# Patient Record
Sex: Female | Born: 1995 | Race: Black or African American | Hispanic: No | Marital: Single | Smoking: Current some day smoker
Health system: Southern US, Community
[De-identification: ages and names within clinical notes are randomized; demographics above are authoritative.]

## PROBLEM LIST (undated history)

## (undated) ENCOUNTER — Ambulatory Visit (HOSPITAL_COMMUNITY): Admission: EM | Disposition: A | Discharge status: Home / Self Care | Payer: Medicaid Other

## (undated) ENCOUNTER — Inpatient Hospital Stay (HOSPITAL_COMMUNITY): Payer: Self-pay

## (undated) ENCOUNTER — Emergency Department (HOSPITAL_COMMUNITY): Admission: EM | Payer: Medicaid Other | Source: Home / Self Care

## (undated) DIAGNOSIS — E236 Other disorders of pituitary gland: Secondary | ICD-10-CM

## (undated) DIAGNOSIS — D509 Iron deficiency anemia, unspecified: Secondary | ICD-10-CM

## (undated) DIAGNOSIS — B9689 Other specified bacterial agents as the cause of diseases classified elsewhere: Secondary | ICD-10-CM

## (undated) DIAGNOSIS — Z3480 Encounter for supervision of other normal pregnancy, unspecified trimester: Secondary | ICD-10-CM

## (undated) DIAGNOSIS — K219 Gastro-esophageal reflux disease without esophagitis: Secondary | ICD-10-CM

## (undated) DIAGNOSIS — N760 Acute vaginitis: Secondary | ICD-10-CM

## (undated) HISTORY — PX: WISDOM TOOTH EXTRACTION: SHX21

---
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
## 2014-06-22 ENCOUNTER — Inpatient Hospital Stay (HOSPITAL_COMMUNITY)
Admission: AD | Admit: 2014-06-22 | Discharge: 2014-06-22 | Disposition: A | Discharge status: Home / Self Care | Payer: Medicaid Other | Source: Ambulatory Visit | Attending: Obstetrics and Gynecology | Admitting: Obstetrics and Gynecology

## 2014-06-22 ENCOUNTER — Encounter (HOSPITAL_COMMUNITY): Payer: Self-pay | Admitting: *Deleted

## 2014-06-22 DIAGNOSIS — D649 Anemia, unspecified: Secondary | ICD-10-CM | POA: Diagnosis not present

## 2014-06-22 DIAGNOSIS — Z87891 Personal history of nicotine dependence: Secondary | ICD-10-CM | POA: Insufficient documentation

## 2014-06-22 DIAGNOSIS — N939 Abnormal uterine and vaginal bleeding, unspecified: Secondary | ICD-10-CM | POA: Diagnosis present

## 2014-06-22 DIAGNOSIS — Z975 Presence of (intrauterine) contraceptive device: Secondary | ICD-10-CM

## 2014-06-22 DIAGNOSIS — D509 Iron deficiency anemia, unspecified: Secondary | ICD-10-CM

## 2014-06-22 DIAGNOSIS — N921 Excessive and frequent menstruation with irregular cycle: Secondary | ICD-10-CM

## 2014-06-22 LAB — CBC WITH DIFFERENTIAL/PLATELET
Basophils Absolute: 0 10*3/uL (ref 0.0–0.1)
Basophils Relative: 0 % (ref 0–1)
Eosinophils Absolute: 0.5 10*3/uL (ref 0.0–0.7)
Eosinophils Relative: 7 % — ABNORMAL HIGH (ref 0–5)
HEMATOCRIT: 30.1 % — AB (ref 36.0–46.0)
Hemoglobin: 9.2 g/dL — ABNORMAL LOW (ref 12.0–15.0)
LYMPHS PCT: 29 % (ref 12–46)
Lymphs Abs: 2.1 10*3/uL (ref 0.7–4.0)
MCH: 22 pg — ABNORMAL LOW (ref 26.0–34.0)
MCHC: 30.6 g/dL (ref 30.0–36.0)
MCV: 72 fL — AB (ref 78.0–100.0)
Monocytes Absolute: 0.5 10*3/uL (ref 0.1–1.0)
Monocytes Relative: 7 % (ref 3–12)
Neutro Abs: 4.3 10*3/uL (ref 1.7–7.7)
Neutrophils Relative %: 57 % (ref 43–77)
PLATELETS: 360 10*3/uL (ref 150–400)
RBC: 4.18 MIL/uL (ref 3.87–5.11)
RDW: 16.5 % — ABNORMAL HIGH (ref 11.5–15.5)
WBC: 7.4 10*3/uL (ref 4.0–10.5)

## 2014-06-22 LAB — URINALYSIS, ROUTINE W REFLEX MICROSCOPIC
BILIRUBIN URINE: NEGATIVE
GLUCOSE, UA: NEGATIVE mg/dL
Hgb urine dipstick: NEGATIVE
KETONES UR: NEGATIVE mg/dL
Leukocytes, UA: NEGATIVE
Nitrite: NEGATIVE
Protein, ur: 30 mg/dL — AB
Specific Gravity, Urine: >1.03 — ABNORMAL HIGH (ref 1.005–1.030)
UROBILINOGEN UA: 1 mg/dL (ref 0.0–1.0)
pH: 6 (ref 5.0–8.0)

## 2014-06-22 LAB — URINE MICROSCOPIC-ADD ON

## 2014-06-22 LAB — POCT PREGNANCY, URINE: Preg Test, Ur: NEGATIVE

## 2014-06-22 NOTE — MAU Note (Signed)
Pt menses started a month ago, on and off. Changing 6 pads a day, saturated. Denies vomiting, some nausea. Denies pain.

## 2014-06-22 NOTE — MAU Note (Signed)
Pt feeling light head with headache.

## 2014-06-22 NOTE — Discharge Instructions (Signed)
Iron Deficiency Anemia Anemia is a condition in which there are less red blood cells or hemoglobin in the blood than normal. Hemoglobin is the part of red blood cells that carries oxygen. Iron deficiency anemia is anemia caused by too little iron. It is the most common type of anemia. It may leave you tired and short of breath. CAUSES   Lack of iron in the diet.  Poor absorption of iron, as seen with intestinal disorders.  Intestinal bleeding.  Heavy periods. SIGNS AND SYMPTOMS  Mild anemia may not be noticeable. Symptoms may include:  Fatigue.  Headache.  Pale skin.  Weakness.  Tiredness.  Shortness of breath.  Dizziness.  Cold hands and feet.  Fast or irregular heartbeat. DIAGNOSIS  Diagnosis requires a thorough evaluation and physical exam by your health care provider. Blood tests are generally used to confirm iron deficiency anemia. Additional tests may be done to find the underlying cause of your anemia. These may include:  Testing for blood in the stool (fecal occult blood test).  A procedure to see inside the colon and rectum (colonoscopy).  A procedure to see inside the esophagus and stomach (endoscopy). TREATMENT  Iron deficiency anemia is treated by correcting the cause of the deficiency. Treatment may involve:  Adding iron-rich foods to your diet.  Taking iron supplements. Pregnant or breastfeeding women need to take extra iron because their normal diet usually does not provide the required amount.  Taking vitamins. Vitamin C improves the absorption of iron. Your health care provider may recommend that you take your iron tablets with a glass of orange juice or vitamin C supplement.  Medicines to make heavy menstrual flow lighter.  Surgery. HOME CARE INSTRUCTIONS   Take iron as directed by your health care provider.  If you cannot tolerate taking iron supplements by mouth, talk to your health care provider about taking them through a vein  (intravenously) or an injection into a muscle.  For the best iron absorption, iron supplements should be taken on an empty stomach. If you cannot tolerate them on an empty stomach, you may need to take them with food.  Do not drink milk or take antacids at the same time as your iron supplements. Milk and antacids may interfere with the absorption of iron.  Iron supplements can cause constipation. Make sure to include fiber in your diet to prevent constipation. A stool softener may also be recommended.  Take vitamins as directed by your health care provider.  Eat a diet rich in iron. Foods high in iron include liver, lean beef, whole-grain bread, eggs, dried fruit, and dark green leafy vegetables. SEEK IMMEDIATE MEDICAL CARE IF:   You faint. If this happens, do not drive. Call your local emergency services (911 in [place] if no other help is available.  You have chest pain.  You feel nauseous or vomit.  You have severe or increased shortness of breath with activity.  You feel weak.  You have a rapid heartbeat.  You have unexplained sweating.  You become light-headed when getting up from a chair or bed. MAKE SURE YOU:   Understand these instructions.  Will watch your condition.  Will get help right away if you are not doing well or get worse. Document Released: 02/11/2000 Document Revised: 02/18/2013 Document Reviewed: 10/21/2012 Exit[employer] Patient Information 2015 [employer]. This information is not intended to replace advice given to you by your health care provider. Make sure you discuss any questions you have with your health care provider.  

## 2014-06-22 NOTE — MAU Provider Note (Signed)
History     CSN: [id number]  Arrival date and time: 06/22/14 9767   First Provider Initiated Contact with Patient 06/22/14 1853      Chief Complaint  Patient presents with  . Vaginal Bleeding  . Dizziness   HPI  [name] is a 19 y.o. G1P1001 who presents to MAU today with complaint of intermittent vaginal bleeding x 45 days. The patient states that she has had Nexplanon for birth control since her last pregnancy in 08/2013. She is not currently breastfeeding. She denies pain or LOC. She states intermittent dizziness, weakness and headache.   OB History    Gravida Para Term Preterm AB TAB SAB Ectopic Multiple Living   1 1 1  0 0 0 0 0 0 1      Past Medical History  Diagnosis Date  . GERD (gastroesophageal reflux disease)   . BV (bacterial vaginosis)   . Normal pregnancy 09/17/2013    Past Surgical History  Procedure Laterality Date  . No past surgeries    . Wisdom tooth extraction      Family History  Problem Relation Age of Onset  . Asthma Sister   . Hearing loss Neg Hx     History  Substance Use Topics  . Smoking status: Former Research scientist (life sciences)  . Smokeless tobacco: Never Used     Comment: quit 2011  . Alcohol Use: No    Allergies: No Known Allergies  No prescriptions prior to admission    Review of Systems  Constitutional: Negative for fever and malaise/fatigue.  Gastrointestinal: Negative for abdominal pain.  Genitourinary:       + vaginal bleeding  Neurological: Positive for headaches.   Physical Exam   Blood pressure 112/66, pulse 77, temperature 98.8 F (37.1 C), temperature source Oral, resp. rate 16, weight 139 lb (63.05 kg), last menstrual period 05/05/2014, unknown if currently breastfeeding.  Physical Exam  Constitutional: She is oriented to person, place, and time. She appears well-developed and well-nourished. No distress.  HENT:  Head: Normocephalic.  Cardiovascular: Normal rate.   Respiratory: Effort normal.  GI: Soft. She exhibits  no distension and no mass. There is no tenderness. There is no rebound and no guarding.  Neurological: She is alert and oriented to person, place, and time.  Skin: Skin is warm and dry. No erythema.  Psychiatric: She has a normal mood and affect.   Results for orders placed or performed during the hospital encounter of 06/22/14 (from the past 24 hour(s))  CBC with Differential/Platelet     Status: Abnormal   Collection Time: 06/22/14  7:00 PM  Result Value Ref Range   WBC 7.4 4.0 - 10.5 K/uL   RBC 4.18 3.87 - 5.11 MIL/uL   Hemoglobin 9.2 (L) 12.0 - 15.0 g/dL   HCT 30.1 (L) 36.0 - 46.0 %   MCV 72.0 (L) 78.0 - 100.0 fL   MCH 22.0 (L) 26.0 - 34.0 pg   MCHC 30.6 30.0 - 36.0 g/dL   RDW 16.5 (H) 11.5 - 15.5 %   Platelets 360 150 - 400 K/uL   Neutrophils Relative % 57 43 - 77 %   Lymphocytes Relative 29 12 - 46 %   Monocytes Relative 7 3 - 12 %   Eosinophils Relative 7 (H) 0 - 5 %   Basophils Relative 0 0 - 1 %   Neutro Abs 4.3 1.7 - 7.7 K/uL   Lymphs Abs 2.1 0.7 - 4.0 K/uL   Monocytes Absolute 0.5 0.1 -  1.0 K/uL   Eosinophils Absolute 0.5 0.0 - 0.7 K/uL   Basophils Absolute 0.0 0.0 - 0.1 K/uL   RBC Morphology TARGET CELLS   Pregnancy, urine POC     Status: None   Collection Time: 06/22/14  7:23 PM  Result Value Ref Range   Preg Test, Ur NEGATIVE NEGATIVE     MAU Course  Procedures None  MDM UPT - negative Discussed patient and lab results with Dr. Ulanda Edison. He recommends Ferrous Sulfat 325 mg BID - TID and follow-up in the office  Assessment and Plan  A: Breakthrough bleeding with Nexplanon Anemia  P: Discharge home Patient advised to start OTC Ferrous Sulfate BID-TID  Bleeding precautions discussed Patient advised to follow-up with [facility] to discuss change in birth control if unhappy with current bleeding profile Patient may return to MAU as needed or if her condition were to change or worsen   Luvenia Redden, PA-C  06/22/2014, 7:43 PM

## 2014-06-25 ENCOUNTER — Emergency Department (HOSPITAL_COMMUNITY)
Admission: EM | Admit: 2014-06-25 | Discharge: 2014-06-25 | Disposition: A | Discharge status: Home / Self Care | Payer: Medicaid Other | Attending: Emergency Medicine | Admitting: Emergency Medicine

## 2014-06-25 ENCOUNTER — Encounter (HOSPITAL_COMMUNITY): Payer: Self-pay

## 2014-06-25 DIAGNOSIS — Z87891 Personal history of nicotine dependence: Secondary | ICD-10-CM | POA: Insufficient documentation

## 2014-06-25 DIAGNOSIS — F439 Reaction to severe stress, unspecified: Secondary | ICD-10-CM | POA: Insufficient documentation

## 2014-06-25 DIAGNOSIS — Z8742 Personal history of other diseases of the female genital tract: Secondary | ICD-10-CM | POA: Insufficient documentation

## 2014-06-25 DIAGNOSIS — Z8719 Personal history of other diseases of the digestive system: Secondary | ICD-10-CM | POA: Diagnosis not present

## 2014-06-25 DIAGNOSIS — F410 Panic disorder [episodic paroxysmal anxiety] without agoraphobia: Secondary | ICD-10-CM | POA: Diagnosis present

## 2014-06-25 DIAGNOSIS — F430 Acute stress reaction: Secondary | ICD-10-CM

## 2014-06-25 MED ORDER — LORAZEPAM 1 MG PO TABS
1.0000 mg | ORAL_TABLET | Freq: Once | ORAL | Status: AC
  Administered 2014-06-25: 1 mg via ORAL
  Filled 2014-06-25: qty 1

## 2014-06-25 NOTE — ED Provider Notes (Signed)
CSN: [id number]     Arrival date & time 06/25/14  1718 History   First MD Initiated Contact with Patient 06/25/14 1837     Chief Complaint  Patient presents with  . Panic Attack     (Consider location/radiation/quality/duration/timing/severity/associated sxs/prior Treatment) HPI    PCP: No PCP Per Patient Blood pressure 116/67, pulse 99, temperature 98.2 F (36.8 C), temperature source Oral, resp. rate 18, height 5\' 6"  (1.676 m), weight 139 lb (63.05 kg), last menstrual period 05/05/2014, SpO2 99 %, unknown if currently breastfeeding.  Kimberly Gibson is a 19 y.o.female with a significant PMH of GERD, BV, normal pregnancy 9 months ago  presents to the ER with complaints of panic attacks. 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 She admits to not sleeping very much, eating well or having a break. She does have support from her family members and her significant other but admits that she never lets them help. 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 Once she was home she laid on the cough and her significant other was concerned because she was hyperventilating and unable to verbally respond to his questions. She reports that this is genetic as her mom has panic attacks as well as her sister. The patient was given Ativan in triage and now feels tired but "much better" after having calmed down.   Negative Review of Symptoms: CP, SOB, confusion, lower extremity swelling, nausea, vomiting, diarrhea, cough, abdominal pains, change in vision.      Past Medical History  Diagnosis Date  . GERD (gastroesophageal reflux disease)   . BV (bacterial vaginosis)   . Normal pregnancy 09/17/2013   Past Surgical History  Procedure Laterality Date  . No past surgeries    . Wisdom tooth extraction     Family History  Problem Relation Age of Onset  . Asthma Sister   . Hearing loss Neg Hx    History   Substance Use Topics  . Smoking status: Former Research scientist (life sciences)  . Smokeless tobacco: Never Used     Comment: quit 2011  . Alcohol Use: No   OB History    Gravida Para Term Preterm AB TAB SAB Ectopic Multiple Living   1 1 1  0 0 0 0 0 0 1     Review of Systems  ROS: No TIA's or unusual headaches, no dysphagia.  No prolonged cough. No dyspnea or chest pain on exertion.  No abdominal pain, change in bowel habits, black or bloody stools.  No urinary tract symptoms.  No new or unusual musculoskeletal symptoms.  Normal menses, no abnormal vaginal bleeding, discharge or unexpected pelvic pain. No new breast lumps, breast pain or nipple discharge.   Allergies  Review of patient's allergies indicates no known allergies.  Home Medications   Prior to Admission medications   Medication Sig Start Date End Date Taking? Authorizing Provider  diphenhydrAMINE (BENADRYL) 25 MG tablet Take 25 mg by mouth at bedtime as needed for sleep.    Historical Provider, MD  ferrous sulfate 325 (65 FE) MG tablet Take 1 tablet (325 mg total) by mouth 2 (two) times daily with a meal. Patient not taking: Reported on 06/22/2014 09/20/13   Newton Pigg, MD  ibuprofen (ADVIL,MOTRIN) 600 MG tablet Take 1 tablet (600 mg total) by mouth every 6 (six) hours as needed. Patient not taking: Reported on 06/22/2014 09/20/13   Newton Pigg, MD  MELATONIN PO Take 1 tablet by mouth as needed (sleep).    Historical Provider, MD  oxyCODONE-acetaminophen (PERCOCET/ROXICET) 5-325 MG per tablet Take 1 tablet by mouth every 6 (six) hours as needed for severe pain (moderate - severe pain). Patient not taking: Reported on 06/22/2014 09/20/13   Newton Pigg, MD   BP 116/67 mmHg  Pulse 99  Temp(Src) 98.2 F (36.8 C) (Oral)  Resp 18  Ht 5\' 6"  (1.676 m)  Wt 139 lb (63.05 kg)  BMI 22.45 kg/m2  SpO2 99%  LMP 05/05/2014 (Approximate) Physical Exam  Constitutional: She appears well-developed and well-nourished. No distress.  HENT:  Head:  Normocephalic and atraumatic.  Eyes: Pupils are equal, round, and reactive to light.  Neck: Normal range of motion. Neck supple.  Cardiovascular: Normal rate and regular rhythm.   Pulmonary/Chest: Effort normal.  Abdominal: Soft.  Neurological: She is alert.  Skin: Skin is warm and dry.  Psychiatric: Her mood appears anxious. She is not actively hallucinating. She expresses no homicidal and no suicidal ideation.  Nursing note and vitals reviewed.   ED Course  Procedures (including critical care time) Labs Review Labs Reviewed - No data to display  Imaging Review No results found.   EKG Interpretation None      MDM   Final diagnoses:  Panic attack as reaction to stress   The patient has her significant other and two sisters in the room with her who appear to be very supportive and want to help. The patient is receptive but says she pushes herself because she does not want her child to want for anything. She works at [employer] and has to work a lot of extra shifts to make enough money for what he needs. She admits to not eating or sleeping well.  No SI/HI, no hallucinations or substance abuse concerns. She will be given outpatient resources and referrals. Patient and family members are agreeable to patient going home.  19 y.o.Kimberly Gibson's evaluation in the Emergency Department is complete. It has been determined that no acute conditions requiring further emergency intervention are present at this time. The patient/guardian have been advised of the diagnosis and plan. We have discussed signs and symptoms that warrant return to the ED, such as changes or worsening in symptoms.  Vital signs are stable at discharge. Filed Vitals:   06/25/14 2028  BP: 116/67  Pulse: 99  Temp:   Resp: 18    Patient/guardian has voiced understanding and agreed to follow-up with the PCP or specialist.     Delos Haring, PA-C 06/25/14 2039  Merryl Hacker, MD 06/26/14 1931

## 2014-06-25 NOTE — ED Notes (Signed)
Per EMS, pt appears to have had a panic attack. No trigger.  No hx of same.  Pt states she has not felt well.  Pt has had recently dx anemia and was started on iron.  Pt boyfriend was present during event.  Stated she just started "freaking out".  Denies LOC.  Pt symptoms are of twitching, some verbalization, anxiety.  Vitals:  120 80, hr 80, resp 24 once calmed, 100% ra.  Pt c/o headache.  No seizure activity or postictal noted.

## 2014-06-25 NOTE — ED Notes (Signed)
Bed: WA22 Expected date:  Expected time:  Means of arrival:  Comments: Triage 1  

## 2014-06-25 NOTE — ED Notes (Signed)
Pt reports that she feels better than she did when she came in, asked about her anxiety, she states "I feel much better." Pt received 1 mg of Ativan on arrival.

## 2014-06-25 NOTE — Discharge Instructions (Signed)
Stress and Stress Management °Stress is a normal reaction to life events. It is what you feel when life demands more than you are used to or more than you can handle. Some stress can be useful. For example, the stress reaction can help you catch the last bus of the day, study for a test, or meet a deadline at work. But stress that occurs too often or for too long can cause problems. It can affect your emotional health and interfere with relationships and normal daily activities. Too much stress can weaken your immune system and increase your risk for physical illness. If you already have a medical problem, stress can make it worse. °CAUSES  °All sorts of life events may cause stress. An event that causes stress for one person may not be stressful for another person. Major life events commonly cause stress. These may be positive or negative. Examples include losing your job, moving into a new home, getting married, having a baby, or losing a loved one. Less obvious life events may also cause stress, especially if they occur day after day or in combination. Examples include working long hours, driving in traffic, caring for children, being in debt, or being in a difficult relationship. °SIGNS AND SYMPTOMS °Stress may cause emotional symptoms including, the following: °· Anxiety. This is feeling worried, afraid, on edge, overwhelmed, or out of control. °· Anger. This is feeling irritated or impatient. °· Depression. This is feeling sad, down, helpless, or guilty. °· Difficulty focusing, remembering, or making decisions. °Stress may cause physical symptoms, including the following:  °· Aches and pains. These may affect your head, neck, back, stomach, or other areas of your body. °· Tight muscles or clenched jaw. °· Low energy or trouble sleeping.  °Stress may cause unhealthy behaviors, including the following:  °· Eating to feel better (overeating) or skipping meals. °· Sleeping too little, too much, or both. °· Working  too much or putting off tasks (procrastination). °· Smoking, drinking alcohol, or using drugs to feel better. °DIAGNOSIS  °Stress is diagnosed through an assessment by your health care provider. Your health care provider will ask questions about your symptoms and any stressful life events. Your health care provider will also ask about your medical history and may order blood tests or other tests. Certain medical conditions and medicine can cause physical symptoms similar to stress.  Mental illness can cause emotional symptoms and unhealthy behaviors similar to stress. Your health care provider may refer you to a mental health professional for further evaluation.  °TREATMENT  °Stress management is the recommended treatment for stress. The goals of stress management are reducing stressful life events and coping with stress in healthy ways.  °Techniques for reducing stressful life events include the following: °· Stress identification. Self-monitor for stress and identify what causes stress for you. These skills may help you to avoid some stressful events. °· Time management. Set your priorities, keep a calendar of events, and learn to say "no." These tools can help you avoid making too many commitments. °Techniques for coping with stress include the following: °· Rethinking the problem. Try to think realistically about stressful events rather than ignoring them or overreacting. Try to find the positives in a stressful situation rather than focusing on the negatives. °· Exercise. Physical exercise can release both physical and emotional tension. The key is to find a form of exercise you enjoy and do it regularly. °· Relaxation techniques. These relax the body and mind. Examples include yoga, meditation, tai chi, biofeedback, deep   breathing, progressive muscle relaxation, listening to music, being out in nature, journaling, and other hobbies. Again, the key is to find one or more that you enjoy and can do  regularly.  Healthy lifestyle. Eat a balanced diet, get plenty of sleep, and do not smoke. Avoid using alcohol or drugs to relax.  Strong support network. Spend time with family, friends, or other people you enjoy being around.Express your feelings and talk things over with someone you trust. Counseling or talktherapy with a mental health professional may be helpful if you are having difficulty managing stress on your own. Medicine is typically not recommended for the treatment of stress.Talk to your health care provider if you think you need medicine for symptoms of stress. HOME CARE INSTRUCTIONS  Keep all follow-up visits as directed by your health care provider.  Take all medicines as directed by your health care provider. SEEK MEDICAL CARE IF:  Your symptoms get worse or you start having new symptoms.  You feel overwhelmed by your problems and can no longer manage them on your own. SEEK IMMEDIATE MEDICAL CARE IF:  You feel like hurting yourself or someone else. Document Released: 08/09/2000 Document Revised: 06/30/2013 Document Reviewed: 10/08/2012 [facility] Patient Information 2015 [place], [place]. This information is not intended to replace advice given to you by your health care provider. Make sure you discuss any questions you have with your health care provider.  Panic Attacks Panic attacks are sudden, short-livedsurges of severe anxiety, fear, or discomfort. They may occur for no reason when you are relaxed, when you are anxious, or when you are sleeping. Panic attacks may occur for a number of reasons:   Healthy people occasionally have panic attacks in extreme, life-threatening situations, such as war or natural disasters. Normal anxiety is a protective mechanism of the body that helps [place] react to danger (fight or flight response).  Panic attacks are often seen with anxiety disorders, such as panic disorder, social anxiety disorder, generalized anxiety disorder, and phobias.  Anxiety disorders cause excessive or uncontrollable anxiety. They may interfere with your relationships or other life activities.  Panic attacks are sometimes seen with other mental illnesses, such as depression and posttraumatic stress disorder.  Certain medical conditions, prescription medicines, and drugs of abuse can cause panic attacks. SYMPTOMS  Panic attacks start suddenly, peak within 20 minutes, and are accompanied by four or more of the following symptoms:  Pounding heart or fast heart rate (palpitations).  Sweating.  Trembling or shaking.  Shortness of breath or feeling smothered.  Feeling choked.  Chest pain or discomfort.  Nausea or strange feeling in your stomach.  Dizziness, light-headedness, or feeling like you will faint.  Chills or hot flushes.  Numbness or tingling in your lips or hands and feet.  Feeling that things are not real or feeling that you are not yourself.  Fear of losing control or going crazy.  Fear of dying. Some of these symptoms can mimic serious medical conditions. For example, you may think you are having a heart attack. Although panic attacks can be very scary, they are not life threatening. DIAGNOSIS  Panic attacks are diagnosed through an assessment by your health care provider. Your health care provider will ask questions about your symptoms, such as where and when they occurred. Your health care provider will also ask about your medical history and use of alcohol and drugs, including prescription medicines. Your health care provider may order blood tests or other studies to rule out a serious medical condition.  Your health care provider may refer you to a mental health professional for further evaluation. TREATMENT   Most healthy people who have one or two panic attacks in an extreme, life-threatening situation will not require treatment.  The treatment for panic attacks associated with anxiety disorders or other mental illness  typically involves counseling with a mental health professional, medicine, or a combination of both. Your health care provider will help determine what treatment is best for you.  Panic attacks due to physical illness usually go away with treatment of the illness. If prescription medicine is causing panic attacks, talk with your health care provider about stopping the medicine, decreasing the dose, or substituting another medicine.  Panic attacks due to alcohol or drug abuse go away with abstinence. Some adults need professional help in order to stop drinking or using drugs. HOME CARE INSTRUCTIONS   Take all medicines as directed by your health care provider.   Schedule and attend follow-up visits as directed by your health care provider. It is important to keep all your appointments. SEEK MEDICAL CARE IF:  You are not able to take your medicines as prescribed.  Your symptoms do not improve or get worse. SEEK IMMEDIATE MEDICAL CARE IF:   You experience panic attack symptoms that are different than your usual symptoms.  You have serious thoughts about hurting yourself or others.  You are taking medicine for panic attacks and have a serious side effect. MAKE SURE YOU:  Understand these instructions.  Will watch your condition.  Will get help right away if you are not doing well or get worse. Document Released: 02/13/2005 Document Revised: 02/18/2013 Document Reviewed: 09/27/2012 [facility] Patient Information 2015 [place], [place]. This information is not intended to replace advice given to you by your health care provider. Make sure you discuss any questions you have with your health care provider.  RESOURCE GUIDE  Chronic Pain Problems: Contact [facility]  [phone] Patients need to be referred by their primary care doctor.  Insufficient Money for Medicine: Contact [employer]:  call "211" or [place] [phone].  No Primary Care Doctor: Call  [facility] Connect  [phone] - can help you locate a primary care doctor that  accepts your insurance, provides certain services, etc. Physician Referral Service- [phone]  Agencies that provide inexpensive medical care: [facility]  [facility]  [phone] [facility]  [phone] [facility]  [phone] Planned Parenthood  [phone] [facility]  [phone]  [place] Providers: [facility]- [address]  [phone], Mon-Fri 9am-7pm, Sat 9am-1pm [place], [address] [place]  [place], [address] [place]  [place]- [address]  [place], [address], [phone]  Only accepts [place] patients after they have their name  applied to their card  Self Pay (no insurance) in [facility]: Sickle Cell Patients: Dr Kevan Ny, [facility]  [place], [facility]- [facility] [place]- [address], [facility]- see information above (Speak to [employer] if you do not have insurance)       -  [facility]- [place], [place],  [place], [place]  Dr Vista Lawman-  [address], [place], [facility]- [address] [phone]       -  [facility] [place]- [address], [place], also [address], [phone]       -    [facility]- [address], [place], 1st & 3rd Saturday   every month, 10am-1pm  1) Find a Doctor and Pay Out of Pocket Although you won't have to find out who  is covered by your insurance plan, it is a good idea to ask around and get recommendations. You will then need to call the office and see if the doctor you have chosen will accept you as a new patient and what types of options they offer for patients who are self-pay. Some doctors offer discounts or will set up payment plans for their patients who do not have insurance, but you will need to ask so you aren't surprised when you get to your appointment.  2) Contact Your Local Health Department Not all health departments have doctors that can see patients for sick visits, but many do, so it is worth a call to see if yours does. If you don't know where your local health department is, you can check in your phone book. The CDC also has a tool to help you locate your state's health department, and many state websites also have listings of all of their local health departments.  3) Find a [facility] If your illness is not likely to be very severe or complicated, you may want to try a walk in clinic. These are popping up all over the country in pharmacies, drugstores, and shopping centers. They're usually staffed by nurse practitioners or physician assistants that have been trained to treat common illnesses and complaints. They're usually fairly quick and inexpensive. However, if you have serious medical issues or chronic medical problems, these are probably not your best option  [facility] [place], [facility], [address] [place], phone [phone] or [phone].  Monday - Friday, call for an appointment. [place], [facility], [address], [place], phone [phone] or [phone].  Monday - Friday, call for an appointment.  Abuse/Neglect: [place] [phone] [place] [phone] (After Hours)  Emergency Shelter:  [facility] Ever[facility]  Ministries [phone]  Maternity Homes: Room at the [place] [phone] [place] [phone]  MRSA Hotline #:   620-447-9230  [facility] Dept. [address]         [place]  Sela Hua Phone:  [phone]                                  Phone:  [phone]                   Phone:  [place], [place]- [phone]       -     [facility] in [place], [address]                                  [phone], [place] [phone] or [phone] (After Hours)   [place]  Substance Abuse Resources: Alcohol and Drug Services  [phone] [place] [phone] [facility]a Pester [phone] Residential & Outpatient Substance Abuse Program  [phone]  Psychological Services: [place]  [phone] [place]  [place], [place]. [address] [place], [place]: [phone] or [phone], [email or web address]  Dental Assistance  If unable to pay or uninsured, contact:  [facility] or [facility]. to become qualified for the adult dental clinic.  Patients with Medicaid: [facility] [phone], [place] [address], [phone]  If unable to pay, or uninsured, contact [facility] [phone]) or [place] [phone] in [place], [place] in [facility]) to become qualified for the adult dental clinic  Other [place]- [place], [place], [place], [address], [place], [place], 2nd and 4th Thursday of the month at 6:30am.  10 clients each day by appointment, can sometimes see walk-in patients if someone does not show for an appointment. [facility]- [address] Hillard Danker [place], [place], [address], [place], [place], [place], [address], [place] [phone]  Please make every effort to establish with a primary care physician for routine medical care  [place]  The [place] provides a wide range of adult health services. Some of these services are designed to address the healthcare needs of all [facility] residents and all services are designed to meet the needs of uninsured/underinsured low income residents. Some services are available to any resident of [place], call [phone] for details. ] [facility], a new medical clinic for adults, is now open. For more information about the Center and its services please call [phone]. For information on our [place] services, click here.  For more information on any of the following Department of Public Health programs, including hours of service, click on the highlighted link.  SERVICES FOR WOMEN (Adults and Teens) [employer] provide a full range of birth control options plus education and counseling. New patient visit and annual return visits include a complete examination, pap test as indicated, and other laboratory as indicated. Included is our [employer] for men.  Maternity Care is provided through pregnancy, including a six week post partum  exam. Women who meet eligibility criteria for the Medicaid for Pregnant Women program, receive care  free. Other women are charged on a sliding scale according to income. Note: [facility] provides services to pregnant women who have a Medicaid card. Call [phone] for an appointment in [place] or [phone] for an appointment in [facility].  Primary Care for Medicaid [place] Access Women is available through the [place]. As primary care provider for the [place] program, women may designate the [facility] clinic as their primary care provider.  PLEASE CALL [phone] FOR AN APPOINTMENT FOR THE ABOVE SERVICES IN EITHER [place] OR [place]. Information available in [place] and [place].   Childbirth Education Classes are open to the public and offered to help families prepare for the best possible childbirth experience as well as to promote lifelong health and wellness. Classes are offered throughout the year and meet on the same night once a week for five weeks. Medicaid covers the cost of the classes for the mother-to-be and her partner. For participants without Medicaid, the cost of the class series is $45.00 for the mother-to-be and her partner. Class size is limited and registration is required. For more information or to register call [phone]. Baby items donated by [employer] and the [employer] [place] are given away during each class series.  SERVICES FOR WOMEN AND MEN Sexually Transmitted Infection appointments, including HIV testing, are available daily (weekdays, except holidays). Call early as same-day appointments are limited. For an appointment in either [facility] or [place], call [phone]. Services are confidential and free of charge.  Skin Testing for Tuberculosis Please call [phone]. Adult Immunizations are available, usually for a fee. Please call [phone] for details.  PLEASE CALL [phone] FOR AN APPOINTMENT FOR THE ABOVE SERVICES IN EITHER [place] OR [place].   [facility] provides up to the minute recommended vaccines for your travel destination. We also provide essential health and political information to help insure a safe and pleasurable travel experience. This program is self-sustaining, however, fees are very competitive. We are a CERTIFIED YELLOW FEVER IMMUNIZATION approved clinic site. PLEASE CALL [phone] FOR AN APPOINTMENT IN EITHER [place] OR [place].   If you have questions about the services listed above, we want to answer them! Email [place] at: [email or web address] Home Visiting Services for elderly and the disabled are available to residents of [facility] who are in need of care that compares to the care offered by a nursing home, have needs that can be met by the program, and have [place] Medicaid. Other short term services are available to residents 18 years and older who are unable to meet requirements for eligibility to receive services from a certified home health agency, spend the majority of time at home, and need care for six months or less.  PLEASE CALL [id number] OR [phone] FOR MORE INFORMATION. Medication Assistance Program serves as a link between pharmaceutical companies and patients to provide low cost or free prescription medications. This servce is available for residents who meet certain income restrictions and have no insurance coverage.  PLEASE CALL [phone] ([place]) OR [phone] ([place]) FOR MORE INFORMATION.  Updated Feb. 21, 2013

## 2014-12-23 ENCOUNTER — Encounter (HOSPITAL_COMMUNITY): Payer: Self-pay | Admitting: *Deleted

## 2014-12-23 ENCOUNTER — Emergency Department (HOSPITAL_COMMUNITY): Payer: Medicaid Other

## 2014-12-23 ENCOUNTER — Emergency Department (HOSPITAL_COMMUNITY)
Admission: EM | Admit: 2014-12-23 | Discharge: 2014-12-23 | Disposition: A | Discharge status: Home / Self Care | Payer: Medicaid Other | Attending: Emergency Medicine | Admitting: Emergency Medicine

## 2014-12-23 DIAGNOSIS — M545 Low back pain: Secondary | ICD-10-CM | POA: Diagnosis not present

## 2014-12-23 DIAGNOSIS — Z3202 Encounter for pregnancy test, result negative: Secondary | ICD-10-CM | POA: Diagnosis not present

## 2014-12-23 DIAGNOSIS — Z8719 Personal history of other diseases of the digestive system: Secondary | ICD-10-CM | POA: Insufficient documentation

## 2014-12-23 DIAGNOSIS — Z8742 Personal history of other diseases of the female genital tract: Secondary | ICD-10-CM | POA: Diagnosis not present

## 2014-12-23 DIAGNOSIS — Z87891 Personal history of nicotine dependence: Secondary | ICD-10-CM | POA: Insufficient documentation

## 2014-12-23 DIAGNOSIS — R079 Chest pain, unspecified: Secondary | ICD-10-CM

## 2014-12-23 DIAGNOSIS — R1084 Generalized abdominal pain: Secondary | ICD-10-CM | POA: Insufficient documentation

## 2014-12-23 DIAGNOSIS — R51 Headache: Secondary | ICD-10-CM | POA: Diagnosis not present

## 2014-12-23 DIAGNOSIS — R519 Headache, unspecified: Secondary | ICD-10-CM

## 2014-12-23 LAB — URINALYSIS, ROUTINE W REFLEX MICROSCOPIC
Bilirubin Urine: NEGATIVE
Glucose, UA: NEGATIVE mg/dL
HGB URINE DIPSTICK: NEGATIVE
Ketones, ur: NEGATIVE mg/dL
Leukocytes, UA: NEGATIVE
Nitrite: NEGATIVE
PROTEIN: NEGATIVE mg/dL
Specific Gravity, Urine: 1.027 (ref 1.005–1.030)
Urobilinogen, UA: 1 mg/dL (ref 0.0–1.0)
pH: 7.5 (ref 5.0–8.0)

## 2014-12-23 LAB — COMPREHENSIVE METABOLIC PANEL
ALBUMIN: 3.4 g/dL — AB (ref 3.5–5.0)
ALT: 10 U/L — ABNORMAL LOW (ref 14–54)
ANION GAP: 7 (ref 5–15)
AST: 20 U/L (ref 15–41)
Alkaline Phosphatase: 71 U/L (ref 38–126)
BUN: 9 mg/dL (ref 6–20)
CALCIUM: 9.2 mg/dL (ref 8.9–10.3)
CO2: 26 mmol/L (ref 22–32)
CREATININE: 0.62 mg/dL (ref 0.44–1.00)
Chloride: 107 mmol/L (ref 101–111)
GFR calc non Af Amer: >60 mL/min (ref >60)
Glucose, Bld: 117 mg/dL — ABNORMAL HIGH (ref 65–99)
POTASSIUM: 3.7 mmol/L (ref 3.5–5.1)
Sodium: 140 mmol/L (ref 135–145)
Total Bilirubin: 0.3 mg/dL (ref 0.3–1.2)
Total Protein: 7.3 g/dL (ref 6.5–8.1)

## 2014-12-23 LAB — URINE MICROSCOPIC-ADD ON

## 2014-12-23 LAB — CBC
HCT: 30.7 % — ABNORMAL LOW (ref 36.0–46.0)
HEMOGLOBIN: 9.3 g/dL — AB (ref 12.0–15.0)
MCH: 21.4 pg — AB (ref 26.0–34.0)
MCHC: 30.3 g/dL (ref 30.0–36.0)
MCV: 70.6 fL — ABNORMAL LOW (ref 78.0–100.0)
PLATELETS: 427 10*3/uL — AB (ref 150–400)
RBC: 4.35 MIL/uL (ref 3.87–5.11)
RDW: 16 % — AB (ref 11.5–15.5)
WBC: 8.4 10*3/uL (ref 4.0–10.5)

## 2014-12-23 LAB — I-STAT BETA HCG BLOOD, ED (MC, WL, AP ONLY): I-stat hCG, quantitative: <5 m[IU]/mL (ref <5)

## 2014-12-23 LAB — LIPASE, BLOOD: Lipase: 33 U/L (ref 11–51)

## 2014-12-23 LAB — D-DIMER, QUANTITATIVE: D-Dimer, Quant: 0.27 ug/mL-FEU (ref 0.00–0.48)

## 2014-12-23 NOTE — ED Notes (Signed)
Completed EKG, unable to find Dr to hand EKG off too. Tech Katie will hand off EKG.

## 2014-12-23 NOTE — ED Notes (Signed)
Pt has multiple complaints. Reports not feeling well x 2 months. Having chest pains, lower back pain, abd pain, nausea, headaches, bodyaches. Denies diarrhea, vomiting or recent cough. No acute distress noted at triage.

## 2014-12-23 NOTE — Discharge Instructions (Signed)
Abdominal Pain, Adult Many things can cause abdominal pain. Usually, abdominal pain is not caused by a disease and will improve without treatment. It can often be observed and treated at home. Your health care provider will do a physical exam and possibly order blood tests and X-rays to help determine the seriousness of your pain. However, in many cases, more time must pass before a clear cause of the pain can be found. Before that point, your health care provider may not know if you need more testing or further treatment. HOME CARE INSTRUCTIONS Monitor your abdominal pain for any changes. The following actions may help to alleviate any discomfort you are experiencing:  Only take over-the-counter or prescription medicines as directed by your health care provider.  Do not take laxatives unless directed to do so by your health care provider.  Try a clear liquid diet (broth, tea, or water) as directed by your health care provider. Slowly move to a bland diet as tolerated. SEEK MEDICAL CARE IF:  You have unexplained abdominal pain.  You have abdominal pain associated with nausea or diarrhea.  You have pain when you urinate or have a bowel movement.  You experience abdominal pain that wakes you in the night.  You have abdominal pain that is worsened or improved by eating food.  You have abdominal pain that is worsened with eating fatty foods.  You have a fever. SEEK IMMEDIATE MEDICAL CARE IF:  Your pain does not go away within 2 hours.  You keep throwing up (vomiting).  Your pain is felt only in portions of the abdomen, such as the right side or the left lower portion of the abdomen.  You pass bloody or black tarry stools. MAKE SURE YOU:  Understand these instructions.  Will watch your condition.  Will get help right away if you are not doing well or get worse.   This information is not intended to replace advice given to you by your health care provider. Make sure you discuss  any questions you have with your health care provider.   Document Released: 11/23/2004 Document Revised: 11/04/2014 Document Reviewed: 10/23/2012 Elsevier Interactive Patient Education 2016 Elsevier [employer]  Chest Wall Pain Chest wall pain is pain in or around the bones and muscles of your chest. Sometimes, an injury causes this pain. Sometimes, the cause may not be known. This pain may take several weeks or longer to get better. HOME CARE INSTRUCTIONS  Pay attention to any changes in your symptoms. Take these actions to help with your pain:   Rest as told by your health care provider.   Avoid activities that cause pain. These include any activities that use your chest muscles or your abdominal and side muscles to lift heavy items.   If directed, apply ice to the painful area:  Put ice in a plastic bag.  Place a towel between your skin and the bag.  Leave the ice on for 20 minutes, 2-3 times per day.  Take over-the-counter and prescription medicines only as told by your health care provider.  Do not use tobacco products, including cigarettes, chewing tobacco, and e-cigarettes. If you need help quitting, ask your health care provider.  Keep all follow-up visits as told by your health care provider. This is important. SEEK MEDICAL CARE IF:  You have a fever.  Your chest pain becomes worse.  You have new symptoms. SEEK IMMEDIATE MEDICAL CARE IF:  You have nausea or vomiting.  You feel sweaty or light-headed.  You have  a cough with phlegm (sputum) or you cough up blood.  You develop shortness of breath.   This information is not intended to replace advice given to you by your health care provider. Make sure you discuss any questions you have with your health care provider.   Document Released: 02/13/2005 Document Revised: 11/04/2014 Document Reviewed: 05/11/2014 Elsevier Interactive Patient Education 2016 [place] Headache Without Cause A headache is pain  or discomfort felt around the head or neck area. The specific cause of a headache may not be found. There are many causes and types of headaches. A few common ones are:  Tension headaches.  Migraine headaches.  Cluster headaches.  Chronic daily headaches. HOME CARE INSTRUCTIONS  Watch your condition for any changes. Take these steps to help with your condition: Managing Pain  Take over-the-counter and prescription medicines only as told by your health care provider.  Lie down in a dark, quiet room when you have a headache.  If directed, apply ice to the head and neck area:  Put ice in a plastic bag.  Place a towel between your skin and the bag.  Leave the ice on for 20 minutes, 2-3 times per day.  Use a heating pad or hot shower to apply heat to the head and neck area as told by your health care provider.  Keep lights dim if bright lights bother you or make your headaches worse. Eating and Drinking  Eat meals on a regular schedule.  Limit alcohol use.  Decrease the amount of caffeine you drink, or stop drinking caffeine. General Instructions  Keep all follow-up visits as told by your health care provider. This is important.  Keep a headache journal to help find out what may trigger your headaches. For example, write down:  What you eat and drink.  How much sleep you get.  Any change to your diet or medicines.  Try massage or other relaxation techniques.  Limit stress.  Sit up straight, and do not tense your muscles.  Do not use tobacco products, including cigarettes, chewing tobacco, or e-cigarettes. If you need help quitting, ask your health care provider.  Exercise regularly as told by your health care provider.  Sleep on a regular schedule. Get 7-9 hours of sleep, or the amount recommended by your health care provider. SEEK MEDICAL CARE IF:   Your symptoms are not helped by medicine.  You have a headache that is different from the usual  headache.  You have nausea or you vomit.  You have a fever. SEEK IMMEDIATE MEDICAL CARE IF:   Your headache becomes severe.  You have repeated vomiting.  You have a stiff neck.  You have a loss of vision.  You have problems with speech.  You have pain in the eye or ear.  You have muscular weakness or loss of muscle control.  You lose your balance or have trouble walking.  You feel faint or pass out.  You have confusion.   This information is not intended to replace advice given to you by your health care provider. Make sure you discuss any questions you have with your health care provider.   Document Released: 02/13/2005 Document Revised: 11/04/2014 Document Reviewed: 06/08/2014 Elsevier Interactive Patient Education [employer].

## 2014-12-23 NOTE — ED Provider Notes (Signed)
Arrival Date & Time: 12/23/14 & 1608  History   Chief Complaint  Patient presents with  . Abdominal Pain  . Headache  . Back Pain   HPI Kimberly Gibson is a 19 y.o. female with a chief complaint of Abdominal Pain; Headache; and Back Pain  States abdominal pain that is described as generalized and without emesis or diarrhea. Abdominal pain is localized to umbilicus at this time the headache has been present for 2 days associated with sudden onset of symptoms nor are symptoms worsen onset. Patient has no constitutional symptoms such as fevers or chills or neck pain and no increased sensitivity to light. Patient states she has intermittent stabbing chest pain.    Past Medical History  I reviewed & agree with nursing's documentation on PMHx, PSHx, SHx and FHx. Past Medical History  Diagnosis Date  . GERD (gastroesophageal reflux disease)   . BV (bacterial vaginosis)   . Normal pregnancy 09/17/2013   Past Surgical History  Procedure Laterality Date  . No past surgeries    . Wisdom tooth extraction     Social History   Social History  . Marital Status: Single    Spouse Name: N/A  . Number of Children: N/A  . Years of Education: N/A   Social History Main Topics  . Smoking status: Former Research scientist (life sciences)  . Smokeless tobacco: Never Used     Comment: quit 2011  . Alcohol Use: No  . Drug Use: No  . Sexual Activity: Yes    Birth Control/ Protection: Implant   Other Topics Concern  . None   Social History Narrative   Family History  Problem Relation Age of Onset  . Asthma Sister   . Hearing loss Neg Hx     Review of Systems  Complete ROS obtained and pertinent positive and negatives documented above in HPI. All other ROS negative.  Allergies  Review of patient's allergies indicates no known allergies.  Home Medications   Prior to Admission medications   Medication Sig Start Date End Date Taking? Authorizing Provider  ferrous sulfate 325 (65 FE) MG tablet Take 1 tablet (325 mg  total) by mouth 2 (two) times daily with a meal. Patient not taking: Reported on 06/22/2014 09/20/13   Newton Pigg, MD  ibuprofen (ADVIL,MOTRIN) 600 MG tablet Take 1 tablet (600 mg total) by mouth every 6 (six) hours as needed. Patient not taking: Reported on 06/22/2014 09/20/13   Newton Pigg, MD  oxyCODONE-acetaminophen (PERCOCET/ROXICET) 5-325 MG per tablet Take 1 tablet by mouth every 6 (six) hours as needed for severe pain (moderate - severe pain). Patient not taking: Reported on 06/22/2014 09/20/13   Newton Pigg, MD    Physical Exam  BP 117/62 mmHg  Pulse 104  Temp(Src) 98.6 F (37 C) (Oral)  Resp 24  SpO2 100% Physical Exam  Constitutional: She is oriented to person, place, and time. She appears well-developed and well-nourished.  Non-toxic appearance. She does not appear ill. No distress.  HENT:  Head: Normocephalic and atraumatic.  Right Ear: External ear normal.  Left Ear: External ear normal.  Eyes: Pupils are equal, round, and reactive to light. No scleral icterus.  Neck: Normal range of motion. Neck supple. No tracheal deviation present.  Cardiovascular: Normal heart sounds and intact distal pulses.   No murmur heard. Pulmonary/Chest: Effort normal and breath sounds normal. No stridor. No respiratory distress. She has no wheezes. She has no rales.  Abdominal: Soft. Bowel sounds are normal. She exhibits no distension. There  is no tenderness. There is no rebound and no guarding.  Musculoskeletal: Normal range of motion.  Neurological: She is alert and oriented to person, place, and time. She has normal strength and normal reflexes. No cranial nerve deficit or sensory deficit.  Skin: Skin is warm and dry. No pallor.  Psychiatric: She has a normal mood and affect. Her behavior is normal.    ED Course  Procedures Labs Review Labs Reviewed  COMPREHENSIVE METABOLIC PANEL - Abnormal; Notable for the following:    Glucose, Bld 117 (*)    Albumin 3.4 (*)    ALT 10 (*)     All other components within normal limits  CBC - Abnormal; Notable for the following:    Hemoglobin 9.3 (*)    HCT 30.7 (*)    MCV 70.6 (*)    MCH 21.4 (*)    RDW 16.0 (*)    Platelets 427 (*)    All other components within normal limits  URINALYSIS, ROUTINE W REFLEX MICROSCOPIC (NOT AT [facility]) - Abnormal; Notable for the following:    APPearance TURBID (*)    All other components within normal limits  LIPASE, BLOOD  D-DIMER, QUANTITATIVE (NOT AT [facility])  URINE MICROSCOPIC-ADD ON  I-STAT BETA HCG BLOOD, ED ([facility], [facility], [facility] ONLY)    Imaging Review Dg Chest Portable 1 View  12/23/2014  CLINICAL DATA:  19 year old female with acute left chest pain for 2 weeks. EXAM: PORTABLE CHEST 1 VIEW COMPARISON:  None. FINDINGS: The cardiomediastinal silhouette is unremarkable. There is no evidence of focal airspace disease, pulmonary edema, suspicious pulmonary nodule/mass, pleural effusion, or pneumothorax. No acute bony abnormalities are identified. IMPRESSION: No active disease. Electronically Signed   By: Margarette Canada M.D.   On: 12/23/2014 19:04    Laboratory and Imaging results were personally reviewed by myself and used in the medical decision making of this patient's treatment and disposition.  EKG Interpretation  EKG Interpretation  Date/Time:    Ventricular Rate:    PR Interval:    QRS Duration:   QT Interval:    QTC Calculation:   R Axis:     Text Interpretation:        MDM  Kimberly Gibson is a 19 y.o. female with H&P as above. ED clinical course as follows:  Patient is mechanically stable afebrile and nontoxic on physical examination. Patient's abdominal exam benign reveals no concerns for peritonitis or involuntary rebound. Patient endorses no back pain on physical exam therefore do not suspect pyelonephritis as etiology. The patient has no concerning CP unremarkable and there are no LFT had maladies effort do not suspect gallbladder or liver or common bile duct or pancreatic pathology at  this time as lipase is within normal limits. Patient has hemoglobin at baseline and no leukocytosis on CBC. Given patient had tachycardia I obtained d-dimer due to concerns for pulmonary embolism however this is negative and within normal limits therefore do not suspect as etiology. EKG concerning for cardiac ischemia and shows changes consistent with early repolarization. Patient has urine pregnancy negative. chest x-ray shows no acute cardio pulmonary artery maladies and patient is well-appearing upon repeated examinations. Return for cautious given in great detail and extensive's time 7 patient who states understanding that she is to follow-up and establish primary care and that should any concerning symptoms and we discussed return she is to report to emergency department for reevaluation.  Clinical Impression:  1. Generalized abdominal pain   2. Nonintractable headache, unspecified chronicity pattern, unspecified  headache type   3. Chest pain, unspecified chest pain type    Disposition:  D/c to home  Patient care discussed with Dr. Rex Kras, who oversaw their evaluation & treatment & voiced agreement. House Officer: Voncille Lo, MD, Emergency Medicine.  Voncille Lo, MD 12/23/14 [id number]  Voncille Lo, MD 12/23/14 2028  Sharlett Iles, MD 12/24/14 [phone]

## 2015-02-21 ENCOUNTER — Emergency Department (HOSPITAL_COMMUNITY)
Admission: EM | Admit: 2015-02-21 | Discharge: 2015-02-21 | Disposition: A | Discharge status: Home / Self Care | Payer: Medicaid Other | Attending: Emergency Medicine | Admitting: Emergency Medicine

## 2015-02-21 ENCOUNTER — Encounter (HOSPITAL_COMMUNITY): Payer: Self-pay | Admitting: Vascular Surgery

## 2015-02-21 DIAGNOSIS — R42 Dizziness and giddiness: Secondary | ICD-10-CM | POA: Diagnosis not present

## 2015-02-21 DIAGNOSIS — R1010 Upper abdominal pain, unspecified: Secondary | ICD-10-CM | POA: Insufficient documentation

## 2015-02-21 DIAGNOSIS — Z8719 Personal history of other diseases of the digestive system: Secondary | ICD-10-CM | POA: Diagnosis not present

## 2015-02-21 DIAGNOSIS — Z87891 Personal history of nicotine dependence: Secondary | ICD-10-CM | POA: Diagnosis not present

## 2015-02-21 DIAGNOSIS — Z8742 Personal history of other diseases of the female genital tract: Secondary | ICD-10-CM | POA: Diagnosis not present

## 2015-02-21 DIAGNOSIS — R197 Diarrhea, unspecified: Secondary | ICD-10-CM | POA: Diagnosis not present

## 2015-02-21 DIAGNOSIS — R112 Nausea with vomiting, unspecified: Secondary | ICD-10-CM | POA: Insufficient documentation

## 2015-02-21 DIAGNOSIS — Z3202 Encounter for pregnancy test, result negative: Secondary | ICD-10-CM | POA: Diagnosis not present

## 2015-02-21 LAB — CBC
HEMATOCRIT: 30.5 % — AB (ref 36.0–46.0)
Hemoglobin: 9 g/dL — ABNORMAL LOW (ref 12.0–15.0)
MCH: 20.6 pg — ABNORMAL LOW (ref 26.0–34.0)
MCHC: 29.5 g/dL — AB (ref 30.0–36.0)
MCV: 69.8 fL — AB (ref 78.0–100.0)
Platelets: 361 10*3/uL (ref 150–400)
RBC: 4.37 MIL/uL (ref 3.87–5.11)
RDW: 17 % — ABNORMAL HIGH (ref 11.5–15.5)
WBC: 7.1 10*3/uL (ref 4.0–10.5)

## 2015-02-21 LAB — URINALYSIS, ROUTINE W REFLEX MICROSCOPIC
GLUCOSE, UA: NEGATIVE mg/dL
Hgb urine dipstick: NEGATIVE
Ketones, ur: NEGATIVE mg/dL
LEUKOCYTES UA: NEGATIVE
Nitrite: NEGATIVE
PH: 7 (ref 5.0–8.0)
Protein, ur: NEGATIVE mg/dL
Specific Gravity, Urine: 1.033 — ABNORMAL HIGH (ref 1.005–1.030)

## 2015-02-21 LAB — COMPREHENSIVE METABOLIC PANEL
ALBUMIN: 3.6 g/dL (ref 3.5–5.0)
ALT: 10 U/L — ABNORMAL LOW (ref 14–54)
ANION GAP: 7 (ref 5–15)
AST: 18 U/L (ref 15–41)
Alkaline Phosphatase: 70 U/L (ref 38–126)
BILIRUBIN TOTAL: 0.7 mg/dL (ref 0.3–1.2)
BUN: 9 mg/dL (ref 6–20)
CHLORIDE: 108 mmol/L (ref 101–111)
CO2: 23 mmol/L (ref 22–32)
Calcium: 9 mg/dL (ref 8.9–10.3)
Creatinine, Ser: 0.55 mg/dL (ref 0.44–1.00)
GFR calc Af Amer: >60 mL/min (ref >60)
GFR calc non Af Amer: >60 mL/min (ref >60)
GLUCOSE: 91 mg/dL (ref 65–99)
POTASSIUM: 3.5 mmol/L (ref 3.5–5.1)
SODIUM: 138 mmol/L (ref 135–145)
Total Protein: 7.5 g/dL (ref 6.5–8.1)

## 2015-02-21 LAB — POC URINE PREG, ED: Preg Test, Ur: NEGATIVE

## 2015-02-21 MED ORDER — SODIUM CHLORIDE 0.9 % IV BOLUS (SEPSIS)
1000.0000 mL | Freq: Once | INTRAVENOUS | Status: AC
  Administered 2015-02-21: 1000 mL via INTRAVENOUS

## 2015-02-21 MED ORDER — ONDANSETRON 4 MG PO TBDP: 4.0000 mg | ORAL_TABLET | Freq: Three times a day (TID) | ORAL | Status: DC | PRN

## 2015-02-21 MED ORDER — ONDANSETRON 4 MG PO TBDP
4.0000 mg | ORAL_TABLET | Freq: Once | ORAL | Status: AC | PRN
  Administered 2015-02-21: 4 mg via ORAL

## 2015-02-21 MED ORDER — ONDANSETRON 4 MG PO TBDP
ORAL_TABLET | ORAL | Status: AC
  Filled 2015-02-21: qty 1

## 2015-02-21 MED ORDER — ONDANSETRON HCL 4 MG/2ML IJ SOLN
4.0000 mg | Freq: Once | INTRAMUSCULAR | Status: AC
  Administered 2015-02-21: 4 mg via INTRAVENOUS
  Filled 2015-02-21: qty 2

## 2015-02-21 MED ORDER — DICYCLOMINE HCL 10 MG PO CAPS
10.0000 mg | ORAL_CAPSULE | Freq: Once | ORAL | Status: AC
  Administered 2015-02-21: 10 mg via ORAL
  Filled 2015-02-21: qty 1

## 2015-02-21 NOTE — ED Notes (Addendum)
Pt reports to the ED for eval of N/V that began last pm. She also reports hot and cold chills. Pt also reports diarrhea x 1 episode yesterday. Pt denies any hematemesis or or blood in her stool. Pt also reports some abd pain x 1 week. Pt A&Ox4, resp e/u, and skin warm and dry.

## 2015-02-21 NOTE — Discharge Instructions (Signed)

## 2015-02-21 NOTE — ED Provider Notes (Signed)
CSN: [id number]     Arrival date & time 02/21/15  1828 History   First MD Initiated Contact with Patient 02/21/15 2156     Chief Complaint  Patient presents with  . Emesis   Kimberly Gibson is a 19 y.o. female who is otherwise healthy who presents to the emergency department complaining of nausea, vomiting and diarrhea since last night. Patient reports 4 episodes of vomiting today and one episode of diarrhea last night. Currently she reports her nausea is better after Zofran. She still reports some slight nausea. She also reports positional lightheadedness currently. She reports her abdominal pain is completely resolved. She reports she was having upper abdominal discomfort earlier today but denies current abdominal pain. She denies previous abdominal surgeries. The patient denies fevers, chills, hematemesis, hematochezia, urinary symptoms, hematuria, syncope, cough, wheezing, shortness of breath or sick contacts.  (Consider location/radiation/quality/duration/timing/severity/associated sxs/prior Treatment) HPI  Past Medical History  Diagnosis Date  . GERD (gastroesophageal reflux disease)   . BV (bacterial vaginosis)   . Normal pregnancy 09/17/2013   Past Surgical History  Procedure Laterality Date  . No past surgeries    . Wisdom tooth extraction     Family History  Problem Relation Age of Onset  . Asthma Sister   . Hearing loss Neg Hx    Social History  Substance Use Topics  . Smoking status: Former Research scientist (life sciences)  . Smokeless tobacco: Never Used     Comment: quit 2011  . Alcohol Use: No   OB History    Gravida Para Term Preterm AB TAB SAB Ectopic Multiple Living   1 1 1  0 0 0 0 0 0 1     Review of Systems  Constitutional: Negative for fever and chills.  HENT: Negative for congestion and sore throat.   Eyes: Negative for visual disturbance.  Respiratory: Negative for cough, shortness of breath and wheezing.   Cardiovascular: Negative for chest pain and palpitations.   Gastrointestinal: Positive for nausea, vomiting, abdominal pain and diarrhea. Negative for blood in stool.  Genitourinary: Negative for dysuria, urgency, frequency, hematuria, decreased urine volume, vaginal bleeding, vaginal discharge and difficulty urinating.  Musculoskeletal: Negative for back pain and neck pain.  Skin: Negative for rash.  Neurological: Positive for light-headedness. Negative for headaches.      Allergies  Review of patient's allergies indicates no known allergies.  Home Medications   Prior to Admission medications   Medication Sig Start Date End Date Taking? Authorizing Provider  ferrous sulfate 325 (65 FE) MG tablet Take 1 tablet (325 mg total) by mouth 2 (two) times daily with a meal. Patient not taking: Reported on 06/22/2014 09/20/13   Newton Pigg, MD  ibuprofen (ADVIL,MOTRIN) 600 MG tablet Take 1 tablet (600 mg total) by mouth every 6 (six) hours as needed. Patient not taking: Reported on 06/22/2014 09/20/13   Newton Pigg, MD  ondansetron (ZOFRAN ODT) 4 MG disintegrating tablet Take 1 tablet (4 mg total) by mouth every 8 (eight) hours as needed for nausea or vomiting. 02/21/15   Waynetta Pean, PA-C  oxyCODONE-acetaminophen (PERCOCET/ROXICET) 5-325 MG per tablet Take 1 tablet by mouth every 6 (six) hours as needed for severe pain (moderate - severe pain). Patient not taking: Reported on 06/22/2014 09/20/13   Newton Pigg, MD   BP 121/66 mmHg  Pulse 95  Temp(Src) 98.7 F (37.1 C) (Oral)  Resp 16  SpO2 100% Physical Exam  Constitutional: She appears well-developed and well-nourished. No distress.  Nontoxic appearing.  HENT:  Head: Normocephalic  and atraumatic.  Mouth/Throat: Oropharynx is clear and moist.  Eyes: Conjunctivae are normal. Pupils are equal, round, and reactive to light. Right eye exhibits no discharge. Left eye exhibits no discharge.  Neck: Neck supple.  Cardiovascular: Normal rate, regular rhythm, normal heart sounds and intact distal  pulses.  Exam reveals no gallop and no friction rub.   No murmur heard. Pulmonary/Chest: Effort normal and breath sounds normal. No respiratory distress. She has no wheezes. She has no rales.  Abdominal: Soft. Bowel sounds are normal. She exhibits no distension. There is no tenderness. There is no rebound and no guarding.  Abdomen is soft and nontender to palpation. Bowel sounds are present. No peritoneal signs. No right lower quadrant tenderness to palpation. No psoas or obturator sign.  Musculoskeletal: She exhibits no edema.  Lymphadenopathy:    She has no cervical adenopathy.  Neurological: She is alert. Coordination normal.  Skin: Skin is warm and dry. No rash noted. She is not diaphoretic. No erythema. No pallor.  Psychiatric: She has a normal mood and affect. Her behavior is normal.  Nursing note and vitals reviewed.   ED Course  Procedures (including critical care time) Labs Review Labs Reviewed  COMPREHENSIVE METABOLIC PANEL - Abnormal; Notable for the following:    ALT 10 (*)    All other components within normal limits  CBC - Abnormal; Notable for the following:    Hemoglobin 9.0 (*)    HCT 30.5 (*)    MCV 69.8 (*)    MCH 20.6 (*)    MCHC 29.5 (*)    RDW 17.0 (*)    All other components within normal limits  URINALYSIS, ROUTINE W REFLEX MICROSCOPIC (NOT AT [facility]) - Abnormal; Notable for the following:    Color, Urine AMBER (*)    APPearance CLOUDY (*)    Specific Gravity, Urine 1.033 (*)    Bilirubin Urine SMALL (*)    All other components within normal limits  POC URINE PREG, ED    Imaging Review No results found. I have personally reviewed and evaluated these lab results as part of my medical decision-making.   EKG Interpretation None      Filed Vitals:   02/21/15 2300 02/21/15 2315 02/21/15 2331 02/21/15 2337  BP: 122/104 118/81 121/66   Pulse: 89 92 95   Temp:    98.7 F (37.1 C)  TempSrc:    Oral  Resp:      SpO2: 100% 100% 100%      MDM    Meds given in ED:  Medications  ondansetron (ZOFRAN-ODT) disintegrating tablet 4 mg (4 mg Oral Given 02/21/15 1847)  sodium chloride 0.9 % bolus 1,000 mL (0 mLs Intravenous Stopped 02/21/15 2337)  ondansetron (ZOFRAN) injection 4 mg (4 mg Intravenous Given 02/21/15 2230)  dicyclomine (BENTYL) capsule 10 mg (10 mg Oral Given 02/21/15 2335)    Discharge Medication List as of 02/21/2015 11:26 PM    START taking these medications   Details  ondansetron (ZOFRAN ODT) 4 MG disintegrating tablet Take 1 tablet (4 mg total) by mouth every 8 (eight) hours as needed for nausea or vomiting., Starting 02/21/2015, Until Discontinued, Print        Final diagnoses:  Nausea vomiting and diarrhea   This is a 19 y.o. female who is otherwise healthy who presents to the emergency department complaining of nausea, vomiting and diarrhea since last night. Patient reports 4 episodes of vomiting today and one episode of diarrhea last night. Currently she reports her  nausea is better after Zofran. She still reports some slight nausea. She also reports positional lightheadedness currently. She reports her abdominal pain is completely resolved.  On exam the patient is afebrile nontoxic appearing. Her abdomen is soft and nontender to palpation. She is a negative urine pregnancy test. Urinalysis is nitrite and leukocyte negative. CMP is unremarkable. CBC is stable at her baseline with anemia with hgb of 9. Will provide with fluid bolus, Zofran and Bentyl and reevaluate. Reevaluation patient reports she is feeling better. She is no longer feeling lightheaded. She is tolerated by mouth liquids without nausea or vomiting. We'll discharge with protrusion of her Zofran and have her follow up with primary care. I discussed strict return precautions. I advised the patient to follow-up with their primary care provider this week. I advised the patient to return to the emergency department with new or worsening symptoms or new  concerns. The patient verbalized understanding and agreement with plan.       Waynetta Pean, PA-C 02/22/15 0120  Wandra Arthurs, MD 02/23/15 [phone]

## 2015-03-24 ENCOUNTER — Encounter (HOSPITAL_COMMUNITY): Payer: Self-pay

## 2015-03-24 ENCOUNTER — Emergency Department (HOSPITAL_COMMUNITY)
Admission: EM | Admit: 2015-03-24 | Discharge: 2015-03-24 | Disposition: A | Discharge status: Home / Self Care | Payer: Medicaid Other | Attending: Emergency Medicine | Admitting: Emergency Medicine

## 2015-03-24 DIAGNOSIS — Z8742 Personal history of other diseases of the female genital tract: Secondary | ICD-10-CM | POA: Insufficient documentation

## 2015-03-24 DIAGNOSIS — R229 Localized swelling, mass and lump, unspecified: Secondary | ICD-10-CM

## 2015-03-24 DIAGNOSIS — Z87891 Personal history of nicotine dependence: Secondary | ICD-10-CM | POA: Diagnosis not present

## 2015-03-24 DIAGNOSIS — R222 Localized swelling, mass and lump, trunk: Secondary | ICD-10-CM | POA: Diagnosis present

## 2015-03-24 DIAGNOSIS — Z8719 Personal history of other diseases of the digestive system: Secondary | ICD-10-CM | POA: Diagnosis not present

## 2015-03-24 NOTE — ED Notes (Signed)
Pt. Presents with complaint potential abscess to L groin area. Pt. States area presented 3 weeks ago and she attempted to drain area with no improvement.

## 2015-03-24 NOTE — Discharge Instructions (Signed)
Please read attached information. If you experience any new or worsening signs or symptoms please return to the emergency room for evaluation. Please follow-up with your primary care provider or specialist as discussed. Please use medication prescribed only as directed and discontinue taking if you have any concerning signs or symptoms.   °

## 2015-03-24 NOTE — ED Provider Notes (Signed)
CSN: [id number]     Arrival date & time 03/24/15  1036 History  By signing my name below, I, Hilda Lias, attest that this documentation has been prepared under the direction and in the presence of [employer], PA-C.  Electronically Signed: Hilda Lias, ED Scribe. 03/24/2015. 12:22 PM.    Chief Complaint  Patient presents with  . Abscess      Patient is a 20 y.o. female presenting with abscess. The history is provided by the patient. No language interpreter was used.  Abscess   HPI Comments: Kimberly Gibson is a 20 y.o. female who presents to the Emergency Department complaining of a constant, worsening abscess to her left groin area that has been present for 3 weeks. Pt states she attempted to drain the area a few times with no relief. Pt states she shaved the area 3 weeks ago, and reports the area has grown in size since then. Pt denies any other complains and denies a hx of abscesses in the past.   Past Medical History  Diagnosis Date  . GERD (gastroesophageal reflux disease)   . BV (bacterial vaginosis)   . Normal pregnancy 09/17/2013   Past Surgical History  Procedure Laterality Date  . No past surgeries    . Wisdom tooth extraction     Family History  Problem Relation Age of Onset  . Asthma Sister   . Hearing loss Neg Hx    Social History  Substance Use Topics  . Smoking status: Former Research scientist (life sciences)  . Smokeless tobacco: Never Used     Comment: quit 2011  . Alcohol Use: No   OB History    Gravida Para Term Preterm AB TAB SAB Ectopic Multiple Living   1 1 1  0 0 0 0 0 0 1     Review of Systems  A complete 10 system review of systems was obtained and all systems are negative except as noted in the HPI and PMH.    Allergies  Review of patient's allergies indicates no known allergies.  Home Medications   Prior to Admission medications   Medication Sig Start Date End Date Taking? Authorizing Provider  ferrous sulfate 325 (65 FE) MG tablet Take 1 tablet (325 mg total)  by mouth 2 (two) times daily with a meal. Patient not taking: Reported on 06/22/2014 09/20/13   Newton Pigg, MD  ibuprofen (ADVIL,MOTRIN) 600 MG tablet Take 1 tablet (600 mg total) by mouth every 6 (six) hours as needed. Patient not taking: Reported on 06/22/2014 09/20/13   Newton Pigg, MD  ondansetron (ZOFRAN ODT) 4 MG disintegrating tablet Take 1 tablet (4 mg total) by mouth every 8 (eight) hours as needed for nausea or vomiting. 02/21/15   Waynetta Pean, PA-C  oxyCODONE-acetaminophen (PERCOCET/ROXICET) 5-325 MG per tablet Take 1 tablet by mouth every 6 (six) hours as needed for severe pain (moderate - severe pain). Patient not taking: Reported on 06/22/2014 09/20/13   Newton Pigg, MD   BP 116/85 mmHg  Pulse 92  Temp(Src) 98.8 F (37.1 C)  Resp 16  SpO2 100% Physical Exam  Constitutional: She is oriented to person, place, and time. She appears well-developed and well-nourished.  HENT:  Head: Normocephalic and atraumatic.  Cardiovascular: Normal rate.   Pulmonary/Chest: Effort normal.  Abdominal: She exhibits no distension.  Neurological: She is alert and oriented to person, place, and time.  Skin: Skin is warm and dry.  Small nodule ( .25cm) to the area inferior lateral to the labia major, no redness,  swelling, fluctuance.  Psychiatric: She has a normal mood and affect.  Nursing note and vitals reviewed.   ED Course  Procedures (including critical care time)  DIAGNOSTIC STUDIES: Oxygen Saturation is 100% on room air, normal by my interpretation.    COORDINATION OF CARE: 11:57 AM Discussed treatment plan with pt at bedside and pt agreed to plan.    MDM   Final diagnoses:  Skin nodule  Labs:  Imaging:  Consults:  Therapeutics:  Discharge Meds:   Assessment/Plan: 20 year old female presents today with the nodule to the skin surrounding her vagina. This appears to be an ingrown hair that she has attempted to relieve numerous times with resulting scar tissue. It is  very minimal at 0.25 cm, with no signs of infection. Patient will be instructed to use warm compresses, monitor for worsening signs or symptoms. Patient is instructed follow-up with her primary care.      I personally performed the services described in this documentation, which was scribed in my presence. The recorded information has been reviewed and is accurate.    Okey Regal, PA-C 03/24/15 1234  Davonna Belling, MD 03/24/15 [phone]

## 2015-06-06 ENCOUNTER — Inpatient Hospital Stay (HOSPITAL_COMMUNITY): Payer: Medicaid Other

## 2015-06-06 ENCOUNTER — Inpatient Hospital Stay (HOSPITAL_COMMUNITY)
Admission: AD | Admit: 2015-06-06 | Discharge: 2015-06-06 | Disposition: A | Discharge status: Home / Self Care | Payer: Medicaid Other | Source: Ambulatory Visit | Attending: Obstetrics and Gynecology | Admitting: Obstetrics and Gynecology

## 2015-06-06 ENCOUNTER — Encounter (HOSPITAL_COMMUNITY): Payer: Self-pay | Admitting: *Deleted

## 2015-06-06 DIAGNOSIS — K219 Gastro-esophageal reflux disease without esophagitis: Secondary | ICD-10-CM | POA: Insufficient documentation

## 2015-06-06 DIAGNOSIS — Z87891 Personal history of nicotine dependence: Secondary | ICD-10-CM | POA: Diagnosis not present

## 2015-06-06 DIAGNOSIS — Z975 Presence of (intrauterine) contraceptive device: Secondary | ICD-10-CM | POA: Diagnosis not present

## 2015-06-06 DIAGNOSIS — N921 Excessive and frequent menstruation with irregular cycle: Secondary | ICD-10-CM

## 2015-06-06 DIAGNOSIS — R109 Unspecified abdominal pain: Secondary | ICD-10-CM | POA: Diagnosis present

## 2015-06-06 DIAGNOSIS — R110 Nausea: Secondary | ICD-10-CM | POA: Diagnosis present

## 2015-06-06 DIAGNOSIS — N939 Abnormal uterine and vaginal bleeding, unspecified: Secondary | ICD-10-CM | POA: Diagnosis not present

## 2015-06-06 LAB — CBC
HCT: 24.9 % — ABNORMAL LOW (ref 36.0–46.0)
Hemoglobin: 7.2 g/dL — ABNORMAL LOW (ref 12.0–15.0)
MCH: 19.3 pg — AB (ref 26.0–34.0)
MCHC: 28.9 g/dL — AB (ref 30.0–36.0)
MCV: 66.8 fL — AB (ref 78.0–100.0)
Platelets: 429 10*3/uL — ABNORMAL HIGH (ref 150–400)
RBC: 3.73 MIL/uL — ABNORMAL LOW (ref 3.87–5.11)
RDW: 17 % — AB (ref 11.5–15.5)
WBC: 7.6 10*3/uL (ref 4.0–10.5)

## 2015-06-06 LAB — URINALYSIS, ROUTINE W REFLEX MICROSCOPIC
Bilirubin Urine: NEGATIVE
GLUCOSE, UA: NEGATIVE mg/dL
HGB URINE DIPSTICK: NEGATIVE
KETONES UR: NEGATIVE mg/dL
Leukocytes, UA: NEGATIVE
Nitrite: NEGATIVE
Protein, ur: NEGATIVE mg/dL
Specific Gravity, Urine: 1.015 (ref 1.005–1.030)
pH: 7.5 (ref 5.0–8.0)

## 2015-06-06 LAB — POCT PREGNANCY, URINE: PREG TEST UR: NEGATIVE

## 2015-06-06 MED ORDER — FERROUS SULFATE 325 (65 FE) MG PO TABS: 325.0000 mg | ORAL_TABLET | Freq: Two times a day (BID) | ORAL | Status: DC

## 2015-06-06 MED ORDER — IBUPROFEN 600 MG PO TABS
600.0000 mg | ORAL_TABLET | Freq: Once | ORAL | Status: AC
  Administered 2015-06-06: 600 mg via ORAL
  Filled 2015-06-06: qty 1

## 2015-06-06 MED ORDER — IBUPROFEN 600 MG PO TABS: 600.0000 mg | ORAL_TABLET | Freq: Three times a day (TID) | ORAL | Status: DC | PRN

## 2015-06-06 NOTE — MAU Note (Signed)
Patient presents to mau with c/o vomiting that started today; has been able to tolerated food; vomit 3 times today. Endorse abodominal cramping and vaginal bleeding that started 2 days ago; states has not had a period since the nexplanon placement in 2015.

## 2015-06-06 NOTE — MAU Provider Note (Signed)
History   [id number]   Chief Complaint  Patient presents with  . Vaginal Bleeding  . Abdominal Cramping  . Nausea    HPI Kimberly Gibson is a 20 y.o. female  G1P1001 here with report of vomiting that started today.  States has vomited three times in past 24 hours.  Denies fever, body aches, or chills.  No known exposure to ill individuals.  Able to tolerate food.   Also reports lower pelvic pain that started.  No report of abnormal vaginal discharge.  Vaginal bleeding that started 2 days ago.  Bleeding is described as heavy.  Reports needing to change pad every hour.  Denies being lightheaded or dizzy.  +Nexplanon in place.  This is first episode of bleeding since having it placed in 2015.    No LMP recorded. Patient has had an implant.  OB History  Gravida Para Term Preterm AB SAB TAB Ectopic Multiple Living  1 1 1  0 0 0 0 0 0 1    # Outcome Date GA Lbr Len/2nd Weight Sex Delivery Anes PTL Lv  1 Term 09/18/13 [redacted]w[redacted]d 04:15 / 00:26 3.147 kg (6 lb 15 oz) M Vag-Spont EPI  Y     Comments: none      Past Medical History  Diagnosis Date  . GERD (gastroesophageal reflux disease)   . BV (bacterial vaginosis)   . Normal pregnancy 09/17/2013    Family History  Problem Relation Age of Onset  . Asthma Sister   . Hearing loss Neg Hx     Social History   Social History  . Marital Status: Single    Spouse Name: N/A  . Number of Children: N/A  . Years of Education: N/A   Social History Main Topics  . Smoking status: Former Research scientist (life sciences)  . Smokeless tobacco: Never Used     Comment: quit 2011  . Alcohol Use: No  . Drug Use: No  . Sexual Activity: Yes    Birth Control/ Protection: Implant   Other Topics Concern  . None   Social History Narrative    No Known Allergies  No current facility-administered medications on file prior to encounter.   Current Outpatient Prescriptions on File Prior to Encounter  Medication Sig Dispense Refill  . ondansetron (ZOFRAN ODT) 4 MG disintegrating  tablet Take 1 tablet (4 mg total) by mouth every 8 (eight) hours as needed for nausea or vomiting. (Patient not taking: Reported on 06/06/2015) 10 tablet 0     Review of Systems  Constitutional: Negative for fever and chills.  Genitourinary: Positive for frequency, vaginal bleeding and pelvic pain. Negative for dysuria.  Neurological: Negative for dizziness, syncope and weakness.  All other systems reviewed and are negative.    Physical Exam   Filed Vitals:   06/06/15 2010  BP: 114/62  Pulse: 85  Temp: 98.1 F (36.7 C)  TempSrc: Oral  Resp: 16  Height: 5\' 4"  (1.626 m)  Weight: 65.227 kg (143 lb 12.8 oz)  SpO2: 100%    Physical Exam  Constitutional: She is oriented to person, place, and time. She appears well-developed and well-nourished.  HENT:  Head: Normocephalic.  Neck: Normal range of motion. Neck supple.  Cardiovascular: Normal rate, regular rhythm and normal heart sounds.   Respiratory: Effort normal and breath sounds normal.  GI: Soft. She exhibits no mass. There is no guarding.  Genitourinary: Uterus is tender (mild midpelvic tenderness with palpation). Uterus is not enlarged. There is bleeding (negative clots; moderate bleeding) in the  vagina.  Musculoskeletal:       Arms: Neurological: She is alert and oriented to person, place, and time. She has normal reflexes.  Skin: Skin is warm and dry.    MAU Course  Procedures  MDM Results for orders placed or performed during the hospital encounter of 06/06/15 (from the past 24 hour(s))  Urinalysis, Routine w reflex microscopic (not at [facility])     Status: None   Collection Time: 06/06/15  8:10 PM  Result Value Ref Range   Color, Urine YELLOW YELLOW   APPearance CLEAR CLEAR   Specific Gravity, Urine 1.015 1.005 - 1.030   pH 7.5 5.0 - 8.0   Glucose, UA NEGATIVE NEGATIVE mg/dL   Hgb urine dipstick NEGATIVE NEGATIVE   Bilirubin Urine NEGATIVE NEGATIVE   Ketones, ur NEGATIVE NEGATIVE mg/dL   Protein, ur NEGATIVE  NEGATIVE mg/dL   Nitrite NEGATIVE NEGATIVE   Leukocytes, UA NEGATIVE NEGATIVE  Pregnancy, urine POC     Status: None   Collection Time: 06/06/15  8:21 PM  Result Value Ref Range   Preg Test, Ur NEGATIVE NEGATIVE  CBC     Status: Abnormal   Collection Time: 06/06/15  8:55 PM  Result Value Ref Range   WBC 7.6 4.0 - 10.5 K/uL   RBC 3.73 (L) 3.87 - 5.11 MIL/uL   Hemoglobin 7.2 (L) 12.0 - 15.0 g/dL   HCT 24.9 (L) 36.0 - 46.0 %   MCV 66.8 (L) 78.0 - 100.0 fL   MCH 19.3 (L) 26.0 - 34.0 pg   MCHC 28.9 (L) 30.0 - 36.0 g/dL   RDW 17.0 (H) 11.5 - 15.5 %   Platelets 429 (H) 150 - 400 K/uL   2140 Consulted with Dr. Terri Piedra > Reviewed HPI/Exam/labs/inablility to palpate Nexplanon > obtain xray of arm  FINDINGS: The patient's Nexplanon implant is noted along the medial aspect of the distal upper left arm. There is no evidence of osseous disruption. The left elbow joint is grossly unremarkable in appearance.  IMPRESSION: Nexplanon implant noted along the medial aspect of the distal upper left arm.   Assessment and Plan  Breakthrough Bleeding - Nexplanon Anemia  Plan: Discharge home RX Ferrous Sulfate 325 mg BID Follow-up in office if bleeding worsens or does not improve   Gwen Pounds, CNM 06/06/2015 10:34 PM

## 2015-07-13 ENCOUNTER — Emergency Department (HOSPITAL_COMMUNITY)
Admission: EM | Admit: 2015-07-13 | Discharge: 2015-07-13 | Disposition: A | Discharge status: Home / Self Care | Payer: Medicaid Other | Attending: Emergency Medicine | Admitting: Emergency Medicine

## 2015-07-13 ENCOUNTER — Encounter (HOSPITAL_COMMUNITY): Payer: Self-pay

## 2015-07-13 DIAGNOSIS — K219 Gastro-esophageal reflux disease without esophagitis: Secondary | ICD-10-CM | POA: Insufficient documentation

## 2015-07-13 DIAGNOSIS — Z87891 Personal history of nicotine dependence: Secondary | ICD-10-CM | POA: Insufficient documentation

## 2015-07-13 DIAGNOSIS — J029 Acute pharyngitis, unspecified: Secondary | ICD-10-CM

## 2015-07-13 LAB — RAPID STREP SCREEN (MED CTR MEBANE ONLY): Streptococcus, Group A Screen (Direct): NEGATIVE

## 2015-07-13 MED ORDER — DEXAMETHASONE 10 MG/ML FOR PEDIATRIC ORAL USE
16.0000 mg | Freq: Once | INTRAMUSCULAR | Status: AC
  Administered 2015-07-13: 16 mg via ORAL
  Filled 2015-07-13: qty 2

## 2015-07-13 MED ORDER — MAGIC MOUTHWASH W/LIDOCAINE: 5.0000 mL | Freq: Three times a day (TID) | ORAL | Status: DC | PRN

## 2015-07-13 MED ORDER — DEXAMETHASONE 1 MG/ML PO CONC: 16.0000 mg | Freq: Once | ORAL | Status: DC

## 2015-07-13 MED ORDER — LIDOCAINE VISCOUS 2 % MT SOLN
15.0000 mL | Freq: Once | OROMUCOSAL | Status: AC
  Administered 2015-07-13: 15 mL via OROMUCOSAL
  Filled 2015-07-13: qty 15

## 2015-07-13 NOTE — ED Provider Notes (Signed)
CSN: [id number]     Arrival date & time 07/13/15  0756 History   First MD Initiated Contact with Patient 07/13/15 [phone]     Chief Complaint  Patient presents with  . Sore Throat     (Consider location/radiation/quality/duration/timing/severity/associated sxs/prior Treatment) Patient is a 20 y.o. female presenting with pharyngitis.  Sore Throat This is a new problem. The current episode started more than 2 days ago. The problem occurs constantly. Pertinent negatives include no chest pain, no abdominal pain, no headaches and no shortness of breath. Nothing aggravates the symptoms. Nothing relieves the symptoms. She has tried nothing for the symptoms.    Past Medical History  Diagnosis Date  . GERD (gastroesophageal reflux disease)   . BV (bacterial vaginosis)   . Normal pregnancy 09/17/2013   Past Surgical History  Procedure Laterality Date  . No past surgeries    . Wisdom tooth extraction     Family History  Problem Relation Age of Onset  . Asthma Sister   . Hearing loss Neg Hx    Social History  Substance Use Topics  . Smoking status: Former Research scientist (life sciences)  . Smokeless tobacco: Never Used     Comment: quit 2011  . Alcohol Use: No   OB History    Gravida Para Term Preterm AB TAB SAB Ectopic Multiple Living   1 1 1  0 0 0 0 0 0 1     Review of Systems  Constitutional: Negative for fever and chills.  HENT: Positive for congestion, rhinorrhea and sore throat. Negative for mouth sores.   Respiratory: Positive for cough. Negative for chest tightness and shortness of breath.   Cardiovascular: Negative for chest pain.  Gastrointestinal: Negative for nausea, vomiting and abdominal pain.  Neurological: Negative for headaches.  All other systems reviewed and are negative.     Allergies  Review of patient's allergies indicates no known allergies.  Home Medications   Prior to Admission medications   Medication Sig Start Date End Date Taking? Authorizing Provider  etonogestrel  (NEXPLANON) 68 MG IMPL implant 1 each by Subdermal route once. Placed 08/2013   Yes Historical Provider, MD  Phenyleph-Doxylamine-DM-APAP (NYQUIL SEVERE COLD/FLU) 5-6.25-10-325 MG/15ML LIQD Take 10 mLs by mouth once.   Yes Historical Provider, MD  magic mouthwash w/lidocaine SOLN Take 5 mLs by mouth 3 (three) times daily as needed for mouth pain. 07/13/15   Merrily Pew, MD   BP 119/75 mmHg  Pulse 75  Temp(Src) 98.5 F (36.9 C) (Oral)  Resp 14  SpO2 95% Physical Exam  Constitutional: She appears well-developed and well-nourished.  HENT:  Head: Normocephalic and atraumatic.  Mouth/Throat: Mucous membranes are not pale and not dry. Posterior oropharyngeal erythema present. No oropharyngeal exudate or tonsillar abscesses.  Neck: Normal range of motion.  Cardiovascular: Normal rate and regular rhythm.   Pulmonary/Chest: Effort normal and breath sounds normal. No stridor. No respiratory distress. She has no wheezes. She has no rales.  Abdominal: She exhibits no distension.  Musculoskeletal: She exhibits no edema or tenderness.  Lymphadenopathy:    She has no cervical adenopathy.  Neurological: She is alert.  Nursing note and vitals reviewed.   ED Course  Procedures (including critical care time) Labs Review Labs Reviewed  RAPID STREP SCREEN (NOT AT [facility])  CULTURE, GROUP A STREP [facility])    Imaging Review No results found. I have personally reviewed and evaluated these images and lab results as part of my medical decision-making.   EKG Interpretation None  MDM   Final diagnoses:  Pharyngitis    Pharyngitis, likely viral. No asymmetry to suggest PTA. No fevers, difficulty swallowing to suggest RPA. Centor of 1, no need to rapid strep.  Will treat symptomatically with decadron and magic mouthwash.   New Prescriptions: New Prescriptions   MAGIC MOUTHWASH W/LIDOCAINE SOLN    Take 5 mLs by mouth 3 (three) times daily as needed for mouth pain.     I have personally and  contemperaneously reviewed labs and imaging and used in my decision making as above.   A medical screening exam was performed and I feel the patient has had an appropriate workup for their chief complaint at this time and likelihood of emergent condition existing is low and thus workup can continue on an outpatient basis.. Their vital signs are stable. They have been counseled on decision, discharge, follow up and which symptoms necessitate immediate return to the emergency department.  They verbally stated understanding and agreement with plan and discharged in stable condition.      Merrily Pew, MD 07/13/15 [phone]

## 2015-07-13 NOTE — ED Notes (Signed)
Pt presents with c/o sore throat for the past three days. Pt reports she has been unable to taste anything and that has gotten progressively worse. Throat is painful when swallowing.

## 2015-07-15 LAB — CULTURE, GROUP A STREP (THRC)

## 2015-08-26 ENCOUNTER — Emergency Department (HOSPITAL_COMMUNITY): Payer: Medicaid Other

## 2015-08-26 ENCOUNTER — Encounter (HOSPITAL_COMMUNITY): Payer: Self-pay | Admitting: Emergency Medicine

## 2015-08-26 ENCOUNTER — Inpatient Hospital Stay (HOSPITAL_COMMUNITY)
Admission: EM | Admit: 2015-08-26 | Discharge: 2015-08-28 | DRG: 918 | Disposition: A | Discharge status: Home / Self Care | Payer: Medicaid Other | Attending: Internal Medicine | Admitting: Internal Medicine

## 2015-08-26 DIAGNOSIS — T454X1A Poisoning by iron and its compounds, accidental (unintentional), initial encounter: Secondary | ICD-10-CM

## 2015-08-26 DIAGNOSIS — T454X2A Poisoning by iron and its compounds, intentional self-harm, initial encounter: Principal | ICD-10-CM | POA: Diagnosis present

## 2015-08-26 DIAGNOSIS — K219 Gastro-esophageal reflux disease without esophagitis: Secondary | ICD-10-CM | POA: Diagnosis present

## 2015-08-26 DIAGNOSIS — E876 Hypokalemia: Secondary | ICD-10-CM | POA: Diagnosis present

## 2015-08-26 DIAGNOSIS — F4323 Adjustment disorder with mixed anxiety and depressed mood: Secondary | ICD-10-CM | POA: Diagnosis present

## 2015-08-26 DIAGNOSIS — Z87891 Personal history of nicotine dependence: Secondary | ICD-10-CM

## 2015-08-26 DIAGNOSIS — D509 Iron deficiency anemia, unspecified: Secondary | ICD-10-CM | POA: Diagnosis present

## 2015-08-26 DIAGNOSIS — T50902A Poisoning by unspecified drugs, medicaments and biological substances, intentional self-harm, initial encounter: Secondary | ICD-10-CM

## 2015-08-26 LAB — RAPID URINE DRUG SCREEN, HOSP PERFORMED
Amphetamines: NOT DETECTED
Barbiturates: NOT DETECTED
Benzodiazepines: NOT DETECTED
COCAINE: NOT DETECTED
OPIATES: NOT DETECTED
Tetrahydrocannabinol: NOT DETECTED

## 2015-08-26 LAB — COMPREHENSIVE METABOLIC PANEL
ALK PHOS: 65 U/L (ref 38–126)
ALT: 10 U/L — ABNORMAL LOW (ref 14–54)
ANION GAP: 7 (ref 5–15)
AST: 18 U/L (ref 15–41)
Albumin: 3.8 g/dL (ref 3.5–5.0)
BUN: 12 mg/dL (ref 6–20)
CO2: 24 mmol/L (ref 22–32)
Calcium: 9 mg/dL (ref 8.9–10.3)
Chloride: 106 mmol/L (ref 101–111)
Creatinine, Ser: 0.59 mg/dL (ref 0.44–1.00)
Glucose, Bld: 89 mg/dL (ref 65–99)
Potassium: 3.1 mmol/L — ABNORMAL LOW (ref 3.5–5.1)
Sodium: 137 mmol/L (ref 135–145)
TOTAL PROTEIN: 7.5 g/dL (ref 6.5–8.1)
Total Bilirubin: 0.7 mg/dL (ref 0.3–1.2)

## 2015-08-26 LAB — BLOOD GAS, VENOUS
Acid-Base Excess: 1.6 mmol/L (ref 0.0–2.0)
BICARBONATE: 25.6 meq/L — AB (ref 20.0–24.0)
FIO2: 0.21
O2 SAT: 78.8 %
PCO2 VEN: 40 mmHg — AB (ref 45.0–50.0)
PH VEN: 7.423 — AB (ref 7.250–7.300)
PO2 VEN: 47 mmHg — AB (ref 31.0–45.0)
Patient temperature: 98.6
TCO2: 24.1 mmol/L (ref 0–100)

## 2015-08-26 LAB — CBC
HCT: 28.9 % — ABNORMAL LOW (ref 36.0–46.0)
HEMOGLOBIN: 8.8 g/dL — AB (ref 12.0–15.0)
MCH: 20.8 pg — AB (ref 26.0–34.0)
MCHC: 30.4 g/dL (ref 30.0–36.0)
MCV: 68.2 fL — ABNORMAL LOW (ref 78.0–100.0)
PLATELETS: 385 10*3/uL (ref 150–400)
RBC: 4.24 MIL/uL (ref 3.87–5.11)
RDW: 17.6 % — AB (ref 11.5–15.5)
WBC: 9.8 10*3/uL (ref 4.0–10.5)

## 2015-08-26 LAB — ACETAMINOPHEN LEVEL

## 2015-08-26 LAB — CBG MONITORING, ED: Glucose-Capillary: 99 mg/dL (ref 65–99)

## 2015-08-26 LAB — I-STAT BETA HCG BLOOD, ED (MC, WL, AP ONLY)

## 2015-08-26 LAB — ETHANOL

## 2015-08-26 LAB — SALICYLATE LEVEL: Salicylate Lvl: <4 mg/dL (ref 2.8–30.0)

## 2015-08-26 LAB — POC URINE PREG, ED: Preg Test, Ur: NEGATIVE

## 2015-08-26 MED ORDER — ONDANSETRON HCL 4 MG/2ML IJ SOLN
4.0000 mg | Freq: Once | INTRAMUSCULAR | Status: AC
  Administered 2015-08-26: 4 mg via INTRAVENOUS
  Filled 2015-08-26: qty 2

## 2015-08-26 MED ORDER — SODIUM CHLORIDE 0.9 % IV BOLUS (SEPSIS)
1000.0000 mL | Freq: Once | INTRAVENOUS | Status: AC
  Administered 2015-08-26: 1000 mL via INTRAVENOUS

## 2015-08-26 NOTE — ED Notes (Signed)
Patient took 30 Iron supplements 28mg  about an hour ago per EMS. Patient denies SI/Hi per EMS.

## 2015-08-26 NOTE — ED Notes (Signed)
EKG given to EDP,Rees,MD., for review. 

## 2015-08-26 NOTE — ED Notes (Signed)
Bed: [id number] Expected date:  Expected time:  Means of arrival:  Comments: EMS 20yo F overdose on iron tab 30tabs approx 1 gm / SI

## 2015-08-26 NOTE — ED Provider Notes (Signed)
CSN: [id number]     Arrival date & time 08/26/15  2000 History   First MD Initiated Contact with Patient 08/26/15 2009     Chief Complaint  Patient presents with  . Ingestion     Patient is a 20 y.o. female presenting with Ingested Medication. The history is provided by the patient. No language interpreter was used.  Ingestion   OLLIE MCDAID is a 20 y.o. female who presents to the Emergency Department complaining of overdose.  She presents to the emergency department following an overdose on iron. She states that she had too much today and felt like she was having a meltdown. This occurred at work. She took an unknown quantity of iron, 28 mg tablets. She estimates she took about 30 but she did not count. The bottle started with 100 tablets in it and 37 are remaining. Currently she complains of nausea, abdominal cramping, not feeling well. She denies any prior overdose attempts.  Past Medical History  Diagnosis Date  . GERD (gastroesophageal reflux disease)   . BV (bacterial vaginosis)   . Normal pregnancy 09/17/2013   Past Surgical History  Procedure Laterality Date  . No past surgeries    . Wisdom tooth extraction     Family History  Problem Relation Age of Onset  . Asthma Sister   . Hearing loss Neg Hx    Social History  Substance Use Topics  . Smoking status: Former Research scientist (life sciences)  . Smokeless tobacco: Never Used     Comment: quit 2011  . Alcohol Use: No   OB History    Gravida Para Term Preterm AB TAB SAB Ectopic Multiple Living   1 1 1  0 0 0 0 0 0 1     Review of Systems  All other systems reviewed and are negative.     Allergies  Review of patient's allergies indicates no known allergies.  Home Medications   Prior to Admission medications   Medication Sig Start Date End Date Taking? Authorizing Provider  etonogestrel (NEXPLANON) 68 MG IMPL implant 1 each by Subdermal route once. Placed 08/2013   Yes Historical Provider, MD  magic mouthwash w/lidocaine SOLN Take 5  mLs by mouth 3 (three) times daily as needed for mouth pain. Patient not taking: Reported on 08/26/2015 07/13/15   Merrily Pew, MD   BP 118/63 mmHg  Pulse 97  Temp(Src) 98.1 F (36.7 C) (Oral)  Resp 9  SpO2 100%  LMP  Physical Exam  Constitutional: She is oriented to person, place, and time. She appears well-developed and well-nourished.  HENT:  Head: Normocephalic and atraumatic.  Eyes:  Pupils midsize and reactive, no nystagmus.  Cardiovascular: Normal rate and regular rhythm.   No murmur heard. Pulmonary/Chest: Effort normal and breath sounds normal. No respiratory distress.  Abdominal: Soft. There is no rebound and no guarding.  Hyperactive bowel sounds, mild epigastric tenderness  Musculoskeletal: She exhibits no edema or tenderness.  Neurological: She is alert and oriented to person, place, and time.  Skin: Skin is warm and dry.  Psychiatric:  Flat affect  Nursing note and vitals reviewed.   ED Course  Procedures (including critical care time) Labs Review Labs Reviewed  COMPREHENSIVE METABOLIC PANEL - Abnormal; Notable for the following:    Potassium 3.1 (*)    ALT 10 (*)    All other components within normal limits  ACETAMINOPHEN LEVEL - Abnormal; Notable for the following:    Acetaminophen (Tylenol), Serum <10 (*)    All other components  within normal limits  CBC - Abnormal; Notable for the following:    Hemoglobin 8.8 (*)    HCT 28.9 (*)    MCV 68.2 (*)    MCH 20.8 (*)    RDW 17.6 (*)    All other components within normal limits  BLOOD GAS, VENOUS - Abnormal; Notable for the following:    pH, Ven 7.423 (*)    pCO2, Ven 40.0 (*)    pO2, Ven 47.0 (*)    Bicarbonate 25.6 (*)    All other components within normal limits  FERRITIN - Abnormal; Notable for the following:    Ferritin 3 (*)    All other components within normal limits  ACETAMINOPHEN LEVEL - Abnormal; Notable for the following:    Acetaminophen (Tylenol), Serum <10 (*)    All other components  within normal limits  BASIC METABOLIC PANEL - Abnormal; Notable for the following:    Potassium 3.0 (*)    Calcium 8.8 (*)    All other components within normal limits  ETHANOL  SALICYLATE LEVEL  URINE RAPID DRUG SCREEN, HOSP PERFORMED  IRON  CBG MONITORING, ED  I-STAT BETA HCG BLOOD, ED ([facility], [facility], [facility] ONLY)  POC URINE PREG, ED    Imaging Review Dg Abd 1 View  08/27/2015  CLINICAL DATA:  Poison control protocol. EXAM: ABDOMEN - 1 VIEW COMPARISON:  None. FINDINGS: Normal abdominal gas pattern.  No abnormal calcifications. IMPRESSION: Negative. Electronically Signed   By: Andreas Newport M.D.   On: 08/27/2015 01:01   Dg Abd 1 View  08/26/2015  CLINICAL DATA:  Abdominal pain EXAM: ABDOMEN - 1 VIEW COMPARISON:  None. FINDINGS: There is no bowel dilatation or air-fluid level suggesting obstruction. No free air is evident on this supine examination. No abnormal calcifications. A small radiopaque foreign body is noted in or overlying the lateral right mid abdomen. Bony structures appear normal. IMPRESSION: Bowel gas pattern unremarkable. No obstruction or free air is seen on this supine examination. Small radiopaque foreign body in or overlying the lateral right mid abdomen of uncertain etiology. Electronically Signed   By: Lowella Grip III M.D.   On: 08/26/2015 21:59   I have personally reviewed and evaluated these images and lab results as part of my medical decision-making.   EKG Interpretation   Date/Time:  Thursday August 26 2015 20:18:03 EDT Ventricular Rate:  82 PR Interval:    QRS Duration: 78 QT Interval:  382 QTC Calculation: 447 R Axis:   76 Text Interpretation:  Sinus rhythm LAE, consider biatrial enlargement  Confirmed by Hazle Coca [phone]) on 08/26/2015 8:25:13 PM      MDM   Final diagnoses:  Overdose, intentional self-harm, initial encounter ([place])  Iron product overdose, intentional self-harm, initial encounter ([place])    Pt here for evaluation following overdose on  iron.  She is in no distress on initial eval.  Initial labs obtained per poison control recommendations.  Repeat labs recommended (per poison control) at four hours post ingestion (midnight) of tylenol, iron, electrolytes as well as repeat BMP.  If all studies are wnl she is cleared.  She has a hx/o anemia and Hgb is stable compared to priors.  Pt care transferred pending repeat assessment/repeat labs.   Due to concern for patient's safety with overdose of potentially lethal means IVC paper completed and psychiatric evaluation recommended pending medical clearance.      Quintella Reichert, MD 08/27/15 1236

## 2015-08-27 ENCOUNTER — Emergency Department (HOSPITAL_COMMUNITY): Payer: Medicaid Other

## 2015-08-27 ENCOUNTER — Encounter (HOSPITAL_COMMUNITY): Payer: Self-pay

## 2015-08-27 ENCOUNTER — Observation Stay (HOSPITAL_COMMUNITY): Payer: Medicaid Other

## 2015-08-27 DIAGNOSIS — K219 Gastro-esophageal reflux disease without esophagitis: Secondary | ICD-10-CM | POA: Diagnosis present

## 2015-08-27 DIAGNOSIS — T454X2A Poisoning by iron and its compounds, intentional self-harm, initial encounter: Secondary | ICD-10-CM | POA: Diagnosis present

## 2015-08-27 DIAGNOSIS — D509 Iron deficiency anemia, unspecified: Secondary | ICD-10-CM | POA: Diagnosis not present

## 2015-08-27 DIAGNOSIS — E876 Hypokalemia: Secondary | ICD-10-CM | POA: Diagnosis present

## 2015-08-27 DIAGNOSIS — T454X1A Poisoning by iron and its compounds, accidental (unintentional), initial encounter: Secondary | ICD-10-CM | POA: Diagnosis present

## 2015-08-27 DIAGNOSIS — Z87891 Personal history of nicotine dependence: Secondary | ICD-10-CM | POA: Diagnosis not present

## 2015-08-27 DIAGNOSIS — F4323 Adjustment disorder with mixed anxiety and depressed mood: Secondary | ICD-10-CM | POA: Diagnosis present

## 2015-08-27 DIAGNOSIS — T454X4A Poisoning by iron and its compounds, undetermined, initial encounter: Secondary | ICD-10-CM

## 2015-08-27 DIAGNOSIS — T454X2D Poisoning by iron and its compounds, intentional self-harm, subsequent encounter: Secondary | ICD-10-CM | POA: Diagnosis not present

## 2015-08-27 LAB — COMPREHENSIVE METABOLIC PANEL
ALK PHOS: 57 U/L (ref 38–126)
ALT: 9 U/L — ABNORMAL LOW (ref 14–54)
ANION GAP: 4 — AB (ref 5–15)
AST: 16 U/L (ref 15–41)
Albumin: 3.4 g/dL — ABNORMAL LOW (ref 3.5–5.0)
BUN: 10 mg/dL (ref 6–20)
CALCIUM: 9.1 mg/dL (ref 8.9–10.3)
CO2: 24 mmol/L (ref 22–32)
Chloride: 109 mmol/L (ref 101–111)
Creatinine, Ser: 0.44 mg/dL (ref 0.44–1.00)
Glucose, Bld: 93 mg/dL (ref 65–99)
Potassium: 3.3 mmol/L — ABNORMAL LOW (ref 3.5–5.1)
SODIUM: 137 mmol/L (ref 135–145)
TOTAL PROTEIN: 6.6 g/dL (ref 6.5–8.1)
Total Bilirubin: 0.5 mg/dL (ref 0.3–1.2)

## 2015-08-27 LAB — MRSA PCR SCREENING: MRSA by PCR: POSITIVE — AB

## 2015-08-27 LAB — FERRITIN
FERRITIN: 3 ng/mL — AB (ref 11–307)
FERRITIN: 9 ng/mL — AB (ref 11–307)

## 2015-08-27 LAB — BASIC METABOLIC PANEL
Anion gap: 5 (ref 5–15)
BUN: 12 mg/dL (ref 6–20)
CALCIUM: 8.8 mg/dL — AB (ref 8.9–10.3)
CO2: 23 mmol/L (ref 22–32)
CREATININE: 0.47 mg/dL (ref 0.44–1.00)
Chloride: 110 mmol/L (ref 101–111)
GFR calc non Af Amer: >60 mL/min (ref >60)
Glucose, Bld: 96 mg/dL (ref 65–99)
Potassium: 3 mmol/L — ABNORMAL LOW (ref 3.5–5.1)
SODIUM: 138 mmol/L (ref 135–145)

## 2015-08-27 LAB — IRON
IRON: 376 ug/dL — AB (ref 28–170)
Iron: 406 ug/dL — ABNORMAL HIGH (ref 28–170)

## 2015-08-27 LAB — CBG MONITORING, ED: Glucose-Capillary: 86 mg/dL (ref 65–99)

## 2015-08-27 LAB — ACETAMINOPHEN LEVEL: Acetaminophen (Tylenol), Serum: <10 ug/mL — ABNORMAL LOW (ref 10–30)

## 2015-08-27 MED ORDER — POTASSIUM CHLORIDE 10 MEQ/100ML IV SOLN
10.0000 meq | INTRAVENOUS | Status: AC
  Administered 2015-08-27 (×4): 10 meq via INTRAVENOUS
  Filled 2015-08-27 (×4): qty 100

## 2015-08-27 MED ORDER — SODIUM CHLORIDE 0.9% FLUSH
3.0000 mL | Freq: Two times a day (BID) | INTRAVENOUS | Status: DC
  Administered 2015-08-27 – 2015-08-28 (×2): 3 mL via INTRAVENOUS

## 2015-08-27 MED ORDER — POTASSIUM CHLORIDE 20 MEQ/15ML (10%) PO SOLN: 40.0000 meq | Freq: Once | ORAL | Status: DC

## 2015-08-27 MED ORDER — FAMOTIDINE IN NACL 20-0.9 MG/50ML-% IV SOLN
20.0000 mg | Freq: Two times a day (BID) | INTRAVENOUS | Status: DC
  Administered 2015-08-27 – 2015-08-28 (×3): 20 mg via INTRAVENOUS
  Filled 2015-08-27 (×3): qty 50

## 2015-08-27 MED ORDER — ONDANSETRON HCL 4 MG/2ML IJ SOLN: 4.0000 mg | Freq: Three times a day (TID) | INTRAMUSCULAR | Status: DC | PRN

## 2015-08-27 MED ORDER — ENOXAPARIN SODIUM 40 MG/0.4ML ~~LOC~~ SOLN
40.0000 mg | SUBCUTANEOUS | Status: DC
  Administered 2015-08-28: 40 mg via SUBCUTANEOUS
  Filled 2015-08-27 (×2): qty 0.4

## 2015-08-27 MED ORDER — SODIUM CHLORIDE 0.9 % IV SOLN
INTRAVENOUS | Status: DC
  Administered 2015-08-27 – 2015-08-28 (×4): via INTRAVENOUS

## 2015-08-27 NOTE — ED Notes (Signed)
Per MD patient can not eat.
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
## 2015-08-27 NOTE — ED Notes (Signed)
Talked to Shanon Brow at [employer]. He is going to consult with toxicologist and call me back.

## 2015-08-27 NOTE — Progress Notes (Signed)
PROGRESS NOTE    SKYELER Gibson  [id number] DOB: 1995-10-23 DOA: 08/26/2015 PCP: No PCP Per Patient  Outpatient Specialists:   Brief Narrative: AS PER DR. Ivor Costa - 20 y.o. female with medical history significant of GERD and anemia, who presents with overdose of iron pills. She states that she had too much today and felt like she was having a meltdown. She took an unknown quantity of iron, 28 mg tablets at about 7:30 to 8:00 PM. She estimates she took about 30 but she did not count. Per report, the bottle started with 100 tablets in it and 37 are remaining. Patient has nausea, but no vomiting or diarrhea. She has abdominal cramping initially, which has resolved currently. She does not have chest pain, cough, shortness of breath, fever, chills, symptoms of UTI or unilateral weakness. She denies suicidal or homicidal ideations. She states that she does not know why she took many iron pills.  Assessment & Plan:   Principal Problem:   Overdose of iron or iron compound Active Problems:   GERD (gastroesophageal reflux disease)   Microcytic anemia  Principal Problem:  Overdose of iron or iron compound Active Problems:  GERD (gastroesophageal reflux disease)  Microcytic anemia   Intentional overdose on iron or iron compound: Regrets the action. Overdose was not planned. Reported financial stress and challenges looking after her son. Psychiatry consulted. On 1:1. Discussed patient's progress with the poison control. Continue supportive care until cleared by Psychiatry.  Hypokalemia: K=3.3. Kcl 40 MEQ po x 1. Repeat CMP in am.   GERD (gastroesophageal reflux disease) -IV pepcid  Microcytic anemia  DVT ppx: SQ Lovenox Code Status: Full code Family Communication: None at bed side.  Disposition Plan: Anticipate discharge back to previous home environment Consults called: Poison control Admission status:SDU/obs  Subjective: Denies suicidal ideations or plans. No GI symptoms. Repeat  CMP and Abdominal Xray results reviewed.  Objective: Filed Vitals:   08/27/15 0746 08/27/15 0800 08/27/15 0900 08/27/15 0915  BP: 118/71 109/76 105/59 105/73  Pulse: 66 80 68 84  Temp:    98.5 F (36.9 C)  TempSrc:    Oral  Resp: 12 14 13 16   SpO2: 94% 100% 98% 100%   No intake or output data in the 24 hours ending 08/27/15 0953 There were no vitals filed for this visit.  Examination:  General exam: Pallor. No jaundice. Appears calm and comfortable  Respiratory system: Clear to auscultation. Respiratory effort normal. Cardiovascular system: S1 & S2 heard, RRR. No JVD Gastrointestinal system: Abdomen is nondistended, soft and nontender.   Central nervous system: Alert and oriented. Moves all limbs. Extremities: No leg edema.  Data Reviewed: I have personally reviewed following labs and imaging studies  CBC:  Recent Labs Lab 08/26/15 2112  WBC 9.8  HGB 8.8*  HCT 28.9*  MCV 68.2*  PLT 0000000   Basic Metabolic Panel:  Recent Labs Lab 08/26/15 2112 08/27/15 0049 08/27/15 0658  NA 137 138 137  K 3.1* 3.0* 3.3*  CL 106 110 109  CO2 24 23 24   GLUCOSE 89 96 93  BUN 12 12 10   CREATININE 0.59 0.47 0.44  CALCIUM 9.0 8.8* 9.1   GFR: CrCl cannot be calculated (Unknown ideal weight.). Liver Function Tests:  Recent Labs Lab 08/26/15 2112 08/27/15 0658  AST 18 16  ALT 10* 9*  ALKPHOS 65 57  BILITOT 0.7 0.5  PROT 7.5 6.6  ALBUMIN 3.8 3.4*   No results for input(s): LIPASE, AMYLASE in the last 168  hours. No results for input(s): AMMONIA in the last 168 hours. Coagulation Profile: No results for input(s): INR, PROTIME in the last 168 hours. Cardiac Enzymes: No results for input(s): CKTOTAL, CKMB, CKMBINDEX, TROPONINI in the last 168 hours. BNP (last 3 results) No results for input(s): PROBNP in the last 8760 hours. HbA1C: No results for input(s): HGBA1C in the last 72 hours. CBG:  Recent Labs Lab 08/26/15 2020 08/27/15 0914  GLUCAP 99 86   Lipid  Profile: No results for input(s): CHOL, HDL, LDLCALC, TRIG, CHOLHDL, LDLDIRECT in the last 72 hours. Thyroid Function Tests: No results for input(s): TSH, T4TOTAL, FREET4, T3FREE, THYROIDAB in the last 72 hours. Anemia Panel:  Recent Labs  08/26/15 2109 08/27/15 0049  FERRITIN 3*  --   IRON  --  406*   Urine analysis:    Component Value Date/Time   COLORURINE YELLOW 06/06/2015 2010   APPEARANCEUR CLEAR 06/06/2015 2010   LABSPEC 1.015 06/06/2015 2010   PHURINE 7.5 06/06/2015 2010   GLUCOSEU NEGATIVE 06/06/2015 2010   HGBUR NEGATIVE 06/06/2015 2010   [place] NEGATIVE 06/06/2015 2010   [place] NEGATIVE 06/06/2015 2010   PROTEINUR NEGATIVE 06/06/2015 2010   UROBILINOGEN 1.0 12/23/2014 1824   NITRITE NEGATIVE 06/06/2015 2010   LEUKOCYTESUR NEGATIVE 06/06/2015 2010   Sepsis Labs: @LABRCNTIP (procalcitonin:4,lacticidven:4)  )No results found for this or any previous visit (from the past 240 hour(s)).       Radiology Studies: Dg Abd 1 View  08/27/2015  CLINICAL DATA:  Overdose of iron. EXAM: ABDOMEN - 1 VIEW COMPARISON:  Radiograph of same day. FINDINGS: The bowel gas pattern is normal. No radio-opaque calculi or other significant radiographic abnormality are seen. IMPRESSION: No evidence of bowel obstruction or ileus. No radiopaque foreign body seen. Electronically Signed   By: Marijo Conception, M.D.   On: 08/27/2015 07:27   Dg Abd 1 View  08/27/2015  CLINICAL DATA:  Poison control protocol. EXAM: ABDOMEN - 1 VIEW COMPARISON:  None. FINDINGS: Normal abdominal gas pattern.  No abnormal calcifications. IMPRESSION: Negative. Electronically Signed   By: Andreas Newport M.D.   On: 08/27/2015 01:01   Dg Abd 1 View  08/26/2015  CLINICAL DATA:  Abdominal pain EXAM: ABDOMEN - 1 VIEW COMPARISON:  None. FINDINGS: There is no bowel dilatation or air-fluid level suggesting obstruction. No free air is evident on this supine examination. No abnormal calcifications. A small radiopaque  foreign body is noted in or overlying the lateral right mid abdomen. Bony structures appear normal. IMPRESSION: Bowel gas pattern unremarkable. No obstruction or free air is seen on this supine examination. Small radiopaque foreign body in or overlying the lateral right mid abdomen of uncertain etiology. Electronically Signed   By: Lowella Grip III M.D.   On: 08/26/2015 21:59        Scheduled Meds: . enoxaparin (LOVENOX) injection  40 mg Subcutaneous Q24H  . sodium chloride flush  3 mL Intravenous Q12H   Continuous Infusions: . sodium chloride 125 mL/hr at 08/27/15 0719  . famotidine (PEPCID) IV    . potassium chloride 10 mEq (08/27/15 0926)        Time spent: 25 Mins    Dana Allan, MD  [facility] Pager #: [phone] 7PM-7AM contact night coverage as above

## 2015-08-27 NOTE — ED Notes (Signed)
Shanon Brow from [employer] wants repeat ferrin and repeat cmp. Continue to watch patient until 0700 labs are back. Please call poison control [phone].

## 2015-08-27 NOTE — ED Provider Notes (Signed)
Per Dr. Blaine Hamper admit to OBS step down.  730 labs ordered.  Maintain NPO  Rashidi Loh, MD 08/27/15 [phone]

## 2015-08-27 NOTE — H&P (Signed)
History and Physical    Kimberly Gibson [id number] DOB: 1995/07/26 DOA: 08/26/2015  Referring MD/NP/PA:   PCP: No PCP Per Patient   Patient coming from:  The patient is coming from home.  At baseline, pt is independent for her ADL.    Chief Complaint: overdose  HPI: Kimberly Gibson is a 20 y.o. female with medical history significant of GERD and anemia, who presents with overdose of iron pills.  She states that she had too much today and felt like she was having a meltdown. She took an unknown quantity of iron, 28 mg tablets at about 7:30 to 8:00 PM. She estimates she took about 30 but she did not count. Per report, the bottle started with 100 tablets in it and 37 are remaining. Patient has nausea, but no vomiting or diarrhea. She has abdominal cramping initially, which has resolved currently. She does not have chest pain, cough, shortness of breath, fever, chills, symptoms of UTI or unilateral weakness. She denies suicidal or homicidal ideations. She states that she does not know why she took many iron pills.  ED Course: pt was found to have iron level 406, ferritin 3, WBC 9.8, negative UDS, negative pregnancy test, hemoglobin 8.8, temperature normal, no tachycardia, no tachypnea, oxygen saturation normal, blood pressure normal, potassium 3.0, anion gap normal, bicarbonate 24, creatinine normal. First KUB at 21:45 showed small radiopaque foreign body in or overlying the lateral right mid abdomen, and second KUB at 00:39 showed no radiopaque foreign body. Poison control was consulted.  Review of Systems:   General: no fevers, chills, no changes in body weight, has fatigue HEENT: no blurry vision, hearing changes or sore throat Pulm: no dyspnea, coughing, wheezing CV: no chest pain, no palpitations Abd: has nausea and abdominal cramps. No vomiting, diarrhea, constipation GU: no dysuria, burning on urination, increased urinary frequency, hematuria  Ext: no leg edema Neuro: no unilateral  weakness, numbness, or tingling, no vision change or hearing loss Skin: no rash MSK: No muscle spasm, no deformity, no limitation of range of movement in spin Heme: No easy bruising.  Travel history: No recent long distant travel.  Allergy: No Known Allergies  Past Medical History  Diagnosis Date  . GERD (gastroesophageal reflux disease)   . BV (bacterial vaginosis)   . Normal pregnancy 09/17/2013    Past Surgical History  Procedure Laterality Date  . No past surgeries    . Wisdom tooth extraction      Social History:  reports that she has quit smoking. She has never used smokeless tobacco. She reports that she does not drink alcohol or use illicit drugs.  Family History:  Family History  Problem Relation Age of Onset  . Asthma Sister   . Hearing loss Neg Hx      Prior to Admission medications   Medication Sig Start Date End Date Taking? Authorizing Provider  etonogestrel (NEXPLANON) 68 MG IMPL implant 1 each by Subdermal route once. Placed 08/2013   Yes Historical Provider, MD  magic mouthwash w/lidocaine SOLN Take 5 mLs by mouth 3 (three) times daily as needed for mouth pain. Patient not taking: Reported on 08/26/2015 07/13/15   Merrily Pew, MD    Physical Exam: Filed Vitals:   08/27/15 0200 08/27/15 0300 08/27/15 0330 08/27/15 0430  BP: 111/78 105/67 108/50 100/54  Pulse:  95 73 67  Temp:      TempSrc:      Resp: 17 17 14 14   SpO2:  100% 100% 100%  General: Not in acute distress HEENT:       Eyes: PERRL, EOMI, no scleral icterus.       ENT: No discharge from the ears and nose, no pharynx injection, no tonsillar enlargement.        Neck: No JVD, no bruit, no mass felt. Heme: No neck lymph node enlargement. Cardiac: S1/S2, RRR, No murmurs, No gallops or rubs. Pulm: No rales, wheezing, rhonchi or rubs. Abd: Soft, nondistended, nontender, no rebound pain, no organomegaly, BS present. GU: No hematuria Ext: No pitting leg edema bilaterally. 2+DP/PT pulse  bilaterally. Musculoskeletal: No joint deformities, No joint redness or warmth, no limitation of ROM in spin. Skin: No rashes.  Neuro: Alert, oriented X3, cranial nerves II-XII grossly intact, moves all extremities normally. Psych: Patient is in depressed mood. No suicidal or hemocidal ideation.   Labs on Admission: I have personally reviewed following labs and imaging studies  CBC:  Recent Labs Lab 08/26/15 2112  WBC 9.8  HGB 8.8*  HCT 28.9*  MCV 68.2*  PLT 0000000   Basic Metabolic Panel:  Recent Labs Lab 08/26/15 2112 08/27/15 0049  NA 137 138  K 3.1* 3.0*  CL 106 110  CO2 24 23  GLUCOSE 89 96  BUN 12 12  CREATININE 0.59 0.47  CALCIUM 9.0 8.8*   GFR: CrCl cannot be calculated (Unknown ideal weight.). Liver Function Tests:  Recent Labs Lab 08/26/15 2112  AST 18  ALT 10*  ALKPHOS 65  BILITOT 0.7  PROT 7.5  ALBUMIN 3.8   No results for input(s): LIPASE, AMYLASE in the last 168 hours. No results for input(s): AMMONIA in the last 168 hours. Coagulation Profile: No results for input(s): INR, PROTIME in the last 168 hours. Cardiac Enzymes: No results for input(s): CKTOTAL, CKMB, CKMBINDEX, TROPONINI in the last 168 hours. BNP (last 3 results) No results for input(s): PROBNP in the last 8760 hours. HbA1C: No results for input(s): HGBA1C in the last 72 hours. CBG:  Recent Labs Lab 08/26/15 2020  GLUCAP 99   Lipid Profile: No results for input(s): CHOL, HDL, LDLCALC, TRIG, CHOLHDL, LDLDIRECT in the last 72 hours. Thyroid Function Tests: No results for input(s): TSH, T4TOTAL, FREET4, T3FREE, THYROIDAB in the last 72 hours. Anemia Panel:  Recent Labs  08/26/15 2109 08/27/15 0049  FERRITIN 3*  --   IRON  --  406*   Urine analysis:    Component Value Date/Time   COLORURINE YELLOW 06/06/2015 2010   APPEARANCEUR CLEAR 06/06/2015 2010   LABSPEC 1.015 06/06/2015 2010   PHURINE 7.5 06/06/2015 2010   GLUCOSEU NEGATIVE 06/06/2015 2010   HGBUR NEGATIVE  06/06/2015 2010   [place] NEGATIVE 06/06/2015 2010   [place] NEGATIVE 06/06/2015 2010   PROTEINUR NEGATIVE 06/06/2015 2010   UROBILINOGEN 1.0 12/23/2014 1824   NITRITE NEGATIVE 06/06/2015 2010   LEUKOCYTESUR NEGATIVE 06/06/2015 2010   Sepsis Labs: @LABRCNTIP (procalcitonin:4,lacticidven:4) )No results found for this or any previous visit (from the past 240 hour(s)).   Radiological Exams on Admission: Dg Abd 1 View  08/27/2015  CLINICAL DATA:  Poison control protocol. EXAM: ABDOMEN - 1 VIEW COMPARISON:  None. FINDINGS: Normal abdominal gas pattern.  No abnormal calcifications. IMPRESSION: Negative. Electronically Signed   By: Andreas Newport M.D.   On: 08/27/2015 01:01   Dg Abd 1 View  08/26/2015  CLINICAL DATA:  Abdominal pain EXAM: ABDOMEN - 1 VIEW COMPARISON:  None. FINDINGS: There is no bowel dilatation or air-fluid level suggesting obstruction. No free air is evident on this supine  examination. No abnormal calcifications. A small radiopaque foreign body is noted in or overlying the lateral right mid abdomen. Bony structures appear normal. IMPRESSION: Bowel gas pattern unremarkable. No obstruction or free air is seen on this supine examination. Small radiopaque foreign body in or overlying the lateral right mid abdomen of uncertain etiology. Electronically Signed   By: Lowella Grip III M.D.   On: 08/26/2015 21:59     EKG: Independently reviewed. Sinus rhythm, QTC 447, nonspecific T-wave change.  Assessment/Plan Principal Problem:   Overdose of iron or iron compound Active Problems:   GERD (gastroesophageal reflux disease)   Microcytic anemia   Overdose of iron or iron compound: Patient denies suicidal or homicidal ideations, but the patient seems to be in depressed mood. Initial iron level 406, ferritin 3. EDP consulted poison control. David from [employer] [facility] wants to repeat ferrin, iron, CMP and KUB at 7:00, which were ordered, and continue to watch patient until 0700  labs are back. Currently hemodynamically stable. Mental status okay.  -will admit to SUD in case pt needs deferoxamine treatment -Supportive care, IV fluid -Please call poison control [phone] when these lab results are available in AM. -start IV pepcid -When necessary Zofran for nausea -Sitter at the bedside -suicide precaution -Please call psych in morning -repeat EKG in am  Hypokalemia: K=3.1 on admission. - Repleted by IV - Check Mg level  GERD (gastroesophageal reflux disease) -IV pepcid  Microcytic anemia: Ferritin 3, indicating area insufficiency. Now has iron overdose. -f/u by CBC  DVT ppx: SQ Lovenox Code Status: Full code Family Communication: None at bed side.  Disposition Plan:  Anticipate discharge back to previous home environment Consults called:  Poison control Admission status:SDU/obs  Date of Service 08/27/2015    Ivor Costa [facility] Pager [phone]  If 7PM-7AM, please contact night-coverage [email or web address] Password [email or web address] 08/27/2015, 5:11 AM

## 2015-08-28 DIAGNOSIS — T454X2A Poisoning by iron and its compounds, intentional self-harm, initial encounter: Principal | ICD-10-CM

## 2015-08-28 DIAGNOSIS — T454X2D Poisoning by iron and its compounds, intentional self-harm, subsequent encounter: Secondary | ICD-10-CM

## 2015-08-28 DIAGNOSIS — F4323 Adjustment disorder with mixed anxiety and depressed mood: Secondary | ICD-10-CM

## 2015-08-28 DIAGNOSIS — D509 Iron deficiency anemia, unspecified: Secondary | ICD-10-CM

## 2015-08-28 DIAGNOSIS — K219 Gastro-esophageal reflux disease without esophagitis: Secondary | ICD-10-CM

## 2015-08-28 LAB — CBC
HCT: 27.5 % — ABNORMAL LOW (ref 36.0–46.0)
HEMOGLOBIN: 8.3 g/dL — AB (ref 12.0–15.0)
MCH: 21.3 pg — AB (ref 26.0–34.0)
MCHC: 30.2 g/dL (ref 30.0–36.0)
MCV: 70.7 fL — ABNORMAL LOW (ref 78.0–100.0)
Platelets: 355 10*3/uL (ref 150–400)
RBC: 3.89 MIL/uL (ref 3.87–5.11)
RDW: 17.9 % — ABNORMAL HIGH (ref 11.5–15.5)
WBC: 7.8 10*3/uL (ref 4.0–10.5)

## 2015-08-28 LAB — GLUCOSE, CAPILLARY
GLUCOSE-CAPILLARY: 130 mg/dL — AB (ref 65–99)
GLUCOSE-CAPILLARY: 58 mg/dL — AB (ref 65–99)

## 2015-08-28 LAB — MAGNESIUM: Magnesium: 1.5 mg/dL — ABNORMAL LOW (ref 1.7–2.4)

## 2015-08-28 MED ORDER — MAGNESIUM SULFATE 2 GM/50ML IV SOLN
2.0000 g | Freq: Once | INTRAVENOUS | Status: AC
  Administered 2015-08-28: 2 g via INTRAVENOUS
  Filled 2015-08-28: qty 50

## 2015-08-28 MED ORDER — FLUOXETINE HCL 10 MG PO CAPS: 10.0000 mg | ORAL_CAPSULE | Freq: Every day | ORAL | Status: DC

## 2015-08-28 MED ORDER — FLUOXETINE HCL 10 MG PO CAPS
10.0000 mg | ORAL_CAPSULE | Freq: Every day | ORAL | Status: DC
Start: 2015-08-28 — End: 2015-08-28
  Filled 2015-08-28: qty 1

## 2015-08-28 MED ORDER — DEXTROSE 50 % IV SOLN
INTRAVENOUS | Status: AC
  Administered 2015-08-28: 25 mL
  Filled 2015-08-28: qty 50

## 2015-08-28 MED ORDER — POTASSIUM CHLORIDE CRYS ER 20 MEQ PO TBCR
40.0000 meq | EXTENDED_RELEASE_TABLET | Freq: Once | ORAL | Status: AC
  Administered 2015-08-28: 40 meq via ORAL
  Filled 2015-08-28: qty 2

## 2015-08-28 NOTE — Discharge Summary (Signed)
Discharge Summary  Kimberly Gibson [id number] DOB: [birth year]  PCP: No PCP Per Patient  Admit date: 08/26/2015 Discharge date: 08/28/2015  Time spent: 25 minutes   Recommendations for Outpatient Follow-up:  1. New medication: Prozac 10 mg by mouth daily 2. Patient will be given referral to establish with outpatient counseling 3. Patient will begin referral to establish with outpatient PCP   Discharge Diagnoses:  Active Hospital Problems   Diagnosis Date Noted  . Overdose of iron or iron compound 08/27/2015  . Adjustment disorder with mixed anxiety and depressed mood 08/28/2015  . GERD (gastroesophageal reflux disease) 08/27/2015  . Microcytic anemia 08/27/2015    Resolved Hospital Problems   Diagnosis Date Noted Date Resolved  No resolved problems to display.    Discharge Condition: Improved, being discharged home   Diet recommendation: Regular diet   Filed Vitals:   08/28/15 1100 08/28/15 1145  BP:    Pulse: 92   Temp:  98.1 F (36.7 C)  Resp: 20     History of present illness:  20 year old female with past history of microcytic anemia admitted on the early morning of 6/30 after taking a large number of iron tablets the night before after feeling very stressed. In the emergency room, labs noteworthy for a elevated iron level of 406. In addition, there was a questionable finding of a foreign body on initial x-ray of her abdomen, however repeat x-ray noted no such foreign body seen. Patient denied suicidal ideation. She stated that she just felt overwhelmed. Case discussed with poison control.  Hospital Course:  Principal Problem:   Overdose of iron or iron compound: Patient brought into hospitalist service. Monitored all day and by morning of 7/1, iron levels slightly decreased into 380s. No other issues. Discussed with poison control who recommended no further monitoring needed. Patient cleared to start by mouth intake Active Problems:   GERD (gastroesophageal reflux  disease)   Microcytic anemia: Stable   Adjustment disorder with mixed anxiety and depressed mood: Seen by psychiatry on 7/1. Patient felt to not be at imminent risk to self or others at present. Recommendation for supportive therapy for ongoing stressors. Recommendation for referral to outpatient psychiatry/therapist on discharge. Patient started on Prozac 10 mg daily.   Procedures:  None   Consultations:  Psychiatry   Discharge Exam: BP 100/52 mmHg  Pulse 92  Temp(Src) 98.1 F (36.7 C) (Oral)  Resp 20  Ht 5\' 5"  (1.651 m)  Wt 66.8 kg (147 lb 4.3 oz)  BMI 24.51 kg/m2  SpO2 100%  LMP   General: Alert and oriented 3, no acute distress  Cardiovascular: Regular rate and rhythm, S1-S2  Respiratory: Clear to auscultation bilaterally   Discharge Instructions You were cared for by a hospitalist during your hospital stay. If you have any questions about your discharge medications or the care you received while you were in the hospital after you are discharged, you can call the unit and asked to speak with the hospitalist on call if the hospitalist that took care of you is not available. Once you are discharged, your primary care physician will handle any further medical issues. Please note that NO REFILLS for any discharge medications will be authorized once you are discharged, as it is imperative that you return to your primary care physician (or establish a relationship with a primary care physician if you do not have one) for your aftercare needs so that they can reassess your need for medications and monitor your lab values.  Discharge  Instructions    Diet general    Complete by:  As directed      Increase activity slowly    Complete by:  As directed             Medication List    STOP taking these medications        magic mouthwash w/lidocaine Soln      TAKE these medications        FLUoxetine 10 MG capsule  Commonly known as:  PROZAC  Take 1 capsule (10 mg total) by  mouth daily.     NEXPLANON 68 MG Impl implant  Generic drug:  etonogestrel  1 each by Subdermal route once. Placed 08/2013       No Known Allergies    The results of significant diagnostics from this hospitalization (including imaging, microbiology, ancillary and laboratory) are listed below for reference.    Significant Diagnostic Studies: Dg Abd 1 View  08/27/2015  CLINICAL DATA:  Overdose of iron. EXAM: ABDOMEN - 1 VIEW COMPARISON:  Radiograph of same day. FINDINGS: The bowel gas pattern is normal. No radio-opaque calculi or other significant radiographic abnormality are seen. IMPRESSION: No evidence of bowel obstruction or ileus. No radiopaque foreign body seen. Electronically Signed   By: Marijo Conception, M.D.   On: 08/27/2015 07:27   Dg Abd 1 View  08/27/2015  CLINICAL DATA:  Poison control protocol. EXAM: ABDOMEN - 1 VIEW COMPARISON:  None. FINDINGS: Normal abdominal gas pattern.  No abnormal calcifications. IMPRESSION: Negative. Electronically Signed   By: Andreas Newport M.D.   On: 08/27/2015 01:01   Dg Abd 1 View  08/26/2015  CLINICAL DATA:  Abdominal pain EXAM: ABDOMEN - 1 VIEW COMPARISON:  None. FINDINGS: There is no bowel dilatation or air-fluid level suggesting obstruction. No free air is evident on this supine examination. No abnormal calcifications. A small radiopaque foreign body is noted in or overlying the lateral right mid abdomen. Bony structures appear normal. IMPRESSION: Bowel gas pattern unremarkable. No obstruction or free air is seen on this supine examination. Small radiopaque foreign body in or overlying the lateral right mid abdomen of uncertain etiology. Electronically Signed   By: Lowella Grip III M.D.   On: 08/26/2015 21:59    Microbiology: Recent Results (from the past 240 hour(s))  MRSA PCR Screening     Status: Abnormal   Collection Time: 08/27/15 12:49 PM  Result Value Ref Range Status   MRSA by PCR POSITIVE (A) NEGATIVE Final    Comment:         The GeneXpert MRSA Assay (FDA approved for NASAL specimens only), is one component of a comprehensive MRSA colonization surveillance program. It is not intended to diagnose MRSA infection nor to guide or monitor treatment for MRSA infections. RESULT CALLED TO, READ BACK BY AND VERIFIED WITH: ALDRIDGE,D RN AT 1534 ON 6.30.17 BY EPPERSON,S      Labs: Basic Metabolic Panel:  Recent Labs Lab 08/26/15 2112 08/27/15 0049 08/27/15 0658 08/28/15 0338  NA 137 138 137  --   K 3.1* 3.0* 3.3*  --   CL 106 110 109  --   CO2 24 23 24   --   GLUCOSE 89 96 93  --   BUN 12 12 10   --   CREATININE 0.59 0.47 0.44  --   CALCIUM 9.0 8.8* 9.1  --   MG  --   --   --  1.5*   Liver Function Tests:  Recent Labs Lab  08/26/15 2112 08/27/15 0658  AST 18 16  ALT 10* 9*  ALKPHOS 65 57  BILITOT 0.7 0.5  PROT 7.5 6.6  ALBUMIN 3.8 3.4*   No results for input(s): LIPASE, AMYLASE in the last 168 hours. No results for input(s): AMMONIA in the last 168 hours. CBC:  Recent Labs Lab 08/26/15 2112 08/28/15 0338  WBC 9.8 7.8  HGB 8.8* 8.3*  HCT 28.9* 27.5*  MCV 68.2* 70.7*  PLT 385 355   Cardiac Enzymes: No results for input(s): CKTOTAL, CKMB, CKMBINDEX, TROPONINI in the last 168 hours. BNP: BNP (last 3 results) No results for input(s): BNP in the last 8760 hours.  ProBNP (last 3 results) No results for input(s): PROBNP in the last 8760 hours.  CBG:  Recent Labs Lab 08/26/15 2020 08/27/15 0914 08/28/15 0848 08/28/15 0928  GLUCAP 99 86 58* 130*       Signed:  Annita Brod, MD [facility] 08/28/2015, 3:47 PM

## 2015-08-28 NOTE — Progress Notes (Signed)
Formal follow. 20 year old female who took an unknown quantity of iron tablets in the evening of 6/29. Iron level initially elevated on admission. No loss of consciousness or other symptoms. Monitored in stepdown unit.  Follow-up today with poison control noting that iron level slightly trending downward. They felt that patient will be fine and does not need any further monitoring. Stable from medical standpoint. Awaiting psychiatry evaluation. Transfer to medical floor.

## 2015-08-28 NOTE — Progress Notes (Signed)
Hypoglycemic Event  CBG: 58  Treatment: D50 IV 25 mL  Symptoms: Hungry  Follow-up CBG: [id number] CBG Result:130  Possible Reasons for Event: Inadequate meal intake  Comments/MD notified:Dr. [name] notified    Mendel Corning
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
## 2015-12-04 ENCOUNTER — Encounter (HOSPITAL_COMMUNITY): Payer: Self-pay

## 2015-12-04 ENCOUNTER — Emergency Department (HOSPITAL_COMMUNITY): Payer: Self-pay

## 2015-12-04 ENCOUNTER — Emergency Department (HOSPITAL_COMMUNITY)
Admission: EM | Admit: 2015-12-04 | Discharge: 2015-12-04 | Disposition: A | Discharge status: Home / Self Care | Payer: Self-pay | Attending: Emergency Medicine | Admitting: Emergency Medicine

## 2015-12-04 DIAGNOSIS — Z87891 Personal history of nicotine dependence: Secondary | ICD-10-CM | POA: Insufficient documentation

## 2015-12-04 DIAGNOSIS — Z7982 Long term (current) use of aspirin: Secondary | ICD-10-CM | POA: Insufficient documentation

## 2015-12-04 DIAGNOSIS — M79641 Pain in right hand: Secondary | ICD-10-CM | POA: Insufficient documentation

## 2015-12-04 DIAGNOSIS — M546 Pain in thoracic spine: Secondary | ICD-10-CM | POA: Insufficient documentation

## 2015-12-04 DIAGNOSIS — M25531 Pain in right wrist: Secondary | ICD-10-CM | POA: Insufficient documentation

## 2015-12-04 MED ORDER — HYDROCODONE-ACETAMINOPHEN 5-325 MG PO TABS: 1.0000 | ORAL_TABLET | ORAL | 0 refills | Status: DC | PRN
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
## 2015-12-04 NOTE — ED Notes (Signed)
Patient transported to X-ray 

## 2015-12-04 NOTE — Progress Notes (Signed)
Orthopedic Tech Progress Note Patient Details:  Kimberly Gibson [birth year] [id number]  Ortho Devices Type of Ortho Device: Thumb velcro splint Ortho Device/Splint Interventions: Application   [name] 12/04/2015, 2:02 PM

## 2015-12-04 NOTE — ED Notes (Addendum)
PA at bedside.

## 2015-12-04 NOTE — ED Notes (Signed)
Pt. Verbalized understanding of teaching with no questions. VSS NAD. Pt sister picking her up.
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
## 2015-12-06 LAB — POC URINE PREG, ED: Preg Test, Ur: NEGATIVE

## 2015-12-19 ENCOUNTER — Emergency Department (HOSPITAL_COMMUNITY): Payer: Worker's Compensation

## 2015-12-19 ENCOUNTER — Encounter (HOSPITAL_COMMUNITY): Payer: Self-pay

## 2015-12-19 ENCOUNTER — Emergency Department (HOSPITAL_COMMUNITY)
Admission: EM | Admit: 2015-12-19 | Discharge: 2015-12-19 | Disposition: A | Discharge status: Home / Self Care | Payer: Worker's Compensation | Attending: Emergency Medicine | Admitting: Emergency Medicine

## 2015-12-19 DIAGNOSIS — Z77098 Contact with and (suspected) exposure to other hazardous, chiefly nonmedicinal, chemicals: Secondary | ICD-10-CM

## 2015-12-19 DIAGNOSIS — T59891A Toxic effect of other specified gases, fumes and vapors, accidental (unintentional), initial encounter: Secondary | ICD-10-CM

## 2015-12-19 DIAGNOSIS — Z7982 Long term (current) use of aspirin: Secondary | ICD-10-CM | POA: Insufficient documentation

## 2015-12-19 DIAGNOSIS — R0602 Shortness of breath: Secondary | ICD-10-CM | POA: Diagnosis present

## 2015-12-19 DIAGNOSIS — Z87891 Personal history of nicotine dependence: Secondary | ICD-10-CM | POA: Insufficient documentation

## 2015-12-19 DIAGNOSIS — T594X4A Toxic effect of chlorine gas, undetermined, initial encounter: Secondary | ICD-10-CM | POA: Diagnosis not present

## 2015-12-19 MED ORDER — SODIUM CHLORIDE 0.9 % IN NEBU
3.0000 mL | INHALATION_SOLUTION | Freq: Once | RESPIRATORY_TRACT | Status: DC
  Administered 2015-12-19: 3 mL via RESPIRATORY_TRACT
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
## 2015-12-19 NOTE — ED Triage Notes (Signed)
Per EMS: Pt attempting to clean house today. Pt mixed bleach and ammonia, began coughing. Pt at work today, experienced a syncopal episode. Pt complaining of headache post bleach/ammonia mix. Pt a/o x 4.
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
## 2016-12-04 ENCOUNTER — Encounter (HOSPITAL_COMMUNITY): Payer: Self-pay | Admitting: Emergency Medicine

## 2016-12-04 ENCOUNTER — Emergency Department (HOSPITAL_COMMUNITY)
Admission: EM | Admit: 2016-12-04 | Discharge: 2016-12-04 | Disposition: A | Discharge status: Home / Self Care | Payer: Self-pay | Attending: Emergency Medicine | Admitting: Emergency Medicine

## 2016-12-04 ENCOUNTER — Emergency Department (HOSPITAL_COMMUNITY): Payer: Self-pay

## 2016-12-04 DIAGNOSIS — Z79899 Other long term (current) drug therapy: Secondary | ICD-10-CM | POA: Insufficient documentation

## 2016-12-04 DIAGNOSIS — Z87891 Personal history of nicotine dependence: Secondary | ICD-10-CM | POA: Insufficient documentation

## 2016-12-04 DIAGNOSIS — M79622 Pain in left upper arm: Secondary | ICD-10-CM | POA: Insufficient documentation

## 2016-12-04 NOTE — Discharge Instructions (Signed)
Alternate 600 mg of ibuprofen and [phone] mg of Tylenol every 3 hours as needed for pain. Do not exceed 4000 mg of Tylenol daily. Apply ice or heat to the extremity for comfort. Do some gentle stretching to avoid muscle stiffness. I have attached contact information for [facility] and wellness, the women's clinic, and the [facility] who may be able to help remove the Nexplanon. [facility] is located at [address], [place], [place] [address]. Follow-up with them. Return to the ED if any concerning signs or symptoms develop.

## 2016-12-04 NOTE — ED Notes (Signed)
PT STATES SHE HAS AN EXPIRED BIRTH CONTROL IN HER LUE (PAST DUE 7 MONTHS). DUE TO HER INSURANCE, HER OB-GYN WILL NOT REMOVE. PT STATES SHE HAS HAD INTERMITTENT SWELLING TO LUE. AT PRESENT NO SWELLING, REDNESS, NUMBNESS, PAIN,FEVER OR TINGLING. NEURO INTACT. PT DENIES CHEST PAIN AND SOB. PT CONCERNED FOR BLOOD CLOT OR AN EPISODE WILL POSSIBLE OCCUR. REQUESTING BIRTH CONTROL TO BE REMOVED HERE.

## 2016-12-04 NOTE — ED Triage Notes (Signed)
Patient reports left arm swelling since last night around implant of birth control.  Patient reports that it has expired and needs it removed.

## 2016-12-04 NOTE — ED Provider Notes (Signed)
[place] DEPT Provider Note   CSN: [id number] Arrival date & time: 12/04/16  1455     History   Chief Complaint Chief Complaint  Patient presents with  . Arm Swelling around implant site    HPI Kimberly Gibson is a 21 y.o. female with history of BV, GERD, adjustment disorder who presents today with chief complaint acute onset, intermittent swelling and pain of the left upper arm. Patient states that for the past 2 days she will experience swelling and throbbing pain. She will attempt experienced numbness and tingling to her fingertips. She states pain is temporary and will resolve on its own, but may return several hours later. Tylenol has been helpful. Patient states that she has the Nexplanon implant in this upper arm, and has had it for 3 years. She states "it expired 7 months ago, but my OB/GYN will not take it out because I don't have the right form of Medicaid". She states this is the same OB/GYN that implanted the nexplanon. Denies any recent travel or surgeries, denies hemoptysis, CP, SOB, fevers, chills, history of cancer, and she is a nonsmoker. No recent trauma or falls. She states that she is concerned for blood clots and states "if somebody here doesn't take it out, I'll take it out myself".    The history is provided by the patient.    Past Medical History:  Diagnosis Date  . BV (bacterial vaginosis)   . GERD (gastroesophageal reflux disease)   . Normal pregnancy 09/17/2013    Patient Active Problem List   Diagnosis Date Noted  . Adjustment disorder with mixed anxiety and depressed mood 08/28/2015  . GERD (gastroesophageal reflux disease) 08/27/2015  . Overdose of iron or iron compound 08/27/2015  . Microcytic anemia 08/27/2015  . Normal pregnancy, first 09/18/2013  . SVD (spontaneous vaginal delivery) 09/18/2013  . Normal pregnancy 09/17/2013    Past Surgical History:  Procedure Laterality Date  . NO PAST SURGERIES    . WISDOM TOOTH EXTRACTION      OB  History    Gravida Para Term Preterm AB Living   1 1 1  0 0 1   SAB TAB Ectopic Multiple Live Births   0 0 0 0 1       Home Medications    Prior to Admission medications   Medication Sig Start Date End Date Taking? Authorizing Provider  acetaminophen (TYLENOL) 325 MG tablet Take 650 mg by mouth every 6 (six) hours as needed for moderate pain.   Yes [provider]  etonogestrel (NEXPLANON) 68 MG IMPL implant 1 each by Subdermal route once. Placed 08/2013   Yes [provider]  FLUoxetine (PROZAC) 10 MG capsule Take 1 capsule (10 mg total) by mouth daily. Patient not taking: Reported on 12/04/2015 08/28/15   Annita Brod, MD  HYDROcodone-acetaminophen (NORCO/VICODIN) 5-325 MG tablet Take 1-2 tablets by mouth every 4 (four) hours as needed. Patient not taking: Reported on 12/04/2016 12/04/15   Frederica Kuster, PA-C    Family History Family History  Problem Relation Age of Onset  . Asthma Sister   . Hearing loss Neg Hx     Social History Social History  Substance Use Topics  . Smoking status: Former Research scientist (life sciences)  . Smokeless tobacco: Never Used     Comment: quit 2011  . Alcohol use No     Allergies   Patient has no known allergies.   Review of Systems Review of Systems  Constitutional: Negative for chills and fever.  Respiratory: Negative for shortness of breath.   Cardiovascular: Negative for chest pain.  Gastrointestinal: Negative for abdominal pain, nausea and vomiting.  Musculoskeletal: Positive for myalgias (L upper arm).  Neurological: Positive for numbness. Negative for syncope, weakness, light-headedness and headaches.  All other systems reviewed and are negative.    Physical Exam Updated Vital Signs BP 121/74 (BP Location: Right Arm)   Pulse 67   Temp 98.8 F (37.1 C) (Oral)   Resp 16   SpO2 100%   Physical Exam  Constitutional: She appears well-developed and well-nourished. No distress.  HENT:  Head: Normocephalic and atraumatic.    Eyes: Conjunctivae are normal. Right eye exhibits no discharge. Left eye exhibits no discharge.  Neck: Normal range of motion. Neck supple. No JVD present. No tracheal deviation present.  Cardiovascular: Normal rate and intact distal pulses.   2+ radial and DP/PT pulses bl, negative Homan's bl. No erythema, swelling, or palpable cords to the extremities   Pulmonary/Chest: Effort normal.  Abdominal: Soft. She exhibits no distension. There is no tenderness.  Musculoskeletal: Normal range of motion. She exhibits no edema or tenderness.  No tenderness on palpation of the bilateral upper extremities. No swelling, crepitus, erythema, warmth, or deformity noted. 5/5 strength of BUE major muscle groups. Normal range of motion of the extremities with no pain.  Neurological: She is alert. No sensory deficit.  Fluent speech, no facial droop, sensation intact to soft touch of bilateral upper extremities, good grip strength  Skin: Skin is warm and dry. Capillary refill takes less than 2 seconds. No erythema.  Psychiatric: She has a normal mood and affect. Her behavior is normal.  Nursing note and vitals reviewed.    ED Treatments / Results  Labs (all labs ordered are listed, but only abnormal results are displayed) Labs Reviewed - No data to display  EKG  EKG Interpretation None       Radiology No results found.  Procedures Procedures (including critical care time)  Medications Ordered in ED Medications - No data to display   Initial Impression / Assessment and Plan / ED Course  I have reviewed the triage vital signs and the nursing notes.  Pertinent labs & imaging results that were available during my care of the patient were reviewed by me and considered in my medical decision making (see chart for details).     Patient with subjective left upper arm pain and swelling for 2 days intermittently. She is requesting that her Nexplanon (which has been implanted for only 3 years) be  removed. Afebrile, vital signs are stable. She is neurovascularly intact. She has no observable swelling on my examination. No evidence of cellulitis, abscess, and I have a low suspicion of DVT. Compartment are soft, and she has no history of trauma or falls. I explained to patient that we typically do not remove Nexplanon in the ED, and typically a certification is required to both implant and remove the device. I provided the patient with numerous resources in the area that will take her Medicaid insurance or will likely remove the device and a low cost. Discussed indications for return to the ED. Pt was initially somewhat upset that her nexplanon would not be removed today, but verbalized understanding of and agreement with plan and became more cooperative. She is safe for discharge home at this time.   Final Clinical Impressions(s) / ED Diagnoses   Final diagnoses:  Left upper arm pain    New Prescriptions Discharge Medication List as of 12/04/2016  8:03 PM       Renita Papa, PA-C 12/05/16 0032    Tegeler, Gwenyth Allegra, MD 12/05/16 [phone]

## 2016-12-19 ENCOUNTER — Ambulatory Visit: Payer: Self-pay | Admitting: Certified Nurse Midwife

## 2016-12-21 ENCOUNTER — Inpatient Hospital Stay (HOSPITAL_COMMUNITY)
Admission: AD | Admit: 2016-12-21 | Discharge: 2016-12-21 | Disposition: A | Discharge status: Home / Self Care | Payer: Medicaid Other | Source: Ambulatory Visit | Attending: Obstetrics & Gynecology | Admitting: Obstetrics & Gynecology

## 2016-12-21 DIAGNOSIS — Z3202 Encounter for pregnancy test, result negative: Secondary | ICD-10-CM

## 2016-12-21 DIAGNOSIS — K219 Gastro-esophageal reflux disease without esophagitis: Secondary | ICD-10-CM | POA: Insufficient documentation

## 2016-12-21 DIAGNOSIS — Z87891 Personal history of nicotine dependence: Secondary | ICD-10-CM | POA: Insufficient documentation

## 2016-12-21 DIAGNOSIS — Z79899 Other long term (current) drug therapy: Secondary | ICD-10-CM | POA: Insufficient documentation

## 2016-12-21 DIAGNOSIS — R109 Unspecified abdominal pain: Secondary | ICD-10-CM | POA: Insufficient documentation

## 2016-12-21 LAB — URINALYSIS, ROUTINE W REFLEX MICROSCOPIC
Bilirubin Urine: NEGATIVE
Glucose, UA: NEGATIVE mg/dL
Hgb urine dipstick: NEGATIVE
KETONES UR: NEGATIVE mg/dL
Leukocytes, UA: NEGATIVE
Nitrite: NEGATIVE
PROTEIN: 30 mg/dL — AB
RBC / HPF: NONE SEEN RBC/hpf (ref 0–5)
Specific Gravity, Urine: 1.028 (ref 1.005–1.030)
pH: 6 (ref 5.0–8.0)

## 2016-12-21 LAB — POCT PREGNANCY, URINE: PREG TEST UR: NEGATIVE

## 2016-12-21 LAB — CBC
HCT: 28.4 % — ABNORMAL LOW (ref 36.0–46.0)
Hemoglobin: 8.3 g/dL — ABNORMAL LOW (ref 12.0–15.0)
MCH: 18.8 pg — ABNORMAL LOW (ref 26.0–34.0)
MCHC: 29.2 g/dL — ABNORMAL LOW (ref 30.0–36.0)
MCV: 64.3 fL — AB (ref 78.0–100.0)
PLATELETS: 463 10*3/uL — AB (ref 150–400)
RBC: 4.42 MIL/uL (ref 3.87–5.11)
RDW: 19.7 % — AB (ref 11.5–15.5)
WBC: 6.8 10*3/uL (ref 4.0–10.5)

## 2016-12-21 LAB — HCG, QUANTITATIVE, PREGNANCY: HCG, BETA CHAIN, QUANT, S: 5 m[IU]/mL — AB (ref <5)

## 2016-12-21 LAB — ABO/RH: ABO/RH(D): A POS

## 2016-12-21 NOTE — MAU Provider Note (Signed)
History   Patient Kimberly Gibson is a 21 y.o. G1P1001 At unknown ga here because she had a positive pregnancy test at home.   She thinks she may be at least [redacted] weeks pregnant. She had her Nexplanon removed in mid-October by [facility], but it had been expired for over a year.   She also missed her menses this month. She is here to see if she is pregnant; she also has some cramping that happens at different points in her abdomen about 3 times per day. She rates it a 2/10. She says she can deal with it. She denies vaginal bleeding or irregular discharge.   CSN: [id number]  Arrival date and time: 12/21/16 1254   None     Chief Complaint  Patient presents with  . Possible Pregnancy  . Abdominal Cramping   Possible Pregnancy  Associated symptoms include abdominal pain. Pertinent negatives include no nausea or vomiting.  Abdominal Cramping  This is a new problem. The current episode started in the past 7 days. The onset quality is sudden. The problem occurs 2 to 4 times per day. The pain is located in the generalized abdominal region. The pain is at a severity of 2/10. The quality of the pain is cramping. The abdominal pain does not radiate. Pertinent negatives include no constipation, diarrhea, nausea or vomiting. Nothing aggravates the pain. The pain is relieved by nothing. She has tried acetaminophen for the symptoms.    OB History    Gravida Para Term Preterm AB Living   1 1 1  0 0 1   SAB TAB Ectopic Multiple Live Births   0 0 0 0 1      Past Medical History:  Diagnosis Date  . BV (bacterial vaginosis)   . GERD (gastroesophageal reflux disease)   . Normal pregnancy 09/17/2013    Past Surgical History:  Procedure Laterality Date  . NO PAST SURGERIES    . WISDOM TOOTH EXTRACTION      Family History  Problem Relation Age of Onset  . Asthma Sister   . Hearing loss Neg Hx     Social History  Substance Use Topics  . Smoking status: Former Research scientist (life sciences)  . Smokeless  tobacco: Never Used     Comment: quit 2011  . Alcohol use No    Allergies: No Known Allergies  Prescriptions Prior to Admission  Medication Sig Dispense Refill Last Dose  . acetaminophen (TYLENOL) 325 MG tablet Take 650 mg by mouth every 6 (six) hours as needed for moderate pain.   12/04/2016 at Unknown time  . etonogestrel (NEXPLANON) 68 MG IMPL implant 1 each by Subdermal route once. Placed 08/2013   12/04/2016 at ongoing  . FLUoxetine (PROZAC) 10 MG capsule Take 1 capsule (10 mg total) by mouth daily. (Patient not taking: Reported on 12/04/2015) 30 capsule 3 Not Taking at Unknown time  . HYDROcodone-acetaminophen (NORCO/VICODIN) 5-325 MG tablet Take 1-2 tablets by mouth every 4 (four) hours as needed. (Patient not taking: Reported on 12/04/2016) 8 tablet 0 Completed Course at Unknown time    Review of Systems  HENT: Negative.   Respiratory: Negative.   Gastrointestinal: Positive for abdominal pain. Negative for constipation, diarrhea, nausea and vomiting.  Genitourinary: Negative.   Musculoskeletal: Negative.    Physical Exam   Blood pressure 118/73, pulse 97, temperature 98.3 F (36.8 C), temperature source Oral, resp. rate 17, height 5\' 5"  (1.651 m), weight 140 lb (63.5 kg), unknown if currently breastfeeding.  Physical Exam  Constitutional:  She is oriented to person, place, and time. She appears well-developed and well-nourished.  HENT:  Head: Normocephalic.  Neck: Normal range of motion.  GI: Soft.  Musculoskeletal: Normal range of motion.  Neurological: She is alert and oriented to person, place, and time.  Skin: Skin is warm and dry.  Psychiatric: She has a normal mood and affect.    MAU Course  Procedures  MDM -ABO, CBC and beta HCG -Bhcg is 5; may represent physiological activity of the pituitary gland.  -urine pregnancy test here was negative.  Case discussed with Dr. Glo Herring, who recommends patient have follow-up bHCG in clinic in 7 days to follow trend.    Assessment and Plan   1. Pregnancy examination or test, negative result    2. Patient stable for discharge with plan to come [facility] for repeat beta in 1 week. Patient knows to come at 11 am and does not need to wait for her results. At that point, a decision can be made to consider [place]. 3. Patient verbalized understanding; all questions answered.    Mervyn Skeeters Kooistra CNM 12/21/2016, 2:09 PM

## 2016-12-21 NOTE — Discharge Instructions (Signed)
Pregnancy Test Information °What is a pregnancy test? °A pregnancy test is used to detect the presence of human chorionic gonadotropin (hCG) in a sample of your urine or blood. hCG is a hormone produced by the cells of the placenta. The placenta is the organ that forms to nourish and support a developing baby. °This test requires a sample of either blood or urine. A pregnancy test determines whether you are pregnant or not. °How are pregnancy tests done? °Pregnancy tests are done using a home pregnancy test or having a blood or urine test done at your health care provider's office. °Home pregnancy tests require a urine sample. °· Most kits use a plastic testing device with a strip of paper that indicates whether there is hCG in your urine. °· Follow the test instructions very carefully. °· After you urinate on the test stick, markings will appear to let you know whether you are pregnant. °· For best results, use your first urine of the morning. That is when the concentration of hCG is highest. ° °Having a blood test to check for pregnancy requires a sample of blood drawn from a vein in your hand or arm. Your health care provider will send your sample to a lab for testing. Results of a pregnancy test will be positive or negative. °Is one type of pregnancy test better than another? °In some cases, a blood test will return a positive result even if a urine test was negative because blood tests are more sensitive. This means blood tests can detect hCG earlier than home pregnancy tests. °How accurate are home pregnancy tests? °Both types of pregnancy tests are very accurate. °· A blood test is about 98% accurate. °· When you are far enough along in your pregnancy and when used correctly, home pregnancy tests are equally accurate. ° °Can anything interfere with home pregnancy test results °It is possible for certain conditions to cause an inaccurate test result (false positive or false negative). °· A false positive is a  positive test result when you are not pregnant. This can happen if you: °? Are taking certain medicines, including anticonvulsants or tranquilizers. °? Have certain proteins in your blood. °· A false negative is a negative test result when you are pregnant. This can happen if you: °? Took the test before there was enough hCG to detect. A pregnancy test will not be positive in most women until 3-4 weeks after conception. °? Drank a lot of liquid before the test. Diluted urine samples can sometimes give an inaccurate result. °? Take certain medicines, such as water pills (diuretics) or some antihistamines. ° °What should I do if I have a positive pregnancy test? °If you have a positive pregnancy test, schedule an appointment with your health care provider. You might need additional testing to confirm the pregnancy. In the meantime, begin taking a prenatal vitamin, stop smoking, stop drinking alcohol, and do not use street drugs. °Talk to your health care provider about how to take care of yourself during your pregnancy. Ask about what to expect from the care you will need throughout pregnancy (prenatal care). °This information is not intended to replace advice given to you by your health care provider. Make sure you discuss any questions you have with your health care provider. °Document Released: 02/16/2003 Document Revised: 01/11/2016 Document Reviewed: 06/10/2013 °Elsevier Interactive Patient Education © 2017 Elsevier Inc. ° °

## 2016-12-27 ENCOUNTER — Ambulatory Visit: Payer: Medicaid Other

## 2016-12-27 DIAGNOSIS — Z3200 Encounter for pregnancy test, result unknown: Secondary | ICD-10-CM

## 2016-12-27 NOTE — Progress Notes (Signed)
Per Lysbeth Penner, CNM from MAU visit on 12/21/16 pt is here just for a rpt beta not a STAT beta.  Pt informed that she will be contacted with results.

## 2016-12-28 ENCOUNTER — Inpatient Hospital Stay (HOSPITAL_COMMUNITY)
Admission: AD | Admit: 2016-12-28 | Discharge: 2016-12-28 | Disposition: A | Discharge status: Home / Self Care | Payer: Medicaid Other | Source: Ambulatory Visit | Attending: Obstetrics and Gynecology | Admitting: Obstetrics and Gynecology

## 2016-12-28 DIAGNOSIS — Z3202 Encounter for pregnancy test, result negative: Secondary | ICD-10-CM | POA: Insufficient documentation

## 2016-12-28 DIAGNOSIS — R1084 Generalized abdominal pain: Secondary | ICD-10-CM | POA: Insufficient documentation

## 2016-12-28 LAB — BETA HCG QUANT (REF LAB)

## 2016-12-28 NOTE — MAU Note (Signed)
Pt presents stating she wants to have a blood test for pregnancy hormone. Lower abdominal cramping

## 2016-12-28 NOTE — Progress Notes (Signed)
   S: Kimberly Gibson is a 21 y.o. G63P1001 non pregnant female who presents requesting the results of her blood test yesterday. She states she is concerned she might be pregnant and no one called her with results. She is having lower abdominal cramping that she rates a 5/10 and has not tried anything for. She denies any vaginal bleeding or discharge. LMP 11/18/16.  O:  12/27/16: hCG Quant mIU/mL <1    Vitals:   12/28/16 1304  BP: 121/60  Pulse: (!) 101   Physical Examination: General appearance - alert, well appearing, and in no distress and oriented to person, place, and time Mental status - alert, oriented to person, place, and time, normal mood, behavior, speech, dress, motor activity, and thought processes, anxious Abdomen - soft, nontender, nondistended, no masses or organomegaly Neurological -screening mental status exam normal  Patient declined further assessment for abdominal pain  A:  1. Pregnancy examination or test, negative result   2. Generalized abdominal pain    P: -Discharge home in stable condition -Encouraged tylenol and ibuprofen for pain relief -Patient advised to follow-up with [facility] as needed for [facility] care -Patient may return to MAU as needed or if her condition were to change or worsen

## 2016-12-28 NOTE — Discharge Instructions (Signed)

## 2017-01-03 ENCOUNTER — Inpatient Hospital Stay (HOSPITAL_COMMUNITY)
Admission: AD | Admit: 2017-01-03 | Discharge: 2017-01-03 | Disposition: A | Discharge status: Home / Self Care | Payer: Medicaid Other | Source: Ambulatory Visit | Attending: Obstetrics & Gynecology | Admitting: Obstetrics & Gynecology

## 2017-01-03 ENCOUNTER — Encounter (HOSPITAL_COMMUNITY): Payer: Self-pay | Admitting: *Deleted

## 2017-01-03 ENCOUNTER — Encounter (HOSPITAL_COMMUNITY): Payer: Self-pay | Admitting: Emergency Medicine

## 2017-01-03 ENCOUNTER — Emergency Department (HOSPITAL_COMMUNITY)
Admission: EM | Admit: 2017-01-03 | Discharge: 2017-01-03 | Disposition: A | Discharge status: Home / Self Care | Payer: Self-pay | Attending: Emergency Medicine | Admitting: Emergency Medicine

## 2017-01-03 DIAGNOSIS — Z3202 Encounter for pregnancy test, result negative: Secondary | ICD-10-CM | POA: Insufficient documentation

## 2017-01-03 DIAGNOSIS — F4323 Adjustment disorder with mixed anxiety and depressed mood: Secondary | ICD-10-CM | POA: Insufficient documentation

## 2017-01-03 DIAGNOSIS — R1030 Lower abdominal pain, unspecified: Secondary | ICD-10-CM | POA: Insufficient documentation

## 2017-01-03 DIAGNOSIS — R110 Nausea: Secondary | ICD-10-CM

## 2017-01-03 DIAGNOSIS — Z87891 Personal history of nicotine dependence: Secondary | ICD-10-CM | POA: Insufficient documentation

## 2017-01-03 DIAGNOSIS — D75839 Thrombocytosis, unspecified: Secondary | ICD-10-CM

## 2017-01-03 DIAGNOSIS — R112 Nausea with vomiting, unspecified: Secondary | ICD-10-CM | POA: Insufficient documentation

## 2017-01-03 DIAGNOSIS — E876 Hypokalemia: Secondary | ICD-10-CM | POA: Insufficient documentation

## 2017-01-03 DIAGNOSIS — D649 Anemia, unspecified: Secondary | ICD-10-CM | POA: Insufficient documentation

## 2017-01-03 DIAGNOSIS — D473 Essential (hemorrhagic) thrombocythemia: Secondary | ICD-10-CM | POA: Insufficient documentation

## 2017-01-03 LAB — URINALYSIS, ROUTINE W REFLEX MICROSCOPIC
BACTERIA UA: NONE SEEN
BILIRUBIN URINE: NEGATIVE
Bilirubin Urine: NEGATIVE
GLUCOSE, UA: NEGATIVE mg/dL
Glucose, UA: NEGATIVE mg/dL
HGB URINE DIPSTICK: NEGATIVE
HGB URINE DIPSTICK: NEGATIVE
KETONES UR: NEGATIVE mg/dL
Ketones, ur: NEGATIVE mg/dL
LEUKOCYTES UA: NEGATIVE
Leukocytes, UA: NEGATIVE
NITRITE: NEGATIVE
Nitrite: NEGATIVE
PH: 7 (ref 5.0–8.0)
PROTEIN: 30 mg/dL — AB
Protein, ur: NEGATIVE mg/dL
SPECIFIC GRAVITY, URINE: 1.026 (ref 1.005–1.030)
Specific Gravity, Urine: 1.029 (ref 1.005–1.030)
pH: 5 (ref 5.0–8.0)

## 2017-01-03 LAB — COMPREHENSIVE METABOLIC PANEL
ALT: 9 U/L — AB (ref 14–54)
AST: 22 U/L (ref 15–41)
Albumin: 3.8 g/dL (ref 3.5–5.0)
Alkaline Phosphatase: 59 U/L (ref 38–126)
Anion gap: 9 (ref 5–15)
BUN: 9 mg/dL (ref 6–20)
CHLORIDE: 103 mmol/L (ref 101–111)
CO2: 26 mmol/L (ref 22–32)
CREATININE: 0.51 mg/dL (ref 0.44–1.00)
Calcium: 9.3 mg/dL (ref 8.9–10.3)
GFR calc non Af Amer: >60 mL/min (ref >60)
Glucose, Bld: 101 mg/dL — ABNORMAL HIGH (ref 65–99)
Potassium: 3.1 mmol/L — ABNORMAL LOW (ref 3.5–5.1)
SODIUM: 138 mmol/L (ref 135–145)
Total Bilirubin: 0.4 mg/dL (ref 0.3–1.2)
Total Protein: 7.9 g/dL (ref 6.5–8.1)

## 2017-01-03 LAB — CBC
HCT: 30 % — ABNORMAL LOW (ref 36.0–46.0)
Hemoglobin: 8.7 g/dL — ABNORMAL LOW (ref 12.0–15.0)
MCH: 19.8 pg — AB (ref 26.0–34.0)
MCHC: 29 g/dL — ABNORMAL LOW (ref 30.0–36.0)
MCV: 68.2 fL — AB (ref 78.0–100.0)
PLATELETS: 561 10*3/uL — AB (ref 150–400)
RBC: 4.4 MIL/uL (ref 3.87–5.11)
RDW: 21.3 % — ABNORMAL HIGH (ref 11.5–15.5)
WBC: 8.2 10*3/uL (ref 4.0–10.5)

## 2017-01-03 LAB — HCG, QUANTITATIVE, PREGNANCY: hCG, Beta Chain, Quant, S: <1 m[IU]/mL (ref <5)

## 2017-01-03 LAB — WET PREP, GENITAL
Clue Cells Wet Prep HPF POC: NONE SEEN
Sperm: NONE SEEN
Trich, Wet Prep: NONE SEEN
Yeast Wet Prep HPF POC: NONE SEEN

## 2017-01-03 LAB — LIPASE, BLOOD: LIPASE: 23 U/L (ref 11–51)

## 2017-01-03 LAB — POCT PREGNANCY, URINE: Preg Test, Ur: NEGATIVE

## 2017-01-03 MED ORDER — POTASSIUM CHLORIDE CRYS ER 20 MEQ PO TBCR
60.0000 meq | EXTENDED_RELEASE_TABLET | Freq: Once | ORAL | Status: AC
  Administered 2017-01-03: 60 meq via ORAL
  Filled 2017-01-03: qty 3

## 2017-01-03 NOTE — MAU Note (Signed)
Patient states she was seen 12/27/16 and her HCG was <1.  Took hpt 2 days ago and it was positive.  Now having abdominal pain.  Unsure of LMP, thinks it was in September.

## 2017-01-03 NOTE — Discharge Instructions (Signed)
Abdominal Pain, Adult Abdominal pain can be caused by many things. Often, abdominal pain is not serious and it gets better with no treatment or by being treated at home. However, sometimes abdominal pain is serious. Your health care provider will do a medical history and a physical exam to try to determine the cause of your abdominal pain. Follow these instructions at home:  Take over-the-counter and prescription medicines only as told by your health care provider. Do not take a laxative unless told by your health care provider.  Drink enough fluid to keep your urine clear or pale yellow.  Watch your condition for any changes.  Keep all follow-up visits as told by your health care provider. This is important. Contact a health care provider if:  Your abdominal pain changes or gets worse.  You are not hungry or you lose weight without trying.  You are constipated or have diarrhea for more than 2-3 days.  You have pain when you urinate or have a bowel movement.  Your abdominal pain wakes you up at night.  Your pain gets worse with meals, after eating, or with certain foods.  You are throwing up and cannot keep anything down.  You have a fever. Get help right away if:  Your pain does not go away as soon as your health care provider told you to expect.  You cannot stop throwing up.  Your pain is only in areas of the abdomen, such as the right side or the left lower portion of the abdomen.  You have bloody or black stools, or stools that look like tar.  You have severe pain, cramping, or bloating in your abdomen.  You have signs of dehydration, such as: ? Dark urine, very little urine, or no urine. ? Cracked lips. ? Dry mouth. ? Sunken eyes. ? Sleepiness. ? Weakness. This information is not intended to replace advice given to you by your health care provider. Make sure you discuss any questions you have with your health care provider. Document Released: 11/23/2004 Document  Revised: 09/03/2015 Document Reviewed: 07/28/2015 Elsevier Interactive Patient Education  2017 [place] [facility] [employer] at [facility]       Phone: [phone]  [facility] [employer] at [place] Phone: [place] [facility] [employer] at [place]  Phone: [place] for [place] at [facility]  Phone: [place] [facility] [place] at [place]  Phone: [facility]       Phone: [phone]  [facility]    Phone: [phone]   [facility] [place])    Phone: [place] [facility]    Phone: [phone]  [facility]    Phone: [place]    Phone: [phone]  [place] [facility]       Phone: [phone]   [place] [facility]  Phone: [place]    Phone: [phone]  [facility]   Phone: [phone]  [facility]      Phone: [phone]  [facility]    Phone: [phone]

## 2017-01-03 NOTE — ED Provider Notes (Signed)
[facility] DEPT Provider Note   CSN: [id number] Arrival date & time: 01/03/17  1140     History   Chief Complaint Chief Complaint  Patient presents with  . Abdominal Pain  . Nausea    HPI Kimberly Gibson is a 21 y.o. G50P1001 female with a PMHx of GERD and anemia, who presents to the ED with complaints of 1 week of intermittent lower abdominal pain.  She describes her pain as 8/10 intermittent stabbing nonradiating lower abdominal pain that worsens with twisting or walking, and has been unrelieved with Tylenol.  She has had associated nausea.  She also vomited 2 times this past week, last time was yesterday, both episodes were nonbloody and nonbilious, she has not vomited today.  LMP was approximately 11/18/16 although she is not exactly sure, states that it usually comes around the last part of the month however she cannot remember exactly when it was.  She is sexually active with one female partner, unprotected. Chart review reveals that she was seen at the [facility] on 12/21/16 where her BetaHCG was 5, Upreg neg, advised to f/up in 1wk for repeat HCG. She was seen again on 12/28/16 requesting the results of her 12/27/16 betaHCG test, which was <1, so she was advised to f/up with a regular OBGYN for ongoing medical care. She was seen again today in the [facility], where she had a neg Upreg, betaHCG <1, U/A contaminated but no definite evidence of UTI, wet prep unremarkable, and pelvic exam was essentially unremarkable except for scant creamy discharge reported by the provider; she was advised to use ibuprofen for her pain and f/up with an outpatient provider. She feels like she isn't getting any answers with the [facility] providers because she still thinks she's pregnant because her UPT at home are positive, and with her son she had negative tests but was pregnant (chart review reveals that in 12/2012 she had +UPT at [facility], which was when she was pregnant with her son).   She denies fevers,  chills, CP, SOB, diarrhea/constipation, obstipation, melena, hematochezia, hematemesis, hematuria, dysuria, vaginal bleeding/discharge, myalgias, arthralgias, numbness, tingling, focal weakness, or any other complaints at this time. Denies recent travel, sick contacts, suspicious food intake, EtOH use, or frequent NSAID use.    The history is provided by the patient and medical records. No language interpreter was used.  Abdominal Pain   This is a new problem. The current episode started more than 2 days ago. The problem occurs daily. The problem has not changed since onset.The pain is associated with an unknown factor. The pain is located in the suprapubic region, RLQ and LLQ. Quality: stabbing. The pain is at a severity of 8/10. The pain is moderate. Associated symptoms include nausea and vomiting (2x this week, none since yesterday). Pertinent negatives include fever, diarrhea, flatus, hematochezia, melena, constipation, dysuria, hematuria, arthralgias and myalgias. The symptoms are aggravated by activity. Nothing relieves the symptoms.    Past Medical History:  Diagnosis Date  . BV (bacterial vaginosis)   . GERD (gastroesophageal reflux disease)   . Normal pregnancy 09/17/2013    Patient Active Problem List   Diagnosis Date Noted  . Adjustment disorder with mixed anxiety and depressed mood 08/28/2015  . GERD (gastroesophageal reflux disease) 08/27/2015  . Overdose of iron or iron compound 08/27/2015  . Microcytic anemia 08/27/2015  . Normal pregnancy, first 09/18/2013  . SVD (spontaneous vaginal delivery) 09/18/2013  . Normal pregnancy 09/17/2013    Past Surgical History:  Procedure  Laterality Date  . NO PAST SURGERIES    . WISDOM TOOTH EXTRACTION    . WISDOM TOOTH EXTRACTION      OB History    Gravida Para Term Preterm AB Living   2 1 1  0 1 1   SAB TAB Ectopic Multiple Live Births   1 0 0 0 1       Home Medications    Prior to Admission medications   Medication Sig  Start Date End Date Taking? Authorizing Provider  acetaminophen (TYLENOL) 325 MG tablet Take 325 mg every 6 (six) hours as needed by mouth (abdominal pain).   Yes [provider]    Family History Family History  Problem Relation Age of Onset  . Asthma Sister   . Hearing loss Neg Hx     Social History Social History   Tobacco Use  . Smoking status: Former Research scientist (life sciences)  . Smokeless tobacco: Never Used  . Tobacco comment: quit 2011  Substance Use Topics  . Alcohol use: No  . Drug use: No     Allergies   Patient has no known allergies.   Review of Systems Review of Systems  Constitutional: Negative for chills and fever.  Respiratory: Negative for shortness of breath.   Cardiovascular: Negative for chest pain.  Gastrointestinal: Positive for abdominal pain, nausea and vomiting (2x this week, none since yesterday). Negative for blood in stool, constipation, diarrhea, flatus, hematochezia and melena.  Genitourinary: Negative for dysuria, hematuria, vaginal bleeding and vaginal discharge.  Musculoskeletal: Negative for arthralgias and myalgias.  Skin: Negative for color change.  Allergic/Immunologic: Negative for immunocompromised state.  Neurological: Negative for weakness and numbness.  Psychiatric/Behavioral: Negative for confusion.   All other systems reviewed and are negative for acute change except as noted in the HPI.    Physical Exam Updated Vital Signs BP 123/66 (BP Location: Left Arm)   Pulse 95   Temp 98.2 F (36.8 C) (Oral)   Resp 17   Ht 5\' 5"  (1.651 m)   Wt 63.5 kg (140 lb)   SpO2 98%   BMI 23.30 kg/m   Physical Exam  Constitutional: She is oriented to person, place, and time. Vital signs are normal. She appears well-developed and well-nourished.  Non-toxic appearance. No distress.  Afebrile, nontoxic, NAD  HENT:  Head: Normocephalic and atraumatic.  Mouth/Throat: Oropharynx is clear and moist and mucous membranes are normal.  Eyes: Conjunctivae  and EOM are normal. Right eye exhibits no discharge. Left eye exhibits no discharge.  Neck: Normal range of motion. Neck supple.  Cardiovascular: Normal rate, regular rhythm, normal heart sounds and intact distal pulses. Exam reveals no gallop and no friction rub.  No murmur heard. Pulmonary/Chest: Effort normal and breath sounds normal. No respiratory distress. She has no decreased breath sounds. She has no wheezes. She has no rhonchi. She has no rales.  Abdominal: Soft. Normal appearance and bowel sounds are normal. She exhibits no distension. There is tenderness in the right lower quadrant, suprapubic area and left lower quadrant. There is no rigidity, no rebound, no guarding, no CVA tenderness, no tenderness at McBurney's point and negative Murphy's sign.  Soft, nondistended, +BS throughout, with mild lower abd TTP reported but seems to be distractable, and has no tenderness during bedside ultrasound; no r/g/r, neg murphy's, neg mcburney's, no CVA TTP   Musculoskeletal: Normal range of motion.  Neurological: She is alert and oriented to person, place, and time. She has normal strength. No sensory deficit.  Skin: Skin is  warm, dry and intact. No rash noted.  Psychiatric: She has a normal mood and affect.  Nursing note and vitals reviewed.    ED Treatments / Results  Labs (all labs ordered are listed, but only abnormal results are displayed) Labs Reviewed  COMPREHENSIVE METABOLIC PANEL - Abnormal; Notable for the following components:      Result Value   Potassium 3.1 (*)    Glucose, Bld 101 (*)    ALT 9 (*)    All other components within normal limits  CBC - Abnormal; Notable for the following components:   Hemoglobin 8.7 (*)    HCT 30.0 (*)    MCV 68.2 (*)    MCH 19.8 (*)    MCHC 29.0 (*)    RDW 21.3 (*)    Platelets 561 (*)    All other components within normal limits  LIPASE, BLOOD  URINALYSIS, ROUTINE W REFLEX MICROSCOPIC  HCG, QUANTITATIVE, PREGNANCY   Wet prep 01/03/17:     Ref. Range 01/03/2017 08:54  Yeast Wet Prep HPF POC Latest Ref Range: NONE SEEN  NONE SEEN  Trich, Wet Prep Latest Ref Range: NONE SEEN  NONE SEEN  Clue Cells Wet Prep HPF POC Latest Ref Range: NONE SEEN  NONE SEEN  WBC, Wet Prep HPF POC Latest Ref Range: NONE SEEN  FEW (A)    EKG  EKG Interpretation None       Radiology No results found.  Procedures Procedures (including critical care time)   EMERGENCY DEPARTMENT [place] PREGNANCY "Study: Limited Ultrasound of the Pelvis for Pregnancy"  INDICATIONS:Pregnancy(required) and Abdominal or pelvic pain Multiple views of the uterus and pelvic cavity were obtained in real-time with a multi-frequency probe.  APPROACH:Transabdominal  PERFORMED BY: Myself IMAGES ARCHIVED?: No LIMITATIONS: Decompressed bladder PREGNANCY FREE FLUID: None ADNEXAL FINDINGS: no gross masses/large cysts seen on ovaries bilaterally INTERPRETATION: no pregnancy seen     Medications Ordered in ED Medications  potassium chloride SA (K-DUR,KLOR-CON) CR tablet 60 mEq (60 mEq Oral Given 01/03/17 1442)     Initial Impression / Assessment and Plan / ED Course  I have reviewed the triage vital signs and the nursing notes.  Pertinent labs & imaging results that were available during my care of the patient were reviewed by me and considered in my medical decision making (see chart for details).     21 y.o. female here with 1 wk of intermittent lower abd pain and nausea, 2 episodes of emesis in the whole week (last time yesterday, none today). Went to [facility]U 3 times including today, once had betaHCG of 5 so she was told to go back a week later for recheck and at that time it was <1 (negative). Went today and had another neg BetaHCG test, as well as U/A which was unremarkable, and wet prep which was unremarkable. She doesn't feel like she's getting answers, convinced she's pregnant because with her prior pregnancy she had negative preg tests but was pregnant. Looking  back, when she was pregnant with her son in 2014, she had a +UPreg test, despite the fact that her recollection was that she never had a positive test with him. On exam, very mild lower abd TTP, nonperitoneal, and seems to not have any appreciable tenderness once distracted. Work up today is fairly reassuring, U/A WNL, betaHCG <1, lipase WNL, CMP with marginally low K 3.1 so will replete here, and CBC with chronic stable anemia and thrombocytosis.  After long discussion regarding her abd pain and the multiple neg  preg tests, she still wasn't convinced after going through all of this, that she wasn't pregnant, so I did a bedside ultrasound and showed her the uterus without a visible gestational sac inside of it, which made her feel better. Doubt need for further emergent work up at this time, advised that she could be ovulating and that's causing her intermittent abd pain, vs musculoskeletal pain since it worsens with twisting movements. Advised tylenol/motrin for pain, warm compresses, staying hydrated, and f/up with PCP in 1-2wks. I explained the diagnosis and have given explicit precautions to return to the ER including for any other new or worsening symptoms. The patient understands and accepts the medical plan as it's been dictated and I have answered their questions. Discharge instructions concerning home care and prescriptions have been given. The patient is STABLE and is discharged to home in good condition.    Final Clinical Impressions(s) / ED Diagnoses   Final diagnoses:  Lower abdominal pain  Nausea  Hypokalemia  Chronic anemia  Thrombocytosis [facility])    ED Discharge Orders    [address] [place], [place] 01/03/17 [place], MD 01/09/17 1730

## 2017-01-03 NOTE — ED Notes (Signed)
PA did an ultrasound to show pt her uterus and ovaries. No pregnancy indicated.

## 2017-01-03 NOTE — ED Triage Notes (Signed)
Patient reports that she been having lower abd pains for over week and was seen at [facility] because she had positive pregnancy test. Reports that she went back there several times for abd pain and test results but told initially that she was pregnant but her levels have decreased so not emergency for them to do any other work up on her. Patient having nausea today and some vomiting yesterday

## 2017-01-03 NOTE — MAU Provider Note (Signed)
History     CSN: [id number]  Arrival date and time: 01/03/17 3235   First Provider Initiated Contact with Patient 01/03/17 [phone]      Chief Complaint  Patient presents with  . Abdominal Pain  . positive pregnancy test   HPI Kimberly Gibson is a 21 y.o. G2P1011 at Unknown gestational age who presents with lower abdominal pain and a positive pregnancy test. She states she woke up at 3am this morning with lower abdominal pain worse than she's ever had. She states she took a pregnancy test this am and it was positive. She rates her pain now a 5/10 and tried tylenol for the pain with no relief. She denies any vaginal bleeding or abnormal discharge. Patient had Nexplanon removed in October and has not had a regular period since then.   OB History    Gravida Para Term Preterm AB Living   2 1 1  0 1 1   SAB TAB Ectopic Multiple Live Births   1 0 0 0 1      Past Medical History:  Diagnosis Date  . BV (bacterial vaginosis)   . GERD (gastroesophageal reflux disease)   . Normal pregnancy 09/17/2013    Past Surgical History:  Procedure Laterality Date  . NO PAST SURGERIES    . WISDOM TOOTH EXTRACTION    . WISDOM TOOTH EXTRACTION      Family History  Problem Relation Age of Onset  . Asthma Sister   . Hearing loss Neg Hx     Social History   Tobacco Use  . Smoking status: Former Research scientist (life sciences)  . Smokeless tobacco: Never Used  . Tobacco comment: quit 2011  Substance Use Topics  . Alcohol use: No  . Drug use: No    Allergies: No Known Allergies  Medications Prior to Admission  Medication Sig Dispense Refill Last Dose  . Pediatric Multiple Vit-C-FA (PEDIATRIC MULTIVITAMIN) chewable tablet Chew 2 tablets by mouth daily.   12/20/2016 at Unknown time    Review of Systems  Constitutional: Negative.  Negative for fatigue and fever.  HENT: Negative.   Respiratory: Negative.  Negative for shortness of breath.   Cardiovascular: Negative.  Negative for chest pain.  Gastrointestinal:  Positive for abdominal pain. Negative for constipation, diarrhea, nausea and vomiting.  Genitourinary: Negative.  Negative for dysuria, vaginal bleeding and vaginal discharge.  Neurological: Negative.  Negative for dizziness and headaches.   Physical Exam   Blood pressure 131/80, pulse 99, temperature 97.9 F (36.6 C), temperature source Oral, resp. rate 16, last menstrual period 11/20/2016, SpO2 100 %, unknown if currently breastfeeding.  Physical Exam  Nursing note and vitals reviewed. Constitutional: She is oriented to person, place, and time. She appears well-developed and well-nourished. No distress.  HENT:  Head: Normocephalic.  Eyes: Pupils are equal, round, and reactive to light.  Cardiovascular: Normal rate, regular rhythm and normal heart sounds.  Respiratory: Effort normal and breath sounds normal. No respiratory distress.  GI: Soft. Bowel sounds are normal. She exhibits no distension. There is no tenderness.  Genitourinary: No vaginal discharge found.  Neurological: She is alert and oriented to person, place, and time.  Skin: Skin is warm and dry.  Psychiatric: She has a normal mood and affect. Her behavior is normal. Judgment and thought content normal.   Pelvic exam: Cervix pink, visually closed, without lesion, scant white creamy discharge, vaginal walls and external genitalia normal Bimanual exam: Cervix 0/long/high, firm, anterior, neg CMT, uterus nontender, nonenlarged, adnexa without tenderness, enlargement, or  mass  MAU Course  Procedures Results for orders placed or performed during the hospital encounter of 01/03/17 (from the past 24 hour(s))  Urinalysis, Routine w reflex microscopic     Status: Abnormal   Collection Time: 01/03/17  7:40 AM  Result Value Ref Range   Color, Urine YELLOW YELLOW   APPearance HAZY (A) CLEAR   Specific Gravity, Urine 1.029 1.005 - 1.030   pH 5.0 5.0 - 8.0   Glucose, UA NEGATIVE NEGATIVE mg/dL   Hgb urine dipstick NEGATIVE  NEGATIVE   Bilirubin Urine NEGATIVE NEGATIVE   Ketones, ur NEGATIVE NEGATIVE mg/dL   Protein, ur 30 (A) NEGATIVE mg/dL   Nitrite NEGATIVE NEGATIVE   Leukocytes, UA NEGATIVE NEGATIVE   RBC / HPF 0-5 0 - 5 RBC/hpf   WBC, UA 0-5 0 - 5 WBC/hpf   Bacteria, UA NONE SEEN NONE SEEN   Squamous Epithelial / LPF 6-30 (A) NONE SEEN   Mucus PRESENT   hCG, quantitative, pregnancy     Status: None   Collection Time: 01/03/17  8:27 AM  Result Value Ref Range   hCG, Beta Chain, Quant, S <1 <5 mIU/mL  Wet prep, genital     Status: Abnormal   Collection Time: 01/03/17  8:54 AM  Result Value Ref Range   Yeast Wet Prep HPF POC NONE SEEN NONE SEEN   Trich, Wet Prep NONE SEEN NONE SEEN   Clue Cells Wet Prep HPF POC NONE SEEN NONE SEEN   WBC, Wet Prep HPF POC FEW (A) NONE SEEN   Sperm NONE SEEN   Pregnancy, urine POC     Status: None   Collection Time: 01/03/17  9:02 AM  Result Value Ref Range   Preg Test, Ur NEGATIVE NEGATIVE     MDM UA, UPT HCG Wet prep and gc/chlamydia Patient declined pain medication while in MAU Assessment and Plan   1. Lower abdominal pain   2. Pregnancy examination or test, negative result    -Discharge home in stable condition -Encouraged patient to use ibuprofen for pain relief -Patient advised to follow-up with gyn of choice for ongoing care or PCP -Patient may return to MAU as needed or if her condition were to change or worsen  Wende Mott CNM 01/03/2017, 9:56 AM

## 2017-01-03 NOTE — Discharge Instructions (Signed)
Your work up today was reassuring, other than showing a mildly low potassium level, you were given potassium here to replenish this. Please continue taking all your usual home medications. Alternate between tylenol and motrin as needed for pain. Stay well hydrated. Use heat compress to area of pain to help with pain relief. Follow up with your regular doctor listed on your medicaid card in 1-2 weeks for recheck of symptoms. Return to the ER for emergent changes or worsening symptoms.   Abdominal (belly) pain can be caused by many things. Your caregiver performed an examination and possibly ordered blood/urine tests and imaging (CT scan, x-rays, ultrasound). Many cases can be observed and treated at home after initial evaluation in the emergency department. Even though you are being discharged home, abdominal pain can be unpredictable. Therefore, you need a repeated exam if your pain does not resolve, returns, or worsens. Most patients with abdominal pain don't have to be admitted to the hospital or have surgery, but serious problems like appendicitis and gallbladder attacks can start out as nonspecific pain. Many abdominal conditions cannot be diagnosed in one visit, so follow-up evaluations are very important. SEEK IMMEDIATE MEDICAL ATTENTION IF YOU DEVELOP ANY OF THE FOLLOWING SYMPTOMS: The pain does not go away or becomes severe.  A temperature above 101 develops.  Repeated vomiting occurs (multiple episodes).  The pain becomes localized to portions of the abdomen. The right side could possibly be appendicitis. In an adult, the left lower portion of the abdomen could be colitis or diverticulitis.  Blood is being passed in stools or vomit (bright red or black tarry stools).  Return also if you develop chest pain, difficulty breathing, dizziness or fainting, or become confused, poorly responsive, or inconsolable (young children). The constipation stays for more than 4 days.  There is belly (abdominal) or  rectal pain.  You do not seem to be getting better.

## 2017-01-04 LAB — GC/CHLAMYDIA PROBE AMP (~~LOC~~) NOT AT ARMC
CHLAMYDIA, DNA PROBE: NEGATIVE
Neisseria Gonorrhea: NEGATIVE

## 2017-02-07 ENCOUNTER — Ambulatory Visit (INDEPENDENT_AMBULATORY_CARE_PROVIDER_SITE_OTHER): Payer: Self-pay | Admitting: *Deleted

## 2017-02-07 DIAGNOSIS — Z3200 Encounter for pregnancy test, result unknown: Secondary | ICD-10-CM

## 2017-02-07 DIAGNOSIS — Z3202 Encounter for pregnancy test, result negative: Secondary | ICD-10-CM

## 2017-02-07 LAB — POCT PREGNANCY, URINE: PREG TEST UR: NEGATIVE

## 2017-02-07 NOTE — Progress Notes (Signed)
Agree with nursing staff's documentation of this patient's clinic encounter.  Kerry Hough, PA-C 02/07/2017 4:53 PM

## 2017-02-07 NOTE — Progress Notes (Signed)
Pt reports that she is 2 days late for her cycle.  UPT is negative here today. She states that she had a home UPT which was faintly positive. She denies abdominal pain or sx of pregnancy other than missed period.  Pt was advised to repeat home UPT in 1 week on first morning urine if she has not started her cycle. If it is positive, she may return here for confirmatory testing. If she develops heavy bleeding unlike a period or has abdominal/pelvic pain she should go to [facility]U for evaluation.  Pt voiced understanding.

## 2017-02-15 ENCOUNTER — Ambulatory Visit: Payer: Self-pay

## 2017-02-27 NOTE — L&D Delivery Note (Signed)
Delivery Note Pt pushed very well with 6-7 contractions.  At 12:47 PM a viable and healthy female was delivered via Vaginal, Spontaneous (Presentation: OA; ROT).  APGAR: 8, 9; weight  .   Placenta status:delivered, intact .  Cord: 3V with the following complications: none.    Anesthesia:  epidural Episiotomy: None Lacerations: Periurethral Suture Repair: N/A Est. Blood Loss (mL): 168  Mom to postpartum.  Baby to Couplet care / Skin to Skin.  Kimberly Gibson 12/12/2017, 1:17 PM  Pt declines BTL - desires Nexplanon/ A+/Br/Bo; RI/no Tdap in [facility]

## 2017-03-08 ENCOUNTER — Other Ambulatory Visit: Payer: Self-pay

## 2017-03-08 ENCOUNTER — Ambulatory Visit (HOSPITAL_COMMUNITY)
Admission: EM | Admit: 2017-03-08 | Discharge: 2017-03-08 | Disposition: A | Discharge status: Home / Self Care | Payer: Self-pay | Attending: Family Medicine | Admitting: Family Medicine

## 2017-03-08 ENCOUNTER — Encounter (HOSPITAL_COMMUNITY): Payer: Self-pay | Admitting: Emergency Medicine

## 2017-03-08 DIAGNOSIS — R102 Pelvic and perineal pain: Secondary | ICD-10-CM | POA: Insufficient documentation

## 2017-03-08 DIAGNOSIS — Z87891 Personal history of nicotine dependence: Secondary | ICD-10-CM | POA: Insufficient documentation

## 2017-03-08 DIAGNOSIS — Z825 Family history of asthma and other chronic lower respiratory diseases: Secondary | ICD-10-CM | POA: Insufficient documentation

## 2017-03-08 LAB — POCT URINALYSIS DIP (DEVICE)
Bilirubin Urine: NEGATIVE
Glucose, UA: NEGATIVE mg/dL
HGB URINE DIPSTICK: NEGATIVE
Ketones, ur: NEGATIVE mg/dL
NITRITE: NEGATIVE
PH: 7 (ref 5.0–8.0)
PROTEIN: NEGATIVE mg/dL
Specific Gravity, Urine: 1.025 (ref 1.005–1.030)
UROBILINOGEN UA: 1 mg/dL (ref 0.0–1.0)

## 2017-03-08 LAB — POCT PREGNANCY, URINE: Preg Test, Ur: NEGATIVE

## 2017-03-08 NOTE — ED Triage Notes (Signed)
Pt c/o pelvic pain since last night, with nausea. No vomiting or diarrhea. Pt states she feels like she has to urinate but nothing comes out.

## 2017-03-08 NOTE — Discharge Instructions (Signed)
Heat packs to abdomen, tylenol and/or ibuprofen for pain control We will call you with any positive tests from your exam today. If you develop increased pain, fevers, vomiting, or otherwise worsening of symptoms please return or go to the Er. If your symptoms persist without improvement please follow up with your primary care provider or with gynecology for further evaluation.

## 2017-03-08 NOTE — ED Provider Notes (Signed)
[place]    CSN: [id number] Arrival date & time: 03/08/17  1059     History   Chief Complaint Chief Complaint  Patient presents with  . Pelvic Pain    HPI Kimberly Gibson is a 22 y.o. female.   Kimberly Gibson presents with complaints of low abdominal pain which started this morning and woke her from sleep. The pain comes and goes. Denies any previous similar. Rates pain 5/10 currently. States she has intermittent nausea, vomited x1. Without vaginal bleeding or discharge. Denies urinary symptoms. Last BM today and was normal for her. Does not remember when her last period was, is not on birth control, is sexually active with 1 partner and does not always use condoms. No known exposure to STD. Without fevers. Has not taken any medications for her symptoms. Per chart review patient with similar complaint 11/7 with recommendations to use tylenol/ibuprofen for pain after negative abdominal work up.     ROS per HPI.       Past Medical History:  Diagnosis Date  . BV (bacterial vaginosis)   . GERD (gastroesophageal reflux disease)   . Normal pregnancy 09/17/2013    Patient Active Problem List   Diagnosis Date Noted  . Adjustment disorder with mixed anxiety and depressed mood 08/28/2015  . GERD (gastroesophageal reflux disease) 08/27/2015  . Overdose of iron or iron compound 08/27/2015  . Microcytic anemia 08/27/2015  . Normal pregnancy, first 09/18/2013  . SVD (spontaneous vaginal delivery) 09/18/2013  . Normal pregnancy 09/17/2013    Past Surgical History:  Procedure Laterality Date  . NO PAST SURGERIES    . WISDOM TOOTH EXTRACTION    . WISDOM TOOTH EXTRACTION      OB History    Gravida Para Term Preterm AB Living   2 1 1  0 1 1   SAB TAB Ectopic Multiple Live Births   1 0 0 0 1       Home Medications    Prior to Admission medications   Medication Sig Start Date End Date Taking? Authorizing Provider  acetaminophen (TYLENOL) 325 MG tablet Take 325 mg every  6 (six) hours as needed by mouth (abdominal pain).    [provider]    Family History Family History  Problem Relation Age of Onset  . Asthma Sister   . Hearing loss Neg Hx     Social History Social History   Tobacco Use  . Smoking status: Former Research scientist (life sciences)  . Smokeless tobacco: Never Used  . Tobacco comment: quit 2011  Substance Use Topics  . Alcohol use: No  . Drug use: No     Allergies   Patient has no known allergies.   Review of Systems Review of Systems   Physical Exam Triage Vital Signs ED Triage Vitals  Enc Vitals Group     BP 03/08/17 1127 138/86     Pulse Rate 03/08/17 1127 (!) 110     Resp 03/08/17 1127 18     Temp 03/08/17 1127 98.3 F (36.8 C)     Temp src --      SpO2 03/08/17 1127 100 %     Weight --      Height --      Head Circumference --      Peak Flow --      Pain Score 03/08/17 1128 6     Pain Loc --      Pain Edu? --      Excl. in [place]? --  No data found.  Updated Vital Signs BP 138/86   Pulse (!) 110   Temp 98.3 F (36.8 C)   Resp 18   LMP 02/07/2017   SpO2 100%   Visual Acuity Right Eye Distance:   Left Eye Distance:   Bilateral Distance:    Right Eye Near:   Left Eye Near:    Bilateral Near:     Physical Exam  Constitutional: She is oriented to person, place, and time. She appears well-developed and well-nourished. No distress.  Cardiovascular: Normal rate, regular rhythm and normal heart sounds.  Pulmonary/Chest: Effort normal and breath sounds normal.  Abdominal: There is tenderness in the right lower quadrant, suprapubic area and left lower quadrant. There is no rigidity, no rebound, no guarding and no CVA tenderness.  Llq> rlq tenderness  Genitourinary: Vagina normal. No tenderness in the vagina. No vaginal discharge found.  Genitourinary Comments: Very mild CMT tenderness on internal exam, general pelvic pain with manual exam  Neurological: She is alert and oriented to person, place, and time.  Skin:  Skin is warm and dry.     UC Treatments / Results  Labs (all labs ordered are listed, but only abnormal results are displayed) Labs Reviewed  POCT URINALYSIS DIP (DEVICE) - Abnormal; Notable for the following components:      Result Value   Leukocytes, UA TRACE (*)    All other components within normal limits  URINE CULTURE  POCT PREGNANCY, URINE  CERVICOVAGINAL ANCILLARY ONLY    EKG  EKG Interpretation None       Radiology No results found.  Procedures Procedures (including critical care time)  Medications Ordered in UC Medications - No data to display   Initial Impression / Assessment and Plan / UC Course  I have reviewed the triage vital signs and the nursing notes.  Pertinent labs & imaging results that were available during my care of the patient were reviewed by me and considered in my medical decision making (see chart for details).  Clinical Course as of Mar 09 1211  Thu Mar 08, 2017  1155 Hr 98   [NB]    Clinical Course User Index [NB] Augusto Gamble B, NP    Mild pelvic pain on exam and with manual exam. Without fevers. Mildly elevated heart rate noted, improved while at rest in exam room. New onset of symptoms today. Non toxic in appearance. Discussed treatment for PID with patient, she declined treatment at this time, states she would like to wait and monitor symptoms. Return precautions provided. Vaginal testing and urine culture pending. Will notify of any positive findings and if any changes to treatment are needed.  Patient verbalized understanding and agreeable to plan.    Final Clinical Impressions(s) / UC Diagnoses   Final diagnoses:  Pelvic pain in female    ED Discharge Orders    None       Controlled Substance Prescriptions [place] Controlled Substance Registry consulted? Not Applicable   Zigmund Gottron, NP 03/08/17 1215

## 2017-03-09 LAB — URINE CULTURE: Culture: NO GROWTH

## 2017-03-09 LAB — CERVICOVAGINAL ANCILLARY ONLY
BACTERIAL VAGINITIS: NEGATIVE
Candida vaginitis: NEGATIVE
Chlamydia: NEGATIVE
Neisseria Gonorrhea: NEGATIVE
TRICH (WINDOWPATH): NEGATIVE

## 2017-04-04 ENCOUNTER — Ambulatory Visit (HOSPITAL_COMMUNITY)
Admission: EM | Admit: 2017-04-04 | Discharge: 2017-04-04 | Disposition: A | Discharge status: Home / Self Care | Payer: Medicaid Other | Attending: Family Medicine | Admitting: Family Medicine

## 2017-04-04 ENCOUNTER — Telehealth (HOSPITAL_COMMUNITY): Payer: Self-pay | Admitting: Emergency Medicine

## 2017-04-04 ENCOUNTER — Encounter (HOSPITAL_COMMUNITY): Payer: Self-pay | Admitting: *Deleted

## 2017-04-04 DIAGNOSIS — N926 Irregular menstruation, unspecified: Secondary | ICD-10-CM | POA: Insufficient documentation

## 2017-04-04 DIAGNOSIS — Z87891 Personal history of nicotine dependence: Secondary | ICD-10-CM | POA: Diagnosis not present

## 2017-04-04 DIAGNOSIS — Z3202 Encounter for pregnancy test, result negative: Secondary | ICD-10-CM | POA: Diagnosis not present

## 2017-04-04 LAB — POCT PREGNANCY, URINE: PREG TEST UR: NEGATIVE

## 2017-04-04 LAB — HCG, QUANTITATIVE, PREGNANCY: HCG, BETA CHAIN, QUANT, S: 16 m[IU]/mL — AB (ref <5)

## 2017-04-04 LAB — HCG, SERUM, QUALITATIVE: PREG SERUM: POSITIVE — AB

## 2017-04-04 NOTE — ED Triage Notes (Signed)
Patient has irregular periods, states she thinks she may be pregnant. Unsure of lmp.

## 2017-04-04 NOTE — Telephone Encounter (Signed)
Pt called asking about HCG test. Identity confirmed. Pt told her HCG was weak negative, pt requesting to have the HCG quantitative results. HCG quantitative results called to patient, pt instructed that she was very early pregnant and to make follow up appt with OBGYN. Pt verbalized understanding and had no further questions.

## 2017-04-04 NOTE — ED Provider Notes (Signed)
  [facility]   [id number] 04/04/17 Arrival Time: 6270  ASSESSMENT & PLAN:  1. Irregular periods/menstrual cycles    UPT negative here. She reports positive UPT at home.  Labs Reviewed  HCG, SERUM, QUALITATIVE  POCT PREGNANCY, URINE   Serum HCG pending. Will notify her of results. She has MyChart also.  Reviewed expectations re: course of current medical issues. Questions answered. Outlined signs and symptoms indicating need for more acute intervention. Patient verbalized understanding. After Visit Summary given.   SUBJECTIVE: History from: patient. Kimberly Gibson is a 22 y.o. female who reports long history of irregular periods. Requests pregnancy test. Reports positive UPT at home this morning. No n/v/abdominal pain.  OB History    Gravida Para Term Preterm AB Living   2 1 1  0 1 1   SAB TAB Ectopic Multiple Live Births   1 0 0 0 1     Unsure of LMP. "Maybe one a few weeks ago."  ROS: As per HPI.   OBJECTIVE:  General appearance: alert; no distress Lungs: clear to auscultation bilaterally Heart: regular rate and rhythm Abdomen: soft, non-tender; bowel sounds normal; no masses or organomegaly; no guarding or rebound tenderness Back: no CVA tenderness Extremities: no cyanosis or edema; symmetrical with no gross deformities Skin: warm and dry Psychological: alert and cooperative; normal mood and affect  Labs: Results for orders placed or performed during the hospital encounter of 04/04/17  Pregnancy, urine POC  Result Value Ref Range   Preg Test, Ur NEGATIVE NEGATIVE   Labs Reviewed  HCG, SERUM, QUALITATIVE  POCT PREGNANCY, URINE     No Known Allergies  Past Medical History:  Diagnosis Date  . BV (bacterial vaginosis)   . GERD (gastroesophageal reflux disease)   . Normal pregnancy 09/17/2013   Social History   Socioeconomic History  . Marital status: Single    Spouse name: Not on file  . Number of children: Not on file  . Years of  education: Not on file  . Highest education level: Not on file  Social Needs  . Financial resource strain: Not on file  . Food insecurity - worry: Not on file  . Food insecurity - inability: Not on file  . Transportation needs - medical: Not on file  . Transportation needs - non-medical: Not on file  Occupational History  . Not on file  Tobacco Use  . Smoking status: Former Research scientist (life sciences)  . Smokeless tobacco: Never Used  . Tobacco comment: quit 2011  Substance and Sexual Activity  . Alcohol use: No  . Drug use: No  . Sexual activity: Yes  Other Topics Concern  . Not on file  Social History Narrative  . Not on file   Family History  Problem Relation Age of Onset  . Asthma Sister   . Hearing loss Neg Hx    Past Surgical History:  Procedure Laterality Date  . NO PAST SURGERIES    . WISDOM TOOTH EXTRACTION    . WISDOM TOOTH EXTRACTION       Vanessa Kick, MD 04/04/17 1208

## 2017-04-06 ENCOUNTER — Other Ambulatory Visit: Payer: Medicaid Other

## 2017-04-06 DIAGNOSIS — O3680X0 Pregnancy with inconclusive fetal viability, not applicable or unspecified: Secondary | ICD-10-CM

## 2017-04-07 LAB — BETA HCG QUANT (REF LAB): hCG Quant: 42 m[IU]/mL

## 2017-04-08 ENCOUNTER — Encounter: Payer: Self-pay | Admitting: Student

## 2017-04-08 ENCOUNTER — Inpatient Hospital Stay (HOSPITAL_COMMUNITY)
Admission: AD | Admit: 2017-04-08 | Discharge: 2017-04-08 | Disposition: A | Discharge status: Home / Self Care | Payer: Medicaid Other | Source: Ambulatory Visit | Attending: Obstetrics and Gynecology | Admitting: Obstetrics and Gynecology

## 2017-04-08 ENCOUNTER — Inpatient Hospital Stay (HOSPITAL_COMMUNITY): Payer: Medicaid Other

## 2017-04-08 DIAGNOSIS — M549 Dorsalgia, unspecified: Secondary | ICD-10-CM | POA: Diagnosis not present

## 2017-04-08 DIAGNOSIS — R102 Pelvic and perineal pain: Secondary | ICD-10-CM | POA: Diagnosis present

## 2017-04-08 DIAGNOSIS — O26891 Other specified pregnancy related conditions, first trimester: Secondary | ICD-10-CM | POA: Diagnosis not present

## 2017-04-08 DIAGNOSIS — Z3A01 Less than 8 weeks gestation of pregnancy: Secondary | ICD-10-CM | POA: Diagnosis not present

## 2017-04-08 DIAGNOSIS — O3680X0 Pregnancy with inconclusive fetal viability, not applicable or unspecified: Secondary | ICD-10-CM

## 2017-04-08 DIAGNOSIS — Z87891 Personal history of nicotine dependence: Secondary | ICD-10-CM | POA: Insufficient documentation

## 2017-04-08 DIAGNOSIS — O26899 Other specified pregnancy related conditions, unspecified trimester: Secondary | ICD-10-CM

## 2017-04-08 DIAGNOSIS — O209 Hemorrhage in early pregnancy, unspecified: Secondary | ICD-10-CM

## 2017-04-08 DIAGNOSIS — R109 Unspecified abdominal pain: Secondary | ICD-10-CM | POA: Insufficient documentation

## 2017-04-08 LAB — CBC
HEMATOCRIT: 33.5 % — AB (ref 36.0–46.0)
Hemoglobin: 10.9 g/dL — ABNORMAL LOW (ref 12.0–15.0)
MCH: 25.1 pg — AB (ref 26.0–34.0)
MCHC: 32.5 g/dL (ref 30.0–36.0)
MCV: 77 fL — AB (ref 78.0–100.0)
Platelets: 291 10*3/uL (ref 150–400)
RBC: 4.35 MIL/uL (ref 3.87–5.11)
RDW: 18.1 % — AB (ref 11.5–15.5)
WBC: 8.9 10*3/uL (ref 4.0–10.5)

## 2017-04-08 LAB — URINALYSIS, ROUTINE W REFLEX MICROSCOPIC
BILIRUBIN URINE: NEGATIVE
Glucose, UA: NEGATIVE mg/dL
HGB URINE DIPSTICK: NEGATIVE
Ketones, ur: NEGATIVE mg/dL
Leukocytes, UA: NEGATIVE
Nitrite: NEGATIVE
PH: 7 (ref 5.0–8.0)
Protein, ur: NEGATIVE mg/dL
SPECIFIC GRAVITY, URINE: 1.012 (ref 1.005–1.030)

## 2017-04-08 LAB — HCG, QUANTITATIVE, PREGNANCY: hCG, Beta Chain, Quant, S: 94 m[IU]/mL — ABNORMAL HIGH (ref <5)

## 2017-04-08 NOTE — Progress Notes (Signed)
Back from [place], waiting on results

## 2017-04-08 NOTE — Discharge Instructions (Signed)

## 2017-04-08 NOTE — Progress Notes (Signed)
6063: Pt off to [place]

## 2017-04-08 NOTE — MAU Provider Note (Signed)
History     CSN: [id number]  Arrival date and time: 04/08/17 5462   First Provider Initiated Contact with Patient 04/08/17 [phone]      Chief Complaint  Patient presents with  . Pelvic Pain  . Vaginal Bleeding   HPI Kimberly Gibson is a 22 y.o. G3P1011 at [redacted]w[redacted]d by LMP who presents with abdominal pain and vaginal bleeding. Symptoms began this morning at 130. Went to the restroom and saw bright red blood on tissue when she wiped. Bleeding has not continued. Endorses lower abdominal cramping that she rates 7/10. Has not treated symptoms. Some nausea, no vomiting. Denies dysuria, vaginal discharge, diarrhea, or recent intercourse. Had vaginal swabs collected 3 weeks ago that were negative.    OB History    Gravida Para Term Preterm AB Living   3 1 1  0 1 1   SAB TAB Ectopic Multiple Live Births   1 0 0 0 1      Past Medical History:  Diagnosis Date  . BV (bacterial vaginosis)   . GERD (gastroesophageal reflux disease)   . Normal pregnancy 09/17/2013    Past Surgical History:  Procedure Laterality Date  . WISDOM TOOTH EXTRACTION      Family History  Problem Relation Age of Onset  . Asthma Sister   . Hearing loss Neg Hx     Social History   Tobacco Use  . Smoking status: Former Research scientist (life sciences)  . Smokeless tobacco: Never Used  . Tobacco comment: quit 2011  Substance Use Topics  . Alcohol use: No  . Drug use: No    Allergies: No Known Allergies  Medications Prior to Admission  Medication Sig Dispense Refill Last Dose  . acetaminophen (TYLENOL) 325 MG tablet Take 325 mg every 6 (six) hours as needed by mouth (abdominal pain).   01/02/2017 at Unknown time    Review of Systems  Constitutional: Negative.   Gastrointestinal: Positive for abdominal pain and nausea. Negative for constipation, diarrhea and vomiting.  Genitourinary: Positive for vaginal bleeding. Negative for dysuria and vaginal discharge.  Musculoskeletal: Positive for back pain.   Physical Exam   Blood pressure  114/80, pulse 81, temperature 97.7 F (36.5 C), resp. rate 18, height 5\' 5"  (1.651 m), weight 150 lb (68 kg), last menstrual period 02/07/2017, unknown if currently breastfeeding.  Physical Exam  Nursing note and vitals reviewed. Constitutional: She is oriented to person, place, and time. She appears well-developed and well-nourished. No distress.  HENT:  Head: Normocephalic and atraumatic.  Eyes: Conjunctivae are normal. Right eye exhibits no discharge. Left eye exhibits no discharge. No scleral icterus.  Neck: Normal range of motion.  Respiratory: Effort normal. No respiratory distress.  GI: Soft. There is tenderness in the right lower quadrant and suprapubic area. There is no rigidity, no rebound and no guarding.  Neurological: She is alert and oriented to person, place, and time.  Skin: Skin is warm and dry. She is not diaphoretic.  Psychiatric: She has a normal mood and affect. Her behavior is normal. Judgment and thought content normal.    MAU Course  Procedures Results for orders placed or performed during the hospital encounter of 04/08/17 (from the past 24 hour(s))  Urinalysis, Routine w reflex microscopic     Status: Abnormal   Collection Time: 04/08/17  2:50 AM  Result Value Ref Range   Color, Urine STRAW (A) YELLOW   APPearance CLEAR CLEAR   Specific Gravity, Urine 1.012 1.005 - 1.030   pH 7.0 5.0 - 8.0  Glucose, UA NEGATIVE NEGATIVE mg/dL   Hgb urine dipstick NEGATIVE NEGATIVE   Bilirubin Urine NEGATIVE NEGATIVE   Ketones, ur NEGATIVE NEGATIVE mg/dL   Protein, ur NEGATIVE NEGATIVE mg/dL   Nitrite NEGATIVE NEGATIVE   Leukocytes, UA NEGATIVE NEGATIVE  CBC     Status: Abnormal   Collection Time: 04/08/17  3:10 AM  Result Value Ref Range   WBC 8.9 4.0 - 10.5 K/uL   RBC 4.35 3.87 - 5.11 MIL/uL   Hemoglobin 10.9 (L) 12.0 - 15.0 g/dL   HCT 33.5 (L) 36.0 - 46.0 %   MCV 77.0 (L) 78.0 - 100.0 fL   MCH 25.1 (L) 26.0 - 34.0 pg   MCHC 32.5 30.0 - 36.0 g/dL   RDW 18.1 (H)  11.5 - 15.5 %   Platelets 291 150 - 400 K/uL  hCG, quantitative, pregnancy     Status: Abnormal   Collection Time: 04/08/17  3:10 AM  Result Value Ref Range   hCG, Beta Chain, Quant, S 94 (H) <5 mIU/mL   US Ob Less Than 14 Weeks With Ob Transvaginal  Result Date: 04/08/2017 CLINICAL DATA:  Acute onset of generalized abdominal pain and vaginal spotting. EXAM: OBSTETRIC <14 WK [place] AND TRANSVAGINAL OB US TECHNIQUE: Both transabdominal and transvaginal ultrasound examinations were performed for complete evaluation of the gestation as well as the maternal uterus, adnexal regions, and pelvic cul-de-sac. Transvaginal technique was performed to assess early pregnancy. COMPARISON:  None. FINDINGS: Intrauterine gestational sac: None seen. Yolk sac:  N/A Embryo:  N/A Subchorionic hemorrhage:  None visualized. Maternal uterus/adnexae: The uterus is unremarkable in appearance. The ovaries are within normal limits. The right ovary measures is 3.9 x 2.0 x 2.6 cm, while the left ovary measures 2.2 x 1.7 x 2.1 cm. The no suspicious adnexal masses are seen; there is no evidence for ovarian torsion. A small amount of free fluid is seen within the pelvic cul-de-sac. IMPRESSION: No intrauterine gestational sac seen at this time. No evidence for ectopic pregnancy. This remains within normal limits, given the quantitative beta HCG level of 94. If the patient's quantitative beta HCG continues to trend upward, follow-up pelvic ultrasound could be performed in 2 weeks for further evaluation. Electronically Signed   By: Garald Balding M.D.   On: 04/08/2017 04:23    MDM A positive Appropriate increase in HCG x 2 Ultrasound shows no IUP or adnexal mass  Assessment and Plan  A; 1. Pregnancy of unknown anatomic location   2. Vaginal bleeding in pregnancy, first trimester   3. Abdominal pain affecting pregnancy    P: Discharge home Pelvic rest Discussed reasons to return to MAU Outpatient ultrasound for viability in 2  weeks  Jorje Guild 04/08/2017, 3:33 AM

## 2017-04-08 NOTE — MAU Note (Signed)
Awoke about 0130 with some pain lower L back and lower abd pain. Went to BR and saw bright blood on tissue when I wiped. Pos upt [facility]

## 2017-04-22 ENCOUNTER — Other Ambulatory Visit: Payer: Self-pay

## 2017-04-22 ENCOUNTER — Inpatient Hospital Stay (HOSPITAL_COMMUNITY)
Admission: AD | Admit: 2017-04-22 | Discharge: 2017-04-22 | Disposition: A | Discharge status: Home / Self Care | Payer: Medicaid Other | Source: Ambulatory Visit | Attending: Obstetrics and Gynecology | Admitting: Obstetrics and Gynecology

## 2017-04-22 DIAGNOSIS — O9989 Other specified diseases and conditions complicating pregnancy, childbirth and the puerperium: Secondary | ICD-10-CM

## 2017-04-22 DIAGNOSIS — Z87891 Personal history of nicotine dependence: Secondary | ICD-10-CM | POA: Diagnosis not present

## 2017-04-22 DIAGNOSIS — J069 Acute upper respiratory infection, unspecified: Secondary | ICD-10-CM | POA: Insufficient documentation

## 2017-04-22 DIAGNOSIS — O99511 Diseases of the respiratory system complicating pregnancy, first trimester: Secondary | ICD-10-CM | POA: Insufficient documentation

## 2017-04-22 DIAGNOSIS — Z3A10 10 weeks gestation of pregnancy: Secondary | ICD-10-CM | POA: Insufficient documentation

## 2017-04-22 DIAGNOSIS — R6883 Chills (without fever): Secondary | ICD-10-CM | POA: Diagnosis present

## 2017-04-22 LAB — URINALYSIS, ROUTINE W REFLEX MICROSCOPIC
Bilirubin Urine: NEGATIVE
GLUCOSE, UA: NEGATIVE mg/dL
Hgb urine dipstick: NEGATIVE
KETONES UR: 20 mg/dL — AB
Leukocytes, UA: NEGATIVE
Nitrite: NEGATIVE
PH: 7 (ref 5.0–8.0)
Protein, ur: NEGATIVE mg/dL
Specific Gravity, Urine: 1.019 (ref 1.005–1.030)

## 2017-04-22 LAB — INFLUENZA PANEL BY PCR (TYPE A & B)
INFLAPCR: NEGATIVE
Influenza B By PCR: NEGATIVE

## 2017-04-22 NOTE — MAU Provider Note (Addendum)
History     CSN: [id number]  Arrival date and time: 04/22/17 1010   First Provider Initiated Contact with Patient 04/22/17 1143      Chief Complaint  Patient presents with  . URI   Kimberly Gibson is a 22 y.o. G3P1011 [redacted]w[redacted]d who presents today with URI sx since last night. She reports that her son had a "cold" last week, and was sick for about 2 days. No recent flu exposure. She had a flu vaccine this year.    URI   This is a new problem. The current episode started yesterday. The problem has been unchanged. There has been no fever. Associated symptoms include congestion, headaches and rhinorrhea. Pertinent negatives include no coughing, ear pain or sore throat. She has tried nothing for the symptoms.   Past Medical History:  Diagnosis Date  . BV (bacterial vaginosis)   . GERD (gastroesophageal reflux disease)   . Normal pregnancy 09/17/2013    Past Surgical History:  Procedure Laterality Date  . WISDOM TOOTH EXTRACTION      Family History  Problem Relation Age of Onset  . Asthma Sister   . Hearing loss Neg Hx     Social History   Tobacco Use  . Smoking status: Former Research scientist (life sciences)  . Smokeless tobacco: Never Used  . Tobacco comment: quit 2011  Substance Use Topics  . Alcohol use: No  . Drug use: No    Allergies: No Known Allergies  Medications Prior to Admission  Medication Sig Dispense Refill Last Dose  . acetaminophen (TYLENOL) 325 MG tablet Take 325 mg every 6 (six) hours as needed by mouth (abdominal pain).   01/02/2017 at Unknown time    Review of Systems  Constitutional: Negative for chills and fever.       "I feel hot"  HENT: Positive for congestion and rhinorrhea. Negative for ear pain and sore throat.   Respiratory: Negative for cough.   Musculoskeletal: Positive for myalgias.  Neurological: Positive for headaches.   Physical Exam   Blood pressure 122/68, pulse 84, temperature 98.8 F (37.1 C), temperature source Oral, resp. rate 20, height 5\' 5"  (1.651  m), weight 147 lb 12 oz (67 kg), last menstrual period 02/07/2017, SpO2 100 %, unknown if currently breastfeeding.  Physical Exam  Nursing note and vitals reviewed. Constitutional: She is oriented to person, place, and time. She appears well-developed and well-nourished. No distress.  HENT:  Head: Normocephalic.  Cardiovascular: Normal rate.  Respiratory: Effort normal.  GI: Soft. There is no tenderness.  Neurological: She is alert and oriented to person, place, and time.  Skin: Skin is warm and dry.  Psychiatric: She has a normal mood and affect.     Results for orders placed or performed during the hospital encounter of 04/22/17 (from the past 24 hour(s))  Influenza panel by PCR (type A & B)     Status: None   Collection Time: 04/22/17 10:27 AM  Result Value Ref Range   Influenza A By PCR NEGATIVE NEGATIVE   Influenza B By PCR NEGATIVE NEGATIVE  Urinalysis, Routine w reflex microscopic     Status: Abnormal   Collection Time: 04/22/17 10:30 AM  Result Value Ref Range   Color, Urine YELLOW YELLOW   APPearance CLEAR CLEAR   Specific Gravity, Urine 1.019 1.005 - 1.030   pH 7.0 5.0 - 8.0   Glucose, UA NEGATIVE NEGATIVE mg/dL   Hgb urine dipstick NEGATIVE NEGATIVE   Bilirubin Urine NEGATIVE NEGATIVE   Ketones, ur 20 (  A) NEGATIVE mg/dL   Protein, ur NEGATIVE NEGATIVE mg/dL   Nitrite NEGATIVE NEGATIVE   Leukocytes, UA NEGATIVE NEGATIVE     MAU Course  Procedures  MDM   Assessment and Plan   1. Viral URI    DC home Comfort measures reviewed  1st Trimester precautions  RX: none, OTC treatments reviewed, safe medications in pregnancy list given  Return to MAU as needed FU with OB as planned FU with [place] tomorrow as planned   Follow-up Information    Department, [facility] Follow up.   Contact information: [place] [address] [phone]            Marcille Buffy 04/22/2017, 11:45 AM

## 2017-04-22 NOTE — MAU Note (Signed)
Pt presents with c/o body aches, chills, emesis, and H/A that began yesterday.  Reports unable to keep anything down. Denies VB, minimal abdominal cramping.

## 2017-04-22 NOTE — Discharge Instructions (Signed)
Safe Medications in Pregnancy   Acne: Benzoyl Peroxide Salicylic Acid  Backache/Headache: Tylenol: 2 regular strength every 4 hours OR              2 Extra strength every 6 hours  Colds/Coughs/Allergies: Benadryl (alcohol free) 25 mg every 6 hours as needed Breath right strips Claritin Cepacol throat lozenges Chloraseptic throat spray Cold-Eeze- up to three times per day Cough drops, alcohol free Flonase (by prescription only) Guaifenesin Mucinex Robitussin DM (plain only, alcohol free) Saline nasal spray/drops Sudafed (pseudoephedrine) & Actifed ** use only after [redacted] weeks gestation and if you do not have high blood pressure Tylenol Vicks Vaporub Zinc lozenges Zyrtec   Constipation: Colace Ducolax suppositories Fleet enema Glycerin suppositories Metamucil Milk of magnesia Miralax Senokot Smooth move tea  Diarrhea: Kaopectate Imodium A-D  *NO pepto Bismol  Hemorrhoids: Anusol Anusol HC Preparation H Tucks  Indigestion: Tums Maalox Mylanta Zantac  Pepcid  Insomnia: Benadryl (alcohol free) 25mg  every 6 hours as needed Tylenol PM Unisom, no Gelcaps  Leg Cramps: Tums MagGel  Nausea/Vomiting:  Bonine Dramamine Emetrol Ginger extract Sea bands Meclizine  Nausea medication to take during pregnancy:  Unisom (doxylamine succinate 25 mg tablets) Take one tablet daily at bedtime. If symptoms are not adequately controlled, the dose can be increased to a maximum recommended dose of two tablets daily (1/2 tablet in the morning, 1/2 tablet mid-afternoon and one at bedtime). Vitamin B6 100mg  tablets. Take one tablet twice a day (up to 200 mg per day).  Skin Rashes: Aveeno products Benadryl cream or 25mg  every 6 hours as needed Calamine Lotion 1% cortisone cream  Yeast infection: Gyne-lotrimin 7 Monistat 7   **If taking multiple medications, please check labels to avoid duplicating the same active ingredients **take medication as directed on  the label ** Do not exceed 4000 mg of tylenol in 24 hours **Do not take medications that contain aspirin or ibuprofen  What is the difference between a cold and flu?  Flu and the common cold are both respiratory illnesses but they are caused by different viruses. Because these two types of illnesses have similar symptoms, it can be difficult to tell the difference between them based on symptoms alone. In general, flu is worse than the common cold, and symptoms are more intense. Colds are usually milder than flu. People with colds are more likely to have a runny or stuffy nose. Colds generally do not result in serious health problems, such as pneumonia, bacterial infections, or hospitalizations. Flu can have very serious associated complications.  How can you tell the difference between a cold and the flu?  Because colds and flu share many symptoms, it can be difficult (or even impossible) to tell the difference between them based on symptoms alone. Special tests that usually must be done within the first few days of illness can tell if a person has the flu.  What are the symptoms of the flu versus the symptoms of a cold?  The symptoms of flu can include fever or feeling feverish/chills, cough, sore throat, runny or stuffy nose, muscle or body aches, headaches and fatigue (tiredness). Cold symptoms are usually milder than the symptoms of flu. People with colds are more likely to have a runny or stuffy nose. Colds generally do not result in serious health problems.

## 2017-04-23 ENCOUNTER — Encounter: Payer: Self-pay | Admitting: General Practice

## 2017-04-23 ENCOUNTER — Ambulatory Visit (HOSPITAL_COMMUNITY)
Admission: RE | Admit: 2017-04-23 | Discharge: 2017-04-23 | Disposition: A | Discharge status: Home / Self Care | Payer: Medicaid Other | Source: Ambulatory Visit | Attending: Student | Admitting: Student

## 2017-04-23 ENCOUNTER — Ambulatory Visit: Payer: Medicaid Other | Admitting: General Practice

## 2017-04-23 DIAGNOSIS — Z712 Person consulting for explanation of examination or test findings: Secondary | ICD-10-CM

## 2017-04-23 DIAGNOSIS — Z3A01 Less than 8 weeks gestation of pregnancy: Secondary | ICD-10-CM | POA: Diagnosis not present

## 2017-04-23 DIAGNOSIS — O209 Hemorrhage in early pregnancy, unspecified: Secondary | ICD-10-CM | POA: Diagnosis present

## 2017-04-23 DIAGNOSIS — R109 Unspecified abdominal pain: Secondary | ICD-10-CM | POA: Insufficient documentation

## 2017-04-23 DIAGNOSIS — O26891 Other specified pregnancy related conditions, first trimester: Secondary | ICD-10-CM | POA: Diagnosis not present

## 2017-04-23 DIAGNOSIS — O26899 Other specified pregnancy related conditions, unspecified trimester: Secondary | ICD-10-CM

## 2017-04-23 DIAGNOSIS — O3680X0 Pregnancy with inconclusive fetal viability, not applicable or unspecified: Secondary | ICD-10-CM

## 2017-04-23 NOTE — Progress Notes (Signed)
Patient here for viability results today. Reviewed ultrasound report with Dr Rip Harbour who finds living IUP, patient should begin prenatal care. Informed patient of results, dating information, & provided pictures. Patient verbalized understanding and denies taking any meds, only PNV. Patient already has appt to start OB care. Patient had no questions

## 2017-05-05 ENCOUNTER — Inpatient Hospital Stay (HOSPITAL_COMMUNITY)
Admission: AD | Admit: 2017-05-05 | Discharge: 2017-05-05 | Disposition: A | Discharge status: Home / Self Care | Payer: Medicaid Other | Source: Ambulatory Visit | Attending: Obstetrics and Gynecology | Admitting: Obstetrics and Gynecology

## 2017-05-05 ENCOUNTER — Encounter (HOSPITAL_COMMUNITY): Payer: Self-pay

## 2017-05-05 DIAGNOSIS — E860 Dehydration: Secondary | ICD-10-CM

## 2017-05-05 DIAGNOSIS — O219 Vomiting of pregnancy, unspecified: Secondary | ICD-10-CM | POA: Diagnosis not present

## 2017-05-05 DIAGNOSIS — K219 Gastro-esophageal reflux disease without esophagitis: Secondary | ICD-10-CM | POA: Insufficient documentation

## 2017-05-05 DIAGNOSIS — O99619 Diseases of the digestive system complicating pregnancy, unspecified trimester: Secondary | ICD-10-CM | POA: Insufficient documentation

## 2017-05-05 DIAGNOSIS — Z87891 Personal history of nicotine dependence: Secondary | ICD-10-CM | POA: Diagnosis not present

## 2017-05-05 DIAGNOSIS — Z3A00 Weeks of gestation of pregnancy not specified: Secondary | ICD-10-CM | POA: Insufficient documentation

## 2017-05-05 LAB — URINALYSIS, ROUTINE W REFLEX MICROSCOPIC
BACTERIA UA: NONE SEEN
Bilirubin Urine: NEGATIVE
GLUCOSE, UA: NEGATIVE mg/dL
HGB URINE DIPSTICK: NEGATIVE
KETONES UR: 80 mg/dL — AB
NITRITE: NEGATIVE
Protein, ur: 30 mg/dL — AB
Specific Gravity, Urine: 1.032 — ABNORMAL HIGH (ref 1.005–1.030)
pH: 6 (ref 5.0–8.0)

## 2017-05-05 MED ORDER — ONDANSETRON 8 MG PO TBDP
8.0000 mg | ORAL_TABLET | Freq: Once | ORAL | Status: AC
  Administered 2017-05-05: 8 mg via ORAL
  Filled 2017-05-05: qty 1

## 2017-05-05 MED ORDER — LACTATED RINGERS IV BOLUS (SEPSIS)
2000.0000 mL | Freq: Once | INTRAVENOUS | Status: AC
  Administered 2017-05-05: 2000 mL via INTRAVENOUS

## 2017-05-05 MED ORDER — PROMETHAZINE HCL 12.5 MG PO TABS: 12.5000 mg | ORAL_TABLET | Freq: Four times a day (QID) | ORAL | 4 refills | Status: DC | PRN

## 2017-05-05 NOTE — Discharge Instructions (Signed)

## 2017-05-05 NOTE — MAU Note (Signed)
Nausea and vomiting for about 2 weeks. Has been unable to get medication due to issues with Medicaid. States it is getting worse.

## 2017-05-05 NOTE — MAU Provider Note (Signed)
History   G3P1011 in with nausea and vomiting of pregnancy. Has been going on for two weeks.  CSN: [id number]  Arrival date & time 05/05/17  1348   None     Chief Complaint  Patient presents with  . Emesis  . Nausea    HPI  Past Medical History:  Diagnosis Date  . BV (bacterial vaginosis)   . GERD (gastroesophageal reflux disease)   . Normal pregnancy 09/17/2013    Past Surgical History:  Procedure Laterality Date  . WISDOM TOOTH EXTRACTION      Family History  Problem Relation Age of Onset  . Asthma Sister   . Hearing loss Neg Hx     Social History   Tobacco Use  . Smoking status: Former Research scientist (life sciences)  . Smokeless tobacco: Never Used  . Tobacco comment: quit 2011  Substance Use Topics  . Alcohol use: No  . Drug use: No    OB History    Gravida Para Term Preterm AB Living   3 1 1  0 1 1   SAB TAB Ectopic Multiple Live Births   1 0 0 0 1      Review of Systems  Constitutional: Negative.   HENT: Negative.   Eyes: Negative.   Respiratory: Negative.   Cardiovascular: Negative.   Gastrointestinal: Positive for nausea and vomiting.  Endocrine: Negative.   Genitourinary: Negative.   Musculoskeletal: Negative.   Skin: Negative.   Allergic/Immunologic: Negative.   Neurological: Negative.   Hematological: Negative.   Psychiatric/Behavioral: Negative.     Allergies  Patient has no known allergies.  Home Medications    BP 103/67 (BP Location: Left Arm)   Pulse 90   Temp 98.6 F (37 C) (Oral)   Resp 18   Ht 5\' 5"  (1.651 m)   Wt 143 lb (64.9 kg)   LMP 02/07/2017   BMI 23.80 kg/m   Physical Exam  Constitutional: She is oriented to person, place, and time. She appears well-developed and well-nourished.  HENT:  Head: Normocephalic.  Cardiovascular: Normal rate, regular rhythm, normal heart sounds and intact distal pulses.  Pulmonary/Chest: Effort normal and breath sounds normal.  Abdominal: Soft. Bowel sounds are normal.  Musculoskeletal: Normal  range of motion.  Neurological: She is alert and oriented to person, place, and time. She has normal reflexes.  Skin: Skin is warm and dry.  Psychiatric: She has a normal mood and affect. Her behavior is normal. Judgment and thought content normal.    MAU Course  Procedures (including critical care time)  Labs Reviewed  URINALYSIS, ROUTINE W REFLEX MICROSCOPIC - Abnormal; Notable for the following components:      Result Value   APPearance HAZY (*)    Specific Gravity, Urine 1.032 (*)    Ketones, ur 80 (*)    Protein, ur 30 (*)    Leukocytes, UA TRACE (*)    Squamous Epithelial / LPF 6-30 (*)    All other components within normal limits   No results found.   1. Nausea and vomiting of pregnancy, antepartum   2. Dehydration       MDM  VSS, u/a 80 ketones. Will IV hydrate and give some zofran ODT 15:25 pt retainning cler fluids. POC discussed with Dr. Terri Piedra if continues to retain fluid will d/c home on phenergan.

## 2017-05-13 ENCOUNTER — Inpatient Hospital Stay (HOSPITAL_COMMUNITY)
Admission: AD | Admit: 2017-05-13 | Discharge: 2017-05-13 | Discharge status: Against Medical Advice | Payer: Medicaid Other | Source: Ambulatory Visit | Attending: Obstetrics & Gynecology | Admitting: Obstetrics & Gynecology

## 2017-05-13 ENCOUNTER — Encounter (HOSPITAL_COMMUNITY): Payer: Self-pay | Admitting: *Deleted

## 2017-05-13 DIAGNOSIS — Z87891 Personal history of nicotine dependence: Secondary | ICD-10-CM | POA: Insufficient documentation

## 2017-05-13 DIAGNOSIS — K219 Gastro-esophageal reflux disease without esophagitis: Secondary | ICD-10-CM | POA: Insufficient documentation

## 2017-05-13 DIAGNOSIS — O219 Vomiting of pregnancy, unspecified: Secondary | ICD-10-CM

## 2017-05-13 DIAGNOSIS — E860 Dehydration: Secondary | ICD-10-CM | POA: Insufficient documentation

## 2017-05-13 DIAGNOSIS — R102 Pelvic and perineal pain: Secondary | ICD-10-CM | POA: Diagnosis not present

## 2017-05-13 DIAGNOSIS — O26851 Spotting complicating pregnancy, first trimester: Secondary | ICD-10-CM

## 2017-05-13 DIAGNOSIS — O99611 Diseases of the digestive system complicating pregnancy, first trimester: Secondary | ICD-10-CM | POA: Insufficient documentation

## 2017-05-13 DIAGNOSIS — O210 Mild hyperemesis gravidarum: Secondary | ICD-10-CM | POA: Diagnosis present

## 2017-05-13 DIAGNOSIS — O99281 Endocrine, nutritional and metabolic diseases complicating pregnancy, first trimester: Secondary | ICD-10-CM | POA: Insufficient documentation

## 2017-05-13 DIAGNOSIS — Z6790 Unspecified blood type, Rh positive: Secondary | ICD-10-CM

## 2017-05-13 DIAGNOSIS — O26891 Other specified pregnancy related conditions, first trimester: Secondary | ICD-10-CM | POA: Diagnosis present

## 2017-05-13 DIAGNOSIS — Z3A09 9 weeks gestation of pregnancy: Secondary | ICD-10-CM | POA: Insufficient documentation

## 2017-05-13 LAB — CBC
HEMATOCRIT: 37.8 % (ref 36.0–46.0)
HEMOGLOBIN: 12.8 g/dL (ref 12.0–15.0)
MCH: 26.6 pg (ref 26.0–34.0)
MCHC: 33.9 g/dL (ref 30.0–36.0)
MCV: 78.6 fL (ref 78.0–100.0)
PLATELETS: 335 10*3/uL (ref 150–400)
RBC: 4.81 MIL/uL (ref 3.87–5.11)
RDW: 18.1 % — ABNORMAL HIGH (ref 11.5–15.5)
WBC: 11 10*3/uL — AB (ref 4.0–10.5)

## 2017-05-13 LAB — COMPREHENSIVE METABOLIC PANEL
ALK PHOS: 47 U/L (ref 38–126)
ALT: 10 U/L — AB (ref 14–54)
AST: 24 U/L (ref 15–41)
Albumin: 3.9 g/dL (ref 3.5–5.0)
Anion gap: 11 (ref 5–15)
BILIRUBIN TOTAL: 0.6 mg/dL (ref 0.3–1.2)
BUN: 7 mg/dL (ref 6–20)
CALCIUM: 9.3 mg/dL (ref 8.9–10.3)
CHLORIDE: 104 mmol/L (ref 101–111)
CO2: 20 mmol/L — ABNORMAL LOW (ref 22–32)
CREATININE: 0.55 mg/dL (ref 0.44–1.00)
GFR calc non Af Amer: >60 mL/min (ref >60)
Glucose, Bld: 90 mg/dL (ref 65–99)
Potassium: 3.7 mmol/L (ref 3.5–5.1)
Sodium: 135 mmol/L (ref 135–145)
Total Protein: 8.3 g/dL — ABNORMAL HIGH (ref 6.5–8.1)

## 2017-05-13 LAB — URINALYSIS, ROUTINE W REFLEX MICROSCOPIC
BILIRUBIN URINE: NEGATIVE
Bacteria, UA: NONE SEEN
Glucose, UA: NEGATIVE mg/dL
Hgb urine dipstick: NEGATIVE
Ketones, ur: 80 mg/dL — AB
Nitrite: NEGATIVE
PH: 6 (ref 5.0–8.0)
Protein, ur: 100 mg/dL — AB
SPECIFIC GRAVITY, URINE: 1.03 (ref 1.005–1.030)

## 2017-05-13 LAB — WET PREP, GENITAL
Clue Cells Wet Prep HPF POC: NONE SEEN
SPERM: NONE SEEN
TRICH WET PREP: NONE SEEN
YEAST WET PREP: NONE SEEN

## 2017-05-13 MED ORDER — METOCLOPRAMIDE HCL 5 MG/ML IJ SOLN: 10.0000 mg | Freq: Once | INTRAMUSCULAR | Status: DC

## 2017-05-13 MED ORDER — LACTATED RINGERS IV BOLUS (SEPSIS): 1000.0000 mL | Freq: Once | INTRAVENOUS | Status: DC

## 2017-05-13 NOTE — MAU Note (Signed)
Pt states she started having vaginal pain when she walks today.  It is relieved with sitting or rest.  She noticed some bright red bleeding today after she went to the bathroom and saw it when she wiped here as well.  Last intercourse was a couple weeks ago.

## 2017-05-13 NOTE — MAU Provider Note (Addendum)
History     CSN: [id number]  Arrival date and time: 05/13/17 1446   First Provider Initiated Contact with Patient 05/13/17 1529      Chief Complaint  Patient presents with  . Vaginal Bleeding   G3P1011 @9 .0 wks here with N/V, vaginal pain, and spotting. Pain and spotting started today. Having dull vaginal pain when she stands or walks. Resolves when she sits down. Rates pain 7/10. Has not used anything for it. She saw some bright red blood on the toilet paper a few hrs ago. No recent IC. N/V has been ongoing. Usually 2 episodes a day. Phenergan not helping. Lost 7 lbs in 1 week.   OB History    Gravida Para Term Preterm AB Living   3 1 1  0 1 1   SAB TAB Ectopic Multiple Live Births   1 0 0 0 1      Past Medical History:  Diagnosis Date  . BV (bacterial vaginosis)   . GERD (gastroesophageal reflux disease)   . Normal pregnancy 09/17/2013    Past Surgical History:  Procedure Laterality Date  . WISDOM TOOTH EXTRACTION      Family History  Problem Relation Age of Onset  . Asthma Sister   . Hearing loss Neg Hx     Social History   Tobacco Use  . Smoking status: Former Research scientist (life sciences)  . Smokeless tobacco: Never Used  . Tobacco comment: quit 2011  Substance Use Topics  . Alcohol use: No  . Drug use: No    Allergies: No Known Allergies  Medications Prior to Admission  Medication Sig Dispense Refill Last Dose  . promethazine (PHENERGAN) 12.5 MG tablet Take 1 tablet (12.5 mg total) by mouth every 6 (six) hours as needed for nausea or vomiting. 30 tablet 4     Review of Systems  Gastrointestinal: Positive for constipation, nausea and vomiting.  Genitourinary: Positive for vaginal bleeding and vaginal pain. Negative for dysuria.   Physical Exam   Blood pressure 132/84, pulse (!) 108, temperature 98.1 F (36.7 C), temperature source Oral, resp. rate 17, weight 136 lb 1.9 oz (61.7 kg), last menstrual period 02/07/2017, SpO2 100 %, unknown if currently  breastfeeding.  Physical Exam  Constitutional: She is oriented to person, place, and time. She appears well-developed and well-nourished. No distress.  HENT:  Head: Normocephalic and atraumatic.  Neck: Normal range of motion.  Respiratory: Effort normal. No respiratory distress.  GI: Soft. She exhibits no distension and no mass. There is no tenderness. There is no rebound and no guarding.  Genitourinary:  Genitourinary Comments: External: no lesions or erythema Vagina: rugated, pink, moist, thin white discharge, no blood Cervix closed/long   Musculoskeletal: Normal range of motion.  Neurological: She is alert and oriented to person, place, and time.  Skin: Skin is warm and dry.  Psychiatric: She has a normal mood and affect.   Limited bedside US: viable, active fetus, +cardiac activity, subj. nml AFV  Results for orders placed or performed during the hospital encounter of 05/13/17 (from the past 24 hour(s))  Urinalysis, Routine w reflex microscopic     Status: Abnormal   Collection Time: 05/13/17  3:05 PM  Result Value Ref Range   Color, Urine AMBER (A) YELLOW   APPearance HAZY (A) CLEAR   Specific Gravity, Urine 1.030 1.005 - 1.030   pH 6.0 5.0 - 8.0   Glucose, UA NEGATIVE NEGATIVE mg/dL   Hgb urine dipstick NEGATIVE NEGATIVE   Bilirubin Urine NEGATIVE NEGATIVE  Ketones, ur 80 (A) NEGATIVE mg/dL   Protein, ur 100 (A) NEGATIVE mg/dL   Nitrite NEGATIVE NEGATIVE   Leukocytes, UA TRACE (A) NEGATIVE   RBC / HPF 0-5 0 - 5 RBC/hpf   WBC, UA 6-30 0 - 5 WBC/hpf   Bacteria, UA NONE SEEN NONE SEEN   Squamous Epithelial / LPF 6-30 (A) NONE SEEN   Mucus PRESENT   Wet prep, genital     Status: Abnormal   Collection Time: 05/13/17  4:10 PM  Result Value Ref Range   Yeast Wet Prep HPF POC NONE SEEN NONE SEEN   Trich, Wet Prep NONE SEEN NONE SEEN   Clue Cells Wet Prep HPF POC NONE SEEN NONE SEEN   WBC, Wet Prep HPF POC MANY (A) NONE SEEN   Sperm NONE SEEN   CBC     Status: Abnormal    Collection Time: 05/13/17  4:26 PM  Result Value Ref Range   WBC 11.0 (H) 4.0 - 10.5 K/uL   RBC 4.81 3.87 - 5.11 MIL/uL   Hemoglobin 12.8 12.0 - 15.0 g/dL   HCT 37.8 36.0 - 46.0 %   MCV 78.6 78.0 - 100.0 fL   MCH 26.6 26.0 - 34.0 pg   MCHC 33.9 30.0 - 36.0 g/dL   RDW 18.1 (H) 11.5 - 15.5 %   Platelets 335 150 - 400 K/uL   MAU Course  Procedures LR and Reglan ordered  MDM Labs ordered and reviewed. Pt cannot stay for IVF and meds d/t childcare issues. Left AMA, may return later.  Assessment and Plan   1. [redacted] weeks gestation of pregnancy   2. Nausea/vomiting in pregnancy   3. Dehydration   4. Spotting affecting pregnancy in first trimester   5. Blood type, Rh positive    Left AMA  Julianne Handler, CNM 05/13/2017, 4:50 PM

## 2017-05-13 NOTE — Progress Notes (Signed)
Informed patient that the provider ordered some IV fluids, lab work, and swabs.  The patient said she had to go home to find childcare for her son and get her car since her ride had other things to do today.  Labs and swabs were collected.  Pt states she will come back as soon as she drops her son off for the IV fluids. She signed the AMA form, put with chart.

## 2017-05-14 LAB — GC/CHLAMYDIA PROBE AMP (~~LOC~~) NOT AT ARMC
Chlamydia: NEGATIVE
NEISSERIA GONORRHEA: NEGATIVE

## 2017-05-16 LAB — OB RESULTS CONSOLE GC/CHLAMYDIA
Chlamydia: NEGATIVE
Gonorrhea: NEGATIVE

## 2017-05-16 LAB — OB RESULTS CONSOLE HIV ANTIBODY (ROUTINE TESTING): HIV: NONREACTIVE

## 2017-05-16 LAB — OB RESULTS CONSOLE ABO/RH: RH TYPE: POSITIVE

## 2017-05-16 LAB — OB RESULTS CONSOLE RUBELLA ANTIBODY, IGM: RUBELLA: IMMUNE

## 2017-05-16 LAB — OB RESULTS CONSOLE RPR: RPR: NONREACTIVE

## 2017-05-16 LAB — OB RESULTS CONSOLE ANTIBODY SCREEN: Antibody Screen: NEGATIVE

## 2017-05-16 LAB — OB RESULTS CONSOLE HEPATITIS B SURFACE ANTIGEN: HEP B S AG: NEGATIVE

## 2017-06-03 ENCOUNTER — Inpatient Hospital Stay (HOSPITAL_COMMUNITY)
Admission: AD | Admit: 2017-06-03 | Discharge: 2017-06-03 | Disposition: A | Discharge status: Home / Self Care | Payer: Medicaid Other | Source: Ambulatory Visit | Attending: Obstetrics and Gynecology | Admitting: Obstetrics and Gynecology

## 2017-06-03 ENCOUNTER — Encounter (HOSPITAL_COMMUNITY): Payer: Self-pay

## 2017-06-03 ENCOUNTER — Other Ambulatory Visit: Payer: Self-pay

## 2017-06-03 DIAGNOSIS — O26891 Other specified pregnancy related conditions, first trimester: Secondary | ICD-10-CM | POA: Insufficient documentation

## 2017-06-03 DIAGNOSIS — Z3A12 12 weeks gestation of pregnancy: Secondary | ICD-10-CM | POA: Diagnosis not present

## 2017-06-03 DIAGNOSIS — Z87891 Personal history of nicotine dependence: Secondary | ICD-10-CM | POA: Insufficient documentation

## 2017-06-03 DIAGNOSIS — R109 Unspecified abdominal pain: Secondary | ICD-10-CM | POA: Diagnosis present

## 2017-06-03 DIAGNOSIS — O210 Mild hyperemesis gravidarum: Secondary | ICD-10-CM | POA: Diagnosis not present

## 2017-06-03 DIAGNOSIS — O9989 Other specified diseases and conditions complicating pregnancy, childbirth and the puerperium: Secondary | ICD-10-CM

## 2017-06-03 DIAGNOSIS — M549 Dorsalgia, unspecified: Secondary | ICD-10-CM | POA: Insufficient documentation

## 2017-06-03 LAB — CBC
HEMATOCRIT: 37.5 % (ref 36.0–46.0)
Hemoglobin: 12.6 g/dL (ref 12.0–15.0)
MCH: 27 pg (ref 26.0–34.0)
MCHC: 33.6 g/dL (ref 30.0–36.0)
MCV: 80.3 fL (ref 78.0–100.0)
Platelets: 246 10*3/uL (ref 150–400)
RBC: 4.67 MIL/uL (ref 3.87–5.11)
RDW: 17.9 % — AB (ref 11.5–15.5)
WBC: 6.6 10*3/uL (ref 4.0–10.5)

## 2017-06-03 LAB — URINALYSIS, ROUTINE W REFLEX MICROSCOPIC
GLUCOSE, UA: NEGATIVE mg/dL
HGB URINE DIPSTICK: NEGATIVE
Ketones, ur: 80 mg/dL — AB
Leukocytes, UA: NEGATIVE
Nitrite: NEGATIVE
PROTEIN: 100 mg/dL — AB
Specific Gravity, Urine: 1.03 (ref 1.005–1.030)
pH: 5 (ref 5.0–8.0)

## 2017-06-03 LAB — COMPREHENSIVE METABOLIC PANEL
ALBUMIN: 3.2 g/dL — AB (ref 3.5–5.0)
ALT: 13 U/L — ABNORMAL LOW (ref 14–54)
ANION GAP: 10 (ref 5–15)
AST: 20 U/L (ref 15–41)
Alkaline Phosphatase: 41 U/L (ref 38–126)
BILIRUBIN TOTAL: 1 mg/dL (ref 0.3–1.2)
BUN: 6 mg/dL (ref 6–20)
CO2: 19 mmol/L — ABNORMAL LOW (ref 22–32)
Calcium: 9.2 mg/dL (ref 8.9–10.3)
Chloride: 106 mmol/L (ref 101–111)
Creatinine, Ser: 0.44 mg/dL (ref 0.44–1.00)
GFR calc Af Amer: >60 mL/min (ref >60)
Glucose, Bld: 89 mg/dL (ref 65–99)
POTASSIUM: 3.8 mmol/L (ref 3.5–5.1)
Sodium: 135 mmol/L (ref 135–145)
TOTAL PROTEIN: 6.6 g/dL (ref 6.5–8.1)

## 2017-06-03 MED ORDER — FAMOTIDINE IN NACL 20-0.9 MG/50ML-% IV SOLN
20.0000 mg | Freq: Once | INTRAVENOUS | Status: AC
  Administered 2017-06-03: 20 mg via INTRAVENOUS
  Filled 2017-06-03: qty 50

## 2017-06-03 MED ORDER — DEXTROSE 5 % IN LACTATED RINGERS IV BOLUS
1000.0000 mL | Freq: Once | INTRAVENOUS | Status: AC
  Administered 2017-06-03: 1000 mL via INTRAVENOUS

## 2017-06-03 MED ORDER — ONDANSETRON 4 MG PO TBDP: 4.0000 mg | ORAL_TABLET | Freq: Four times a day (QID) | ORAL | 2 refills | Status: DC | PRN

## 2017-06-03 MED ORDER — LACTATED RINGERS IV BOLUS
1000.0000 mL | Freq: Once | INTRAVENOUS | Status: AC
  Administered 2017-06-03: 1000 mL via INTRAVENOUS

## 2017-06-03 MED ORDER — ONDANSETRON HCL 4 MG/2ML IJ SOLN
4.0000 mg | Freq: Once | INTRAMUSCULAR | Status: AC
  Administered 2017-06-03: 4 mg via INTRAVENOUS
  Filled 2017-06-03: qty 2

## 2017-06-03 MED ORDER — PROMETHAZINE HCL 25 MG PO TABS: 12.5000 mg | ORAL_TABLET | Freq: Four times a day (QID) | ORAL | 2 refills | Status: DC | PRN

## 2017-06-03 MED ORDER — PROMETHAZINE HCL 25 MG/ML IJ SOLN
12.5000 mg | Freq: Four times a day (QID) | INTRAMUSCULAR | Status: DC | PRN
  Administered 2017-06-03: 12.5 mg via INTRAVENOUS
  Filled 2017-06-03: qty 1

## 2017-06-03 NOTE — MAU Note (Signed)
Pt presents with mid/lower back pain that began this morning that radiates into pelvis causing pelvic pressure. Reports took Tylenol this morning, pain worsened throughout the day.  Pt also c/o vomiting, states unable to keep anything down.  States previously meds for N&V, states doesn't help.  Didn't take any meds today for N&V.

## 2017-06-03 NOTE — MAU Note (Signed)
Patient tolerated a couple of sips of gingerale.

## 2017-06-03 NOTE — MAU Provider Note (Signed)
Chief Complaint: Back Pain; Emesis; and Pelvic Pressure   First Provider Initiated Contact with Patient 06/03/17 1741      SUBJECTIVE HPI: Kimberly Gibson is a 22 y.o. G3P1011 at [redacted]w[redacted]d by LMP who presents to maternity admissions reporting back/abdominal pain and nausea/vomiting. She reports n/v x several weeks, not well managed with medications including Phenergan and Reglan. She has not tried any medications today because she reports they do not work.  She reports vomiting x 2 daily but reports she cannot keep anything down. The n/v started early in pregnancy and the back pain started 2 weeks ago but was worse today at work so she had to leave.  The pain is constant in her low back but intermittent in her low abdomen. There are no other associated symptoms. She denies vaginal bleeding, vaginal itching/burning, urinary symptoms, h/a, dizziness, or fever/chills.     HPI  Past Medical History:  Diagnosis Date  . BV (bacterial vaginosis)   . GERD (gastroesophageal reflux disease)   . Normal pregnancy 09/17/2013   Past Surgical History:  Procedure Laterality Date  . WISDOM TOOTH EXTRACTION     Social History   Socioeconomic History  . Marital status: Single    Spouse name: Not on file  . Number of children: Not on file  . Years of education: Not on file  . Highest education level: Not on file  Occupational History  . Not on file  Social Needs  . Financial resource strain: Not on file  . Food insecurity:    Worry: Not on file    Inability: Not on file  . Transportation needs:    Medical: Not on file    Non-medical: Not on file  Tobacco Use  . Smoking status: Former Research scientist (life sciences)  . Smokeless tobacco: Never Used  . Tobacco comment: quit 2011  Substance and Sexual Activity  . Alcohol use: No  . Drug use: No  . Sexual activity: Yes  Lifestyle  . Physical activity:    Days per week: Not on file    Minutes per session: Not on file  . Stress: Not on file  Relationships  . Social  connections:    Talks on phone: Not on file    Gets together: Not on file    Attends religious service: Not on file    Active member of club or organization: Not on file    Attends meetings of clubs or organizations: Not on file    Relationship status: Not on file  . Intimate partner violence:    Fear of current or ex partner: Not on file    Emotionally abused: Not on file    Physically abused: Not on file    Forced sexual activity: Not on file  Other Topics Concern  . Not on file  Social History Narrative  . Not on file   No current facility-administered medications on file prior to encounter.    Current Outpatient Medications on File Prior to Encounter  Medication Sig Dispense Refill  . metoCLOPramide (REGLAN) 10 MG tablet Take 10 mg by mouth 3 (three) times daily as needed for nausea or vomiting.   2  . Prenatal Vit-Fe Fumarate-FA (PRENATAL MULTIVITAMIN) TABS tablet Take 1 tablet by mouth daily at 12 noon.    . promethazine (PHENERGAN) 12.5 MG tablet Take 1 tablet (12.5 mg total) by mouth every 6 (six) hours as needed for nausea or vomiting. 30 tablet 4   No Known Allergies  ROS:  Review of  Systems  Constitutional: Negative for chills, fatigue and fever.  Respiratory: Negative for shortness of breath.   Cardiovascular: Negative for chest pain.  Gastrointestinal: Positive for abdominal pain, nausea and vomiting.  Genitourinary: Positive for pelvic pain. Negative for difficulty urinating, dysuria, flank pain, vaginal bleeding, vaginal discharge and vaginal pain.  Musculoskeletal: Positive for back pain.  Neurological: Negative for dizziness and headaches.  Psychiatric/Behavioral: Negative.      I have reviewed patient's Past Medical Hx, Surgical Hx, Family Hx, Social Hx, medications and allergies.   Physical Exam   Patient Vitals for the past 24 hrs:  BP Temp Temp src Pulse Resp SpO2 Height Weight  06/03/17 1439 122/75 98 F (36.7 C) Oral (!) 118 18 99 % 5\' 5"  (1.651  m) 130 lb 12 oz (59.3 kg)   Constitutional: Well-developed, well-nourished female in no acute distress.  Cardiovascular: normal rate Respiratory: normal effort GI: Abd soft, non-tender. Pos BS x 4 MS: Extremities nontender, no edema, normal ROM Neurologic: Alert and oriented x 4.  GU: Neg CVAT.  Cervix closed/thick/high on exam  FHT 159 by doppler  LAB RESULTS Results for orders placed or performed during the hospital encounter of 06/03/17 (from the past 24 hour(s))  Urinalysis, Routine w reflex microscopic     Status: Abnormal   Collection Time: 06/03/17  2:51 PM  Result Value Ref Range   Color, Urine AMBER (A) YELLOW   APPearance HAZY (A) CLEAR   Specific Gravity, Urine 1.030 1.005 - 1.030   pH 5.0 5.0 - 8.0   Glucose, UA NEGATIVE NEGATIVE mg/dL   Hgb urine dipstick NEGATIVE NEGATIVE   Bilirubin Urine SMALL (A) NEGATIVE   Ketones, ur 80 (A) NEGATIVE mg/dL   Protein, ur 100 (A) NEGATIVE mg/dL   Nitrite NEGATIVE NEGATIVE   Leukocytes, UA NEGATIVE NEGATIVE   RBC / HPF 0-5 0 - 5 RBC/hpf   WBC, UA 0-5 0 - 5 WBC/hpf   Bacteria, UA RARE (A) NONE SEEN   Squamous Epithelial / LPF 0-5 (A) NONE SEEN   Mucus PRESENT   CBC     Status: Abnormal   Collection Time: 06/03/17  5:04 PM  Result Value Ref Range   WBC 6.6 4.0 - 10.5 K/uL   RBC 4.67 3.87 - 5.11 MIL/uL   Hemoglobin 12.6 12.0 - 15.0 g/dL   HCT 37.5 36.0 - 46.0 %   MCV 80.3 78.0 - 100.0 fL   MCH 27.0 26.0 - 34.0 pg   MCHC 33.6 30.0 - 36.0 g/dL   RDW 17.9 (H) 11.5 - 15.5 %   Platelets 246 150 - 400 K/uL  Comprehensive metabolic panel     Status: Abnormal   Collection Time: 06/03/17  5:04 PM  Result Value Ref Range   Sodium 135 135 - 145 mmol/L   Potassium 3.8 3.5 - 5.1 mmol/L   Chloride 106 101 - 111 mmol/L   CO2 19 (L) 22 - 32 mmol/L   Glucose, Bld 89 65 - 99 mg/dL   BUN 6 6 - 20 mg/dL   Creatinine, Ser 0.44 0.44 - 1.00 mg/dL   Calcium 9.2 8.9 - 10.3 mg/dL   Total Protein 6.6 6.5 - 8.1 g/dL   Albumin 3.2 (L) 3.5 -  5.0 g/dL   AST 20 15 - 41 U/L   ALT 13 (L) 14 - 54 U/L   Alkaline Phosphatase 41 38 - 126 U/L   Total Bilirubin 1.0 0.3 - 1.2 mg/dL   GFR calc non Af Amer >60 >60  mL/min   GFR calc Af Amer >60 >60 mL/min   Anion gap 10 5 - 15    --/--/A POS (10/25 1424)  IMAGING No results found. Limited OB US Date:06/03/17 EDD : 12/16/17  based on first trimester [place] Viability:  FHR 156, CRL measurement c/w previous dates Fetal movement noted on [place] and amniotic fluid subjectively wnl  MAU Management/MDM: CBC, CMP, UA UA with 80 ketones, evidence of dehydration Pt with documented 17 lbs of weight loss since 04/22/17 Cervix closed and bedside US wnl, reassurance provided to pt LR 1000 ml, D5LR x 1000 ml, Phenergan 12.5 mg IV, Pepcid 20 mg IV, and Zofran 4 mg IV given with improved symptoms Pt tolerated PO food/fluids in MAU Pt with hyperemesis gravidarum based on weight loss of >10% of body weight Consult Dr Marvel Plan with assessment and findings.   Add Zofran to pt regimen, 4 mg ODT Q 6 hours.  Pt to take Phenergan at night and Zofran during the day F/U in the office, return to MAU as needed for emergencies Pt discharged with strict return precautions.  ASSESSMENT 1. Hyperemesis gravidarum   2. Back pain affecting pregnancy in second trimester     PLAN Discharge home Allergies as of 06/03/2017   No Known Allergies     Medication List    STOP taking these medications   metoCLOPramide 10 MG tablet Commonly known as:  REGLAN     TAKE these medications   ondansetron 4 MG disintegrating tablet Commonly known as:  ZOFRAN ODT Take 1 tablet (4 mg total) by mouth every 6 (six) hours as needed for nausea.   prenatal multivitamin Tabs tablet Take 1 tablet by mouth daily at 12 noon.   promethazine 25 MG tablet Commonly known as:  PHENERGAN Take 0.5-1 tablets (12.5-25 mg total) by mouth every 6 (six) hours as needed. What changed:    medication strength  how much to take  reasons to  take this      Follow-up Information    Paula Compton, MD Follow up.   Specialty:  Obstetrics and Gynecology Why:  As scheduled, return to MAU as needed for emergencies Contact information: [address] [place] [address] [phone]           Fatima Blank Certified Nurse-Midwife 06/03/2017  7:58 PM

## 2017-08-20 ENCOUNTER — Inpatient Hospital Stay (HOSPITAL_COMMUNITY): Payer: Medicaid Other

## 2017-08-20 ENCOUNTER — Inpatient Hospital Stay (HOSPITAL_COMMUNITY)
Admission: AD | Admit: 2017-08-20 | Discharge: 2017-08-20 | Disposition: A | Discharge status: Home / Self Care | Payer: Medicaid Other | Source: Ambulatory Visit | Attending: Obstetrics and Gynecology | Admitting: Obstetrics and Gynecology

## 2017-08-20 ENCOUNTER — Encounter (HOSPITAL_COMMUNITY): Payer: Self-pay | Admitting: *Deleted

## 2017-08-20 ENCOUNTER — Other Ambulatory Visit: Payer: Self-pay

## 2017-08-20 DIAGNOSIS — Z79899 Other long term (current) drug therapy: Secondary | ICD-10-CM | POA: Diagnosis not present

## 2017-08-20 DIAGNOSIS — Z3A23 23 weeks gestation of pregnancy: Secondary | ICD-10-CM | POA: Diagnosis not present

## 2017-08-20 DIAGNOSIS — O26892 Other specified pregnancy related conditions, second trimester: Secondary | ICD-10-CM | POA: Diagnosis present

## 2017-08-20 DIAGNOSIS — Z87891 Personal history of nicotine dependence: Secondary | ICD-10-CM | POA: Insufficient documentation

## 2017-08-20 DIAGNOSIS — R109 Unspecified abdominal pain: Secondary | ICD-10-CM | POA: Diagnosis not present

## 2017-08-20 DIAGNOSIS — Z825 Family history of asthma and other chronic lower respiratory diseases: Secondary | ICD-10-CM | POA: Insufficient documentation

## 2017-08-20 LAB — URINALYSIS, ROUTINE W REFLEX MICROSCOPIC
Bilirubin Urine: NEGATIVE
GLUCOSE, UA: NEGATIVE mg/dL
HGB URINE DIPSTICK: NEGATIVE
Ketones, ur: NEGATIVE mg/dL
Nitrite: NEGATIVE
Protein, ur: NEGATIVE mg/dL
SPECIFIC GRAVITY, URINE: 1.016 (ref 1.005–1.030)
pH: 7 (ref 5.0–8.0)

## 2017-08-20 NOTE — MAU Note (Signed)
Started having pain in abd yesterday.  abd gets tight, pain comes and goes.  Started at work, continued through  [employer], still happening today, getting worser.  Sharp, stabbing pain- feels like labor. Denies hx of PTL

## 2017-08-20 NOTE — MAU Provider Note (Signed)
History     CSN: [id number]  Arrival date and time: 08/20/17 1339   First Provider Initiated Contact with Patient 08/20/17 1446      Chief Complaint  Patient presents with  . Abdominal Pain   HPI  Kimberly Gibson is a 22 y.o. G3P1011 at [redacted]w[redacted]d who presents with abdominal pain. Symptoms began yesterday. Feels intermittent abdominal pain and abdominal tightening. Yesterday was feeling pain 1-2 times per hour. This morning felt pain every 10 minutes. This afternoon is feeling pain 1-2 times per hour. Rates pain 6/10. Has not treated symptoms. Denies n/v/d, dysuria, vaginal bleeding, or LOF. Positive fetal movement. Has appt in office tomorrow.   OB History    Gravida  3   Para  1   Term  1   Preterm  0   AB  1   Living  1     SAB  1   TAB  0   Ectopic  0   Multiple  0   Live Births  1           Past Medical History:  Diagnosis Date  . BV (bacterial vaginosis)   . GERD (gastroesophageal reflux disease)     Past Surgical History:  Procedure Laterality Date  . WISDOM TOOTH EXTRACTION      Family History  Problem Relation Age of Onset  . Asthma Sister   . Hearing loss Neg Hx     Social History   Tobacco Use  . Smoking status: Former Research scientist (life sciences)  . Smokeless tobacco: Never Used  . Tobacco comment: quit 2011  Substance Use Topics  . Alcohol use: No  . Drug use: No    Allergies: No Known Allergies  Medications Prior to Admission  Medication Sig Dispense Refill Last Dose  . ondansetron (ZOFRAN ODT) 4 MG disintegrating tablet Take 1 tablet (4 mg total) by mouth every 6 (six) hours as needed for nausea. 20 tablet 2 Past Week at Unknown time  . Prenatal Vit-Fe Fumarate-FA (PRENATAL MULTIVITAMIN) TABS tablet Take 1 tablet by mouth daily at 12 noon.   08/19/2017 at Unknown time  . promethazine (PHENERGAN) 25 MG tablet Take 0.5-1 tablets (12.5-25 mg total) by mouth every 6 (six) hours as needed. (Patient not taking: Reported on 08/20/2017) 30 tablet 2 Not Taking at  Unknown time    Review of Systems  Constitutional: Negative.   Gastrointestinal: Positive for abdominal pain. Negative for constipation, diarrhea, nausea and vomiting.  Genitourinary: Negative.    Physical Exam   Blood pressure 102/65, pulse 90, temperature 98.5 F (36.9 C), temperature source Oral, resp. rate 16, weight 135 lb 4 oz (61.3 kg), last menstrual period 02/07/2017, SpO2 97 %, unknown if currently breastfeeding.  Physical Exam  Nursing note and vitals reviewed. Constitutional: She is oriented to person, place, and time. She appears well-developed and well-nourished. No distress.  HENT:  Head: Normocephalic and atraumatic.  Eyes: Conjunctivae are normal. Right eye exhibits no discharge. Left eye exhibits no discharge. No scleral icterus.  Neck: Normal range of motion.  Respiratory: Effort normal and breath sounds normal. No respiratory distress.  GI: Soft. There is no tenderness.  Genitourinary:  Genitourinary Comments: Dilation: Fingertip(external os) Internally closed Exam by:: Jorje Guild, NP   Neurological: She is alert and oriented to person, place, and time.  Skin: Skin is warm and dry. She is not diaphoretic.  Psychiatric: She has a normal mood and affect. Her behavior is normal. Judgment and thought content normal.  MAU Course  Procedures Results for orders placed or performed during the hospital encounter of 08/20/17 (from the past 24 hour(s))  Urinalysis, Routine w reflex microscopic     Status: Abnormal   Collection Time: 08/20/17  2:27 PM  Result Value Ref Range   Color, Urine YELLOW YELLOW   APPearance TURBID (A) CLEAR   Specific Gravity, Urine 1.016 1.005 - 1.030   pH 7.0 5.0 - 8.0   Glucose, UA NEGATIVE NEGATIVE mg/dL   Hgb urine dipstick NEGATIVE NEGATIVE   Bilirubin Urine NEGATIVE NEGATIVE   Ketones, ur NEGATIVE NEGATIVE mg/dL   Protein, ur NEGATIVE NEGATIVE mg/dL   Nitrite NEGATIVE NEGATIVE   Leukocytes, UA SMALL (A) NEGATIVE   WBC, UA  11-20 0 - 5 WBC/hpf   Bacteria, UA RARE (A) NONE SEEN   Squamous Epithelial / LPF 0-5 0 - 5   Mucus PRESENT    Amorphous Crystal PRESENT    No results found.  MDM NST:  Baseline: 135 bpm, Variability: min/mod, Accelerations: Non-reactive but appropriate for gestational age, Decelerations: Absent and no contractions  Fetal tracing appropriate for gestational age. No ctx palpated or seen on toco.   Cervix externally fingertip, internally closed -- feels thick and firm but will get cervical length TVUS -- CL 3.1 cm  U/a with leuks. Pt without urinary complaints. Urine culture pending  C/w Dr. Marvel Plan. Ok to discharge home.   Assessment and Plan  A: 1. [redacted] weeks gestation of pregnancy   2. Abdominal pain during pregnancy, second trimester    P: Discharge home Increase water intake Discussed reasons to return to MAU Keep f/u with OB tomorrow Urine cx pending  Jorje Guild 08/20/2017, 2:46 PM

## 2017-08-20 NOTE — Discharge Instructions (Signed)
Preterm Labor and Birth Information The normal length of a pregnancy is 39-41 weeks. Preterm labor is when labor starts before 37 completed weeks of pregnancy. What are the risk factors for preterm labor? Preterm labor is more likely to occur in women who:  Have certain infections during pregnancy such as a bladder infection, sexually transmitted infection, or infection inside the uterus (chorioamnionitis).  Have a shorter-than-normal cervix.  Have gone into preterm labor before.  Have had surgery on their cervix.  Are younger than age 89 or older than age 19.  Are African American.  Are pregnant with twins or multiple babies (multiple gestation).  Take street drugs or smoke while pregnant.  Do not gain enough weight while pregnant.  Became pregnant shortly after having been pregnant.  What are the symptoms of preterm labor? Symptoms of preterm labor include:  Cramps similar to those that can happen during a menstrual period. The cramps may happen with diarrhea.  Pain in the abdomen or lower back.  Regular uterine contractions that may feel like tightening of the abdomen.  A feeling of increased pressure in the pelvis.  Increased watery or bloody mucus discharge from the vagina.  Water breaking (ruptured amniotic sac).  Why is it important to recognize signs of preterm labor? It is important to recognize signs of preterm labor because babies who are born prematurely may not be fully developed. This can put them at an increased risk for:  Long-term (chronic) heart and lung problems.  Difficulty immediately after birth with regulating body systems, including blood sugar, body temperature, heart rate, and breathing rate.  Bleeding in the brain.  Cerebral palsy.  Learning difficulties.  Death.  These risks are highest for babies who are born before 37 weeks of pregnancy. How is preterm labor treated? Treatment depends on the length of your pregnancy, your  condition, and the health of your baby. It may involve:  Having a stitch (suture) placed in your cervix to prevent your cervix from opening too early (cerclage).  Taking or being given medicines, such as: ? Hormone medicines. These may be given early in pregnancy to help support the pregnancy. ? Medicine to stop contractions. ? Medicines to help mature the babys lungs. These may be prescribed if the risk of delivery is high. ? Medicines to prevent your baby from developing cerebral palsy.  If the labor happens before 34 weeks of pregnancy, you may need to stay in the hospital. What should I do if I think I am in preterm labor? If you think that you are going into preterm labor, call your health care provider right away. How can I prevent preterm labor in future pregnancies? To increase your chance of having a full-term pregnancy:  Do not use any tobacco products, such as cigarettes, chewing tobacco, and e-cigarettes. If you need help quitting, ask your health care provider.  Do not use street drugs or medicines that have not been prescribed to you during your pregnancy.  Talk with your health care provider before taking any herbal supplements, even if you have been taking them regularly.  Make sure you gain a healthy amount of weight during your pregnancy.  Watch for infection. If you think that you might have an infection, get it checked right away.  Make sure to tell your health care provider if you have gone into preterm labor before.  This information is not intended to replace advice given to you by your health care provider. Make sure you discuss any questions  you have with your health care provider. Document Released: 05/06/2003 Document Revised: 07/27/2015 Document Reviewed: 07/07/2015 Elsevier Interactive Patient Education  2018 [employer].

## 2017-08-21 LAB — CULTURE, OB URINE

## 2017-08-28 ENCOUNTER — Other Ambulatory Visit: Payer: Self-pay | Admitting: *Deleted

## 2017-08-28 NOTE — Telephone Encounter (Signed)
Received fax for refill zofran

## 2017-08-29 MED ORDER — ONDANSETRON 4 MG PO TBDP: 4.0000 mg | ORAL_TABLET | Freq: Four times a day (QID) | ORAL | 2 refills | Status: DC | PRN

## 2017-08-29 NOTE — Telephone Encounter (Signed)
Rx renewed for Zofran 4 mg tabs Q 6 hours PRN at pt request

## 2017-09-16 ENCOUNTER — Other Ambulatory Visit: Payer: Self-pay

## 2017-09-16 ENCOUNTER — Inpatient Hospital Stay (HOSPITAL_COMMUNITY)
Admission: AD | Admit: 2017-09-16 | Discharge: 2017-09-16 | Disposition: A | Discharge status: Home / Self Care | Payer: Medicaid Other | Source: Ambulatory Visit | Attending: Obstetrics and Gynecology | Admitting: Obstetrics and Gynecology

## 2017-09-16 ENCOUNTER — Encounter (HOSPITAL_COMMUNITY): Payer: Self-pay | Admitting: *Deleted

## 2017-09-16 DIAGNOSIS — O219 Vomiting of pregnancy, unspecified: Secondary | ICD-10-CM

## 2017-09-16 DIAGNOSIS — O212 Late vomiting of pregnancy: Secondary | ICD-10-CM | POA: Diagnosis not present

## 2017-09-16 DIAGNOSIS — O2342 Unspecified infection of urinary tract in pregnancy, second trimester: Secondary | ICD-10-CM | POA: Diagnosis not present

## 2017-09-16 DIAGNOSIS — Z3A27 27 weeks gestation of pregnancy: Secondary | ICD-10-CM

## 2017-09-16 DIAGNOSIS — Z87891 Personal history of nicotine dependence: Secondary | ICD-10-CM | POA: Insufficient documentation

## 2017-09-16 LAB — URINALYSIS, ROUTINE W REFLEX MICROSCOPIC
BILIRUBIN URINE: NEGATIVE
Bacteria, UA: NONE SEEN
GLUCOSE, UA: NEGATIVE mg/dL
Hgb urine dipstick: NEGATIVE
KETONES UR: NEGATIVE mg/dL
Nitrite: NEGATIVE
PH: 8 (ref 5.0–8.0)
PROTEIN: 30 mg/dL — AB
Specific Gravity, Urine: 1.017 (ref 1.005–1.030)

## 2017-09-16 MED ORDER — ONDANSETRON 4 MG PO TBDP
4.0000 mg | ORAL_TABLET | Freq: Once | ORAL | Status: AC
  Administered 2017-09-16: 4 mg via ORAL
  Filled 2017-09-16: qty 1

## 2017-09-16 MED ORDER — DEXTROSE IN LACTATED RINGERS 5 % IV SOLN
INTRAVENOUS | Status: DC
  Administered 2017-09-16: 09:00:00 via INTRAVENOUS

## 2017-09-16 MED ORDER — CEPHALEXIN 500 MG PO CAPS: 500.0000 mg | ORAL_CAPSULE | Freq: Four times a day (QID) | ORAL | 0 refills | Status: AC

## 2017-09-16 NOTE — MAU Provider Note (Signed)
History     CSN: [id number]  Arrival date and time: 09/16/17 0754   None     Chief Complaint  Patient presents with  . Abdominal Pain  . Emesis   HPI  Kimberly Gibson is a 22 y.o. G3P1011 at [redacted]w[redacted]d who presents to MAU with chief complaint of nausea/vomiting after eating undercooked chicken yesterday. Reports she started vomiting and experiencing upper abdominal stomach cramping last night at approximately 11:00pm. Cramping is mild, 2/10. upper abdominal, bilateral, and only occurs while she is vomiting. Does not radiate, aggravated by vomiting, alleviated by end of vomiting. Confirms she saw pieces of chicken in her emesis during initial episodes of vomiting. Has Zofran at home left over from nausea/vomiting in first trimester but has not taken this or any other medication.  Denies vaginal bleeding, leaking of fluid, decreased fetal movement, fever, falls, or recent illness.    OB History    Gravida  3   Para  1   Term  1   Preterm  0   AB  1   Living  1     SAB  1   TAB  0   Ectopic  0   Multiple  0   Live Births  1           Past Medical History:  Diagnosis Date  . BV (bacterial vaginosis)   . GERD (gastroesophageal reflux disease)     Past Surgical History:  Procedure Laterality Date  . WISDOM TOOTH EXTRACTION      Family History  Problem Relation Age of Onset  . Asthma Sister   . Hearing loss Neg Hx     Social History   Tobacco Use  . Smoking status: Former Research scientist (life sciences)  . Smokeless tobacco: Never Used  . Tobacco comment: quit 2011  Substance Use Topics  . Alcohol use: No  . Drug use: No    Allergies: No Known Allergies  Medications Prior to Admission  Medication Sig Dispense Refill Last Dose  . ondansetron (ZOFRAN ODT) 4 MG disintegrating tablet Take 1 tablet (4 mg total) by mouth every 6 (six) hours as needed for nausea. 20 tablet 2   . Prenatal Vit-Fe Fumarate-FA (PRENATAL MULTIVITAMIN) TABS tablet Take 1 tablet by mouth daily at 12 noon.    08/19/2017 at Unknown time    Review of Systems  Constitutional: Positive for fatigue. Negative for chills and fever.  Gastrointestinal: Positive for nausea and vomiting.  Genitourinary: Negative for vaginal bleeding, vaginal discharge and vaginal pain.  Musculoskeletal:       Reports bilateral upper abdominal cramping during vomiting episodes  Neurological: Negative for dizziness, light-headedness and headaches.  All other systems reviewed and are negative.  Physical Exam   Weight 136 lb 12 oz (62 kg), last menstrual period 02/07/2017, unknown if currently breastfeeding.  Physical Exam  Nursing note and vitals reviewed. Constitutional: She is oriented to person, place, and time. She appears well-developed and well-nourished.  Cardiovascular: Normal rate, regular rhythm, normal heart sounds and intact distal pulses.  Respiratory: Effort normal and breath sounds normal.  GI: There is no tenderness. There is no rebound and no guarding.  Gravid  Genitourinary:  Genitourinary Comments: Not assessed based on chief complaint  Neurological: She is alert and oriented to person, place, and time. She has normal reflexes.  Skin: Skin is warm and dry.  Psychiatric: She has a normal mood and affect. Her behavior is normal. Judgment and thought content normal.    MAU Course  Procedures N/A  MDM  Nausea vomiting triggered by ingestion of undercooked chicken Afebrile, no diarrhea, hemodynamically stable Reactive NST: baseline 130, moderate variability, positive 10 x10 accelerations, no decelerations Toco: uterine irritability noted N/V managed with Zofran ODT given in MAU and IV fluids  Patient Vitals for the past 24 hrs:  BP Temp Temp src Pulse Resp SpO2 Weight  09/16/17 0812 117/72 97.7 F (36.5 C) Oral 93 16 98 % -  09/16/17 0807 - - - - - - 136 lb 12 oz (62 kg)    Orders Placed This Encounter  Procedures  . Culture, OB Urine  . Urinalysis, Routine w reflex microscopic  .  Insert peripheral IV  . Discharge patient   Results for orders placed or performed during the hospital encounter of 09/16/17 (from the past 24 hour(s))  Urinalysis, Routine w reflex microscopic     Status: Abnormal   Collection Time: 09/16/17  8:10 AM  Result Value Ref Range   Color, Urine AMBER (A) YELLOW   APPearance TURBID (A) CLEAR   Specific Gravity, Urine 1.017 1.005 - 1.030   pH 8.0 5.0 - 8.0   Glucose, UA NEGATIVE NEGATIVE mg/dL   Hgb urine dipstick NEGATIVE NEGATIVE   Bilirubin Urine NEGATIVE NEGATIVE   Ketones, ur NEGATIVE NEGATIVE mg/dL   Protein, ur 30 (A) NEGATIVE mg/dL   Nitrite NEGATIVE NEGATIVE   Leukocytes, UA MODERATE (A) NEGATIVE   RBC / HPF 0-5 0 - 5 RBC/hpf   WBC, UA 21-50 0 - 5 WBC/hpf   Bacteria, UA NONE SEEN NONE SEEN   Squamous Epithelial / LPF 11-20 0 - 5   Mucus PRESENT    Meds ordered this encounter  Medications  . ondansetron (ZOFRAN-ODT) disintegrating tablet 4 mg  . dextrose 5 % in lactated ringers infusion  . cephALEXin (KEFLEX) 500 MG capsule    Sig: Take 1 capsule (500 mg total) by mouth 4 (four) times daily for 10 days.    Dispense:  40 capsule    Refill:  0    Order Specific Question:   Supervising Provider    Answer:   Donnamae Jude [[id number]]   Assessment and Plan  --22 y.o. G3P1011 at [redacted]w[redacted]d  --Reactive NST --Nausea/vomiting in pregnancy after ingestion of poorly cooked chicken --UTI in pregnancy, rx for Keflex to patient's preferred pharmacy, urine culture ordered --Reviewed general obstetric precautions including but not limited to falls, fever, vaginal bleeding, leaking of fluid, decreased fetal movement, headache not relieved by Tylenol, rest and PO hydration.  --Discharge home in stable condition  Presentation, clinical findings, and plan discussed with Dr. Marvel Plan.    Darlina Rumpf, CNM 09/16/2017, 9:30 AM

## 2017-09-16 NOTE — MAU Note (Addendum)
Ate undercooked chicken yesterday.  Started throwing up at 2300. Is throwing up a lot. Cramping.  Denies diarrhea.  No one else is sick, they ate something different.

## 2017-09-16 NOTE — Discharge Instructions (Signed)
Nausea/vomiting in Pregnancy Morning sickness is when you feel sick to your stomach (nauseous) during pregnancy. This nauseous feeling may or may not come with vomiting. It often occurs in the morning but can be a problem any time of day. Morning sickness is most common during the first trimester, but it may continue throughout pregnancy. While morning sickness is unpleasant, it is usually harmless unless you develop severe and continual vomiting (hyperemesis gravidarum). This condition requires more intense treatment. What are the causes? The cause of morning sickness is not completely known but seems to be related to normal hormonal changes that occur in pregnancy. What increases the risk? You are at greater risk if you:  Experienced nausea or vomiting before your pregnancy.  Had morning sickness during a previous pregnancy.  Are pregnant with more than one baby, such as twins.  How is this treated? Do not use any medicines (prescription, over-the-counter, or herbal) for morning sickness without first talking to your health care provider. Your health care provider may prescribe or recommend:  Vitamin B6 supplements.  Anti-nausea medicines.  The herbal medicine ginger.  Follow these instructions at home:  Only take over-the-counter or prescription medicines as directed by your health care provider.  Taking multivitamins before getting pregnant can prevent or decrease the severity of morning sickness in most women.  Eat a piece of dry toast or unsalted crackers before getting out of bed in the morning.  Eat five or six small meals a day.  Eat dry and bland foods (rice, baked potato). Foods high in carbohydrates are often helpful.  Do not drink liquids with your meals. Drink liquids between meals.  Avoid greasy, fatty, and spicy foods.  Get someone to cook for you if the smell of any food causes nausea and vomiting.  If you feel nauseous after taking prenatal vitamins, take the  vitamins at night or with a snack.  Snack on protein foods (nuts, yogurt, cheese) between meals if you are hungry.  Eat unsweetened gelatins for desserts.  Wearing an acupressure wristband (worn for sea sickness) may be helpful.  Acupuncture may be helpful.  Do not smoke.  Get a humidifier to keep the air in your house free of odors.  Get plenty of fresh air. Contact a health care provider if:  Your home remedies are not working, and you need medicine.  You feel dizzy or lightheaded.  You are losing weight. Get help right away if:  You have persistent and uncontrolled nausea and vomiting.  You pass out (faint). This information is not intended to replace advice given to you by your health care provider. Make sure you discuss any questions you have with your health care provider. Document Released: 04/06/2006 Document Revised: 07/22/2015 Document Reviewed: 07/31/2012 Elsevier Interactive Patient Education  2017 [employer].

## 2017-09-17 LAB — CULTURE, OB URINE: Culture: NO GROWTH

## 2017-10-13 ENCOUNTER — Inpatient Hospital Stay (HOSPITAL_COMMUNITY)
Admission: AD | Admit: 2017-10-13 | Discharge: 2017-10-13 | Disposition: A | Discharge status: Home / Self Care | Payer: Medicaid Other | Source: Ambulatory Visit | Attending: Obstetrics and Gynecology | Admitting: Obstetrics and Gynecology

## 2017-10-13 ENCOUNTER — Encounter (HOSPITAL_COMMUNITY): Payer: Self-pay | Admitting: *Deleted

## 2017-10-13 ENCOUNTER — Inpatient Hospital Stay (HOSPITAL_BASED_OUTPATIENT_CLINIC_OR_DEPARTMENT_OTHER): Payer: Medicaid Other

## 2017-10-13 DIAGNOSIS — R109 Unspecified abdominal pain: Secondary | ICD-10-CM | POA: Diagnosis present

## 2017-10-13 DIAGNOSIS — Z87891 Personal history of nicotine dependence: Secondary | ICD-10-CM | POA: Diagnosis not present

## 2017-10-13 DIAGNOSIS — Z3A30 30 weeks gestation of pregnancy: Secondary | ICD-10-CM | POA: Diagnosis not present

## 2017-10-13 DIAGNOSIS — O289 Unspecified abnormal findings on antenatal screening of mother: Secondary | ICD-10-CM | POA: Diagnosis not present

## 2017-10-13 DIAGNOSIS — O9989 Other specified diseases and conditions complicating pregnancy, childbirth and the puerperium: Secondary | ICD-10-CM | POA: Diagnosis not present

## 2017-10-13 DIAGNOSIS — O26893 Other specified pregnancy related conditions, third trimester: Secondary | ICD-10-CM

## 2017-10-13 DIAGNOSIS — O288 Other abnormal findings on antenatal screening of mother: Secondary | ICD-10-CM

## 2017-10-13 LAB — URINALYSIS, ROUTINE W REFLEX MICROSCOPIC
Bacteria, UA: NONE SEEN
Bilirubin Urine: NEGATIVE
GLUCOSE, UA: 150 mg/dL — AB
Hgb urine dipstick: NEGATIVE
Ketones, ur: 5 mg/dL — AB
Nitrite: NEGATIVE
Protein, ur: NEGATIVE mg/dL
SPECIFIC GRAVITY, URINE: 1.02 (ref 1.005–1.030)
pH: 6 (ref 5.0–8.0)

## 2017-10-13 NOTE — Discharge Instructions (Signed)

## 2017-10-13 NOTE — MAU Provider Note (Signed)
History   G3 P1-0-1-1 at 30 weeks and 6 days in with complaints of abdominal pain while she was at work. she states the pain started this morning.  She denies rupture membranes or vaginal bleeding.  Her last pregnancy went to full-term and she was induced.  CSN: [id number]  Arrival date & time 10/13/17  1527   First Provider Initiated Contact with Patient 10/13/17 1553      Chief Complaint  Patient presents with  . Abdominal Pain  . Back Pain    HPI  Past Medical History:  Diagnosis Date  . BV (bacterial vaginosis)   . GERD (gastroesophageal reflux disease)     Past Surgical History:  Procedure Laterality Date  . WISDOM TOOTH EXTRACTION      Family History  Problem Relation Age of Onset  . Asthma Sister   . Hearing loss Neg Hx     Social History   Tobacco Use  . Smoking status: Former Research scientist (life sciences)  . Smokeless tobacco: Never Used  . Tobacco comment: quit 2011  Substance Use Topics  . Alcohol use: No  . Drug use: No    OB History    Gravida  3   Para  1   Term  1   Preterm  0   AB  1   Living  1     SAB  1   TAB  0   Ectopic  0   Multiple  0   Live Births  1           Review of Systems  Constitutional: Negative.   HENT: Negative.   Eyes: Negative.   Respiratory: Negative.   Cardiovascular: Negative.   Gastrointestinal: Positive for abdominal pain.  Endocrine: Negative.   Genitourinary: Negative.   Musculoskeletal: Negative.   Skin: Negative.   Allergic/Immunologic: Negative.   Neurological: Negative.   Hematological: Negative.   Psychiatric/Behavioral: Negative.     Allergies  Patient has no known allergies.  Home Medications    BP 115/69 (BP Location: Right Arm)   Pulse (!) 108   Temp 98.2 F (36.8 C) (Oral)   Resp 17   Wt 64.4 kg   LMP 02/07/2017   SpO2 97% Comment: ra  BMI 23.64 kg/m   Physical Exam  Constitutional: She is oriented to person, place, and time. She appears well-developed and well-nourished.  HENT:   Head: Normocephalic.  Cardiovascular: Normal rate, regular rhythm, normal heart sounds and intact distal pulses.  Pulmonary/Chest: Effort normal and breath sounds normal.  Abdominal: Soft. Normal appearance and bowel sounds are normal.  Genitourinary: Vagina normal and uterus normal.  Neurological: She is alert and oriented to person, place, and time.  Skin: Skin is warm and dry.  Psychiatric: She has a normal mood and affect. Her behavior is normal.    MAU Course  Procedures (including critical care time)  Labs Reviewed  URINALYSIS, ROUTINE W REFLEX MICROSCOPIC   No results found.   1. Abdominal pain during pregnancy in third trimester       MDM  VSS, SVE ft/th/post/high. FHR 130's mod variability no decels, no accels. BPP 8/8. No uterine contractions. Dr. Willis Modena notified and POC discussed. To d/c pt home

## 2017-10-13 NOTE — MAU Note (Signed)
VERTA RIEDLINGER is a 22 y.o. at [redacted]w[redacted]d here in MAU reporting:  +lower abdominal and back pain Constant Cramping in nature Onset of complaint: x2 days  +mucus like discharge, pink tinged Endorses she may be working to hard....  Pain score: 7/10 Vitals:   10/13/17 1544  BP: 115/69  Pulse: (!) 108  Resp: 17  Temp: 98.2 F (36.8 C)  SpO2: 97%    Lab orders placed from triage: ua

## 2017-10-13 NOTE — MAU Note (Signed)
Urine in lab 

## 2017-10-14 NOTE — Progress Notes (Signed)
FHT from 8-17 reviewed.  Reactive NST for 30 weeks with 10x10 accels, no significant decels or ctx.

## 2017-11-19 LAB — OB RESULTS CONSOLE GBS: GBS: NEGATIVE

## 2017-11-21 ENCOUNTER — Encounter (HOSPITAL_COMMUNITY): Payer: Self-pay

## 2017-11-21 ENCOUNTER — Other Ambulatory Visit: Payer: Self-pay

## 2017-11-21 ENCOUNTER — Inpatient Hospital Stay (HOSPITAL_COMMUNITY)
Admission: AD | Admit: 2017-11-21 | Discharge: 2017-11-21 | Disposition: A | Discharge status: Home / Self Care | Payer: Medicaid Other | Source: Ambulatory Visit | Attending: Obstetrics and Gynecology | Admitting: Obstetrics and Gynecology

## 2017-11-21 DIAGNOSIS — O212 Late vomiting of pregnancy: Secondary | ICD-10-CM | POA: Diagnosis present

## 2017-11-21 DIAGNOSIS — O26893 Other specified pregnancy related conditions, third trimester: Secondary | ICD-10-CM

## 2017-11-21 DIAGNOSIS — O219 Vomiting of pregnancy, unspecified: Secondary | ICD-10-CM

## 2017-11-21 DIAGNOSIS — Z87891 Personal history of nicotine dependence: Secondary | ICD-10-CM | POA: Insufficient documentation

## 2017-11-21 DIAGNOSIS — Z3A36 36 weeks gestation of pregnancy: Secondary | ICD-10-CM | POA: Diagnosis not present

## 2017-11-21 DIAGNOSIS — R51 Headache: Secondary | ICD-10-CM

## 2017-11-21 LAB — URINALYSIS, ROUTINE W REFLEX MICROSCOPIC
Bilirubin Urine: NEGATIVE
Glucose, UA: NEGATIVE mg/dL
Hgb urine dipstick: NEGATIVE
Ketones, ur: NEGATIVE mg/dL
Nitrite: NEGATIVE
Protein, ur: NEGATIVE mg/dL
Specific Gravity, Urine: 1.003 — ABNORMAL LOW (ref 1.005–1.030)
pH: 7 (ref 5.0–8.0)

## 2017-11-21 MED ORDER — ONDANSETRON 8 MG PO TBDP: 8.0000 mg | ORAL_TABLET | Freq: Three times a day (TID) | ORAL | 0 refills | Status: DC | PRN

## 2017-11-21 MED ORDER — ACETAMINOPHEN 500 MG PO TABS
1000.0000 mg | ORAL_TABLET | Freq: Once | ORAL | Status: AC
  Administered 2017-11-21: 1000 mg via ORAL
  Filled 2017-11-21: qty 2

## 2017-11-21 MED ORDER — ONDANSETRON 8 MG PO TBDP
8.0000 mg | ORAL_TABLET | Freq: Once | ORAL | Status: AC
  Administered 2017-11-21: 8 mg via ORAL
  Filled 2017-11-21: qty 1

## 2017-11-21 NOTE — MAU Provider Note (Signed)
History     CSN: [id number]  Arrival date and time: 11/21/17 1122   First Provider Initiated Contact with Patient 11/21/17 1211      Chief Complaint  Patient presents with  . Emesis   HPI   [name] is a 22 y.o. female G49P1011 @[redacted]w[redacted]d  here in MAU with complaints of vomiting.  The vomiting started last night around 11 pm. She has had 3 episodes of vomiting. Says she tried to eat some eggs this morning and was not able to keep it down. No diarrhea. No fever, no sick contacts. + headache. Has not tried any nausea medication for the symptoms.   OB History    Gravida  3   Para  1   Term  1   Preterm  0   AB  1   Living  1     SAB  1   TAB  0   Ectopic  0   Multiple  0   Live Births  1           Past Medical History:  Diagnosis Date  . BV (bacterial vaginosis)   . GERD (gastroesophageal reflux disease)     Past Surgical History:  Procedure Laterality Date  . WISDOM TOOTH EXTRACTION      Family History  Problem Relation Age of Onset  . Asthma Sister   . Hearing loss Neg Hx     Social History   Tobacco Use  . Smoking status: Former Research scientist (life sciences)  . Smokeless tobacco: Never Used  . Tobacco comment: quit 2011  Substance Use Topics  . Alcohol use: No  . Drug use: No    Allergies: No Known Allergies  Medications Prior to Admission  Medication Sig Dispense Refill Last Dose  . Prenatal Vit-Fe Fumarate-FA (PRENATAL MULTIVITAMIN) TABS tablet Take 1 tablet by mouth daily at 12 noon.   Past Week at Unknown time  . ondansetron (ZOFRAN ODT) 4 MG disintegrating tablet Take 1 tablet (4 mg total) by mouth every 6 (six) hours as needed for nausea. 20 tablet 2 Unknown at Unknown time   Results for orders placed or performed during the hospital encounter of 11/21/17 (from the past 48 hour(s))  Urinalysis, Routine w reflex microscopic     Status: Abnormal   Collection Time: 11/21/17 11:50 AM  Result Value Ref Range   Color, Urine STRAW (A) YELLOW    APPearance CLEAR CLEAR   Specific Gravity, Urine 1.003 (L) 1.005 - 1.030   pH 7.0 5.0 - 8.0   Glucose, UA NEGATIVE NEGATIVE mg/dL   Hgb urine dipstick NEGATIVE NEGATIVE   Bilirubin Urine NEGATIVE NEGATIVE   Ketones, ur NEGATIVE NEGATIVE mg/dL   Protein, ur NEGATIVE NEGATIVE mg/dL   Nitrite NEGATIVE NEGATIVE   Leukocytes, UA TRACE (A) NEGATIVE   RBC / HPF 0-5 0 - 5 RBC/hpf   WBC, UA 0-5 0 - 5 WBC/hpf   Bacteria, UA RARE (A) NONE SEEN   Squamous Epithelial / LPF 0-5 0 - 5   Mucus PRESENT     Comment: Performed at [facility], [address], [place], [place] [address]   Review of Systems  Constitutional: Negative for fever.  Gastrointestinal: Positive for nausea and vomiting. Negative for diarrhea.  Genitourinary: Negative for dysuria.  Neurological: Positive for headaches. Negative for dizziness.   Physical Exam   Blood pressure 115/68, pulse 93, temperature 98.2 F (36.8 C), temperature source Oral, resp. rate 18, height 5\' 5"  (1.651 m), weight 66.7 kg, last  menstrual period 02/07/2017, SpO2 100 %, unknown if currently breastfeeding.  Physical Exam  Constitutional: She is oriented to person, place, and time. She appears well-developed and well-nourished. No distress.  HENT:  Head: Normocephalic.  Eyes: Pupils are equal, round, and reactive to light.  Musculoskeletal: Normal range of motion.  Neurological: She is alert and oriented to person, place, and time.  Skin: Skin is warm. She is not diaphoretic.  Psychiatric: Her behavior is normal.   Fetal Tracing: Baseline: 120 bpm Variability: moderate  Accelerations: 15x15 Decelerations: None Toco: UI  MAU Course  Procedures  None  MDM  Zofran 8 mg ODT Tylenol 1 gram PO Discussed patient with Dr. Terri Piedra; discussed labs and HPI, ok for DC home Patient feeling much better and feels ready to go home.   Assessment and Plan   A:  1. Nausea and vomiting in pregnancy   2. Pregnancy headache in third trimester      P:  Discharge home in stable condition Rx: Zofran Return to MAU if symptoms worsen Small, frequent meals  BRAT diet  Quayshawn Nin, Artist Pais, NP 11/21/2017 6:37 PM

## 2017-11-21 NOTE — MAU Note (Signed)
Pt. States nausea started around 11:30p yesterday.  Pt reports vomiting X3 in last 24hrs. Pt states she has had RX for zofran during this pregnancy but has not taken it in a while.   Pt denies vag bleeding, or LOF.  Pt. Reports good fetal movement.

## 2017-11-21 NOTE — Discharge Instructions (Signed)

## 2017-11-25 ENCOUNTER — Inpatient Hospital Stay (HOSPITAL_COMMUNITY)
Admission: AD | Admit: 2017-11-25 | Discharge: 2017-11-25 | Disposition: A | Discharge status: Home / Self Care | Payer: Medicaid Other | Source: Ambulatory Visit | Attending: Obstetrics and Gynecology | Admitting: Obstetrics and Gynecology

## 2017-11-25 ENCOUNTER — Encounter (HOSPITAL_COMMUNITY): Payer: Self-pay | Admitting: *Deleted

## 2017-11-25 DIAGNOSIS — O471 False labor at or after 37 completed weeks of gestation: Secondary | ICD-10-CM

## 2017-11-25 DIAGNOSIS — Z3A39 39 weeks gestation of pregnancy: Secondary | ICD-10-CM | POA: Diagnosis not present

## 2017-11-25 LAB — POCT FERN TEST: POCT FERN TEST: NEGATIVE

## 2017-11-25 NOTE — MAU Note (Signed)
Rn clarified green gush was mucous  In characteristic versus watery discharge. Fern negative. Discussed with Wyatt Mage CNM

## 2017-11-25 NOTE — Discharge Instructions (Signed)
Braxton Hicks Contractions °Contractions of the uterus can occur throughout pregnancy, but they are not always a sign that you are in labor. You may have practice contractions called Braxton Hicks contractions. These false labor contractions are sometimes confused with true labor. °What are Braxton Hicks contractions? °Braxton Hicks contractions are tightening movements that occur in the muscles of the uterus before labor. Unlike true labor contractions, these contractions do not result in opening (dilation) and thinning of the cervix. Toward the end of pregnancy (32-34 weeks), Braxton Hicks contractions can happen more often and may become stronger. These contractions are sometimes difficult to tell apart from true labor because they can be very uncomfortable. You should not feel embarrassed if you go to the hospital with false labor. °Sometimes, the only way to tell if you are in true labor is for your health care provider to look for changes in the cervix. The health care provider will do a physical exam and may monitor your contractions. If you are not in true labor, the exam should show that your cervix is not dilating and your water has not broken. °If there are other health problems associated with your pregnancy, it is completely safe for you to be sent home with false labor. You may continue to have Braxton Hicks contractions until you go into true labor. °How to tell the difference between true labor and false labor °True labor °· Contractions last 30-70 seconds. °· Contractions become very regular. °· Discomfort is usually felt in the top of the uterus, and it spreads to the lower abdomen and low back. °· Contractions do not go away with walking. °· Contractions usually become more intense and increase in frequency. °· The cervix dilates and gets thinner. °False labor °· Contractions are usually shorter and not as strong as true labor contractions. °· Contractions are usually irregular. °· Contractions  are often felt in the front of the lower abdomen and in the groin. °· Contractions may go away when you walk around or change positions while lying down. °· Contractions get weaker and are shorter-lasting as time goes on. °· The cervix usually does not dilate or become thin. °Follow these instructions at home: °· Take over-the-counter and prescription medicines only as told by your health care provider. °· Keep up with your usual exercises and follow other instructions from your health care provider. °· Eat and drink lightly if you think you are going into labor. °· If Braxton Hicks contractions are making you uncomfortable: °? Change your position from lying down or resting to walking, or change from walking to resting. °? Sit and rest in a tub of warm water. °? Drink enough fluid to keep your urine pale yellow. Dehydration may cause these contractions. °? Do slow and deep breathing several times an hour. °· Keep all follow-up prenatal visits as told by your health care provider. This is important. °Contact a health care provider if: °· You have a fever. °· You have continuous pain in your abdomen. °Get help right away if: °· Your contractions become stronger, more regular, and closer together. °· You have fluid leaking or gushing from your vagina. °· You pass blood-tinged mucus (bloody show). °· You have bleeding from your vagina. °· You have low back pain that you never had before. °· You feel your baby’s head pushing down and causing pelvic pressure. °· Your baby is not moving inside you as much as it used to. °Summary °· Contractions that occur before labor are called Braxton   Hicks contractions, false labor, or practice contractions. °· Braxton Hicks contractions are usually shorter, weaker, farther apart, and less regular than true labor contractions. True labor contractions usually become progressively stronger and regular and they become more frequent. °· Manage discomfort from Braxton Hicks contractions by  changing position, resting in a warm bath, drinking plenty of water, or practicing deep breathing. °This information is not intended to replace advice given to you by your health care provider. Make sure you discuss any questions you have with your health care provider. °Document Released: 06/29/2016 Document Revised: 06/29/2016 Document Reviewed: 06/29/2016 °Elsevier Interactive Patient Education © 2018 [employer] ° °Fetal Movement Counts °Patient Name: ________________________________________________ Patient Due Date: ____________________ °What is a fetal movement count? °A fetal movement count is the number of times that you feel your baby move during a certain amount of time. This may also be called a fetal kick count. A fetal movement count is recommended for every pregnant woman. You may be asked to start counting fetal movements as early as week 28 of your pregnancy. °Pay attention to when your baby is most active. You may notice your baby's sleep and wake cycles. You may also notice things that make your baby move more. You should do a fetal movement count: °· When your baby is normally most active. °· At the same time each day. ° °A good time to count movements is while you are resting, after having something to eat and drink. °How do I count fetal movements? °1. Find a quiet, comfortable area. Sit, or lie down on your side. °2. Write down the date, the start time and stop time, and the number of movements that you felt between those two times. Take this information with you to your health care visits. °3. For 2 hours, count kicks, flutters, swishes, rolls, and jabs. You should feel at least 10 movements during 2 hours. °4. You may stop counting after you have felt 10 movements. °5. If you do not feel 10 movements in 2 hours, have something to eat and drink. Then, keep resting and counting for 1 hour. If you feel at least 4 movements during that hour, you may stop counting. °Contact a health care  provider if: °· You feel fewer than 4 movements in 2 hours. °· Your baby is not moving like he or she usually does. °Date: ____________ Start time: ____________ Stop time: ____________ Movements: ____________ °Date: ____________ Start time: ____________ Stop time: ____________ Movements: ____________ °Date: ____________ Start time: ____________ Stop time: ____________ Movements: ____________ °Date: ____________ Start time: ____________ Stop time: ____________ Movements: ____________ °Date: ____________ Start time: ____________ Stop time: ____________ Movements: ____________ °Date: ____________ Start time: ____________ Stop time: ____________ Movements: ____________ °Date: ____________ Start time: ____________ Stop time: ____________ Movements: ____________ °Date: ____________ Start time: ____________ Stop time: ____________ Movements: ____________ °Date: ____________ Start time: ____________ Stop time: ____________ Movements: ____________ °This information is not intended to replace advice given to you by your health care provider. Make sure you discuss any questions you have with your health care provider. °Document Released: 03/15/2006 Document Revised: 10/13/2015 Document Reviewed: 03/25/2015 °Elsevier Interactive Patient Education © 2018 [employer] ° °

## 2017-11-25 NOTE — MAU Note (Signed)
Pt was discharged around 1200 and returned via EMS for worsening contractions denies LOF/VB.

## 2017-11-25 NOTE — Progress Notes (Signed)
I have communicated with Dr. Willis Modena and reviewed vital signs:  Vitals:   11/25/17 1700 11/25/17 1956  BP: 114/63 118/70  Pulse: (!) 108 94  Resp: 16   Temp: 98.3 F (36.8 C)     Vaginal exam:  Dilation: 4.5 Effacement (%): 70 Cervical Position: Middle Station: -2 Presentation: Vertex Exam by:: A. Tyran Huser, RN,   Also reviewed contraction pattern and that non-stress test is reactive.  It has been documented that patient is contracting irregularly with minimal cervical change over 2.5 hours not indicating active labor.  Patient denies any other complaints.  Based on this report provider has given order for discharge.  A discharge order and diagnosis entered by a provider.   Labor discharge instructions reviewed with patient.

## 2017-11-25 NOTE — Discharge Instructions (Signed)
Braxton Hicks Contractions °Contractions of the uterus can occur throughout pregnancy, but they are not always a sign that you are in labor. You may have practice contractions called Braxton Hicks contractions. These false labor contractions are sometimes confused with true labor. °What are Braxton Hicks contractions? °Braxton Hicks contractions are tightening movements that occur in the muscles of the uterus before labor. Unlike true labor contractions, these contractions do not result in opening (dilation) and thinning of the cervix. Toward the end of pregnancy (32-34 weeks), Braxton Hicks contractions can happen more often and may become stronger. These contractions are sometimes difficult to tell apart from true labor because they can be very uncomfortable. You should not feel embarrassed if you go to the hospital with false labor. °Sometimes, the only way to tell if you are in true labor is for your health care provider to look for changes in the cervix. The health care provider will do a physical exam and may monitor your contractions. If you are not in true labor, the exam should show that your cervix is not dilating and your water has not broken. °If there are other health problems associated with your pregnancy, it is completely safe for you to be sent home with false labor. You may continue to have Braxton Hicks contractions until you go into true labor. °How to tell the difference between true labor and false labor °True labor °· Contractions last 30-70 seconds. °· Contractions become very regular. °· Discomfort is usually felt in the top of the uterus, and it spreads to the lower abdomen and low back. °· Contractions do not go away with walking. °· Contractions usually become more intense and increase in frequency. °· The cervix dilates and gets thinner. °False labor °· Contractions are usually shorter and not as strong as true labor contractions. °· Contractions are usually irregular. °· Contractions  are often felt in the front of the lower abdomen and in the groin. °· Contractions may go away when you walk around or change positions while lying down. °· Contractions get weaker and are shorter-lasting as time goes on. °· The cervix usually does not dilate or become thin. °Follow these instructions at home: °· Take over-the-counter and prescription medicines only as told by your health care provider. °· Keep up with your usual exercises and follow other instructions from your health care provider. °· Eat and drink lightly if you think you are going into labor. °· If Braxton Hicks contractions are making you uncomfortable: °? Change your position from lying down or resting to walking, or change from walking to resting. °? Sit and rest in a tub of warm water. °? Drink enough fluid to keep your urine pale yellow. Dehydration may cause these contractions. °? Do slow and deep breathing several times an hour. °· Keep all follow-up prenatal visits as told by your health care provider. This is important. °Contact a health care provider if: °· You have a fever. °· You have continuous pain in your abdomen. °Get help right away if: °· Your contractions become stronger, more regular, and closer together. °· You have fluid leaking or gushing from your vagina. °· You pass blood-tinged mucus (bloody show). °· You have bleeding from your vagina. °· You have low back pain that you never had before. °· You feel your baby’s head pushing down and causing pelvic pressure. °· Your baby is not moving inside you as much as it used to. °Summary °· Contractions that occur before labor are called Braxton   Hicks contractions, false labor, or practice contractions. °· Braxton Hicks contractions are usually shorter, weaker, farther apart, and less regular than true labor contractions. True labor contractions usually become progressively stronger and regular and they become more frequent. °· Manage discomfort from Braxton Hicks contractions by  changing position, resting in a warm bath, drinking plenty of water, or practicing deep breathing. °This information is not intended to replace advice given to you by your health care provider. Make sure you discuss any questions you have with your health care provider. °Document Released: 06/29/2016 Document Revised: 06/29/2016 Document Reviewed: 06/29/2016 °Elsevier Interactive Patient Education © 2018 [employer] ° °

## 2017-11-25 NOTE — MAU Note (Signed)
I have communicated with Dr. Willis Modena and reviewed vital signs:  Vitals:   11/25/17 1156 11/25/17 1220  BP: 119/70   Pulse: (!) 119 95  Resp: 16   Temp: (!) 97.5 F (36.4 C)   SpO2: 99%     Vaginal exam:  Dilation: 3 Effacement (%): 50 Cervical Position: Middle Station: -3 Presentation: Vertex Exam by:: Bueford Arp robinson rnc,   Also reviewed contraction pattern and that non-stress test is reactive.  It has been documented that patient has had 2 contractions in the past hour with no cervical change over 1 hour not indicating active labor.  Patient denies any other complaints.  Based on this report provider has given order for discharge.  A discharge order and diagnosis entered by a provider.   Labor discharge instructions reviewed with patient.

## 2017-11-25 NOTE — MAU Note (Signed)
Been contracting since yesterday, states she feels like they are more consistent  No bleeding  Unsure if LOF, reports she had a gush of green liquid in the middle of the night but hasn't leaked anything since  Last SVE she was 2.5cm

## 2017-11-26 ENCOUNTER — Encounter (HOSPITAL_COMMUNITY): Payer: Self-pay

## 2017-11-26 ENCOUNTER — Inpatient Hospital Stay (HOSPITAL_COMMUNITY)
Admission: AD | Admit: 2017-11-26 | Discharge: 2017-11-26 | Disposition: A | Discharge status: Home / Self Care | Payer: Medicaid Other | Source: Ambulatory Visit | Attending: Obstetrics and Gynecology | Admitting: Obstetrics and Gynecology

## 2017-11-26 ENCOUNTER — Inpatient Hospital Stay (EMERGENCY_DEPARTMENT_HOSPITAL)
Admission: AD | Admit: 2017-11-26 | Discharge: 2017-11-27 | Disposition: A | Discharge status: Home / Self Care | Payer: Medicaid Other | Source: Ambulatory Visit | Attending: Obstetrics and Gynecology | Admitting: Obstetrics and Gynecology

## 2017-11-26 DIAGNOSIS — O4703 False labor before 37 completed weeks of gestation, third trimester: Secondary | ICD-10-CM | POA: Diagnosis not present

## 2017-11-26 DIAGNOSIS — Z3A37 37 weeks gestation of pregnancy: Secondary | ICD-10-CM | POA: Insufficient documentation

## 2017-11-26 DIAGNOSIS — O479 False labor, unspecified: Secondary | ICD-10-CM

## 2017-11-26 DIAGNOSIS — Z0371 Encounter for suspected problem with amniotic cavity and membrane ruled out: Secondary | ICD-10-CM | POA: Diagnosis present

## 2017-11-26 DIAGNOSIS — O26893 Other specified pregnancy related conditions, third trimester: Secondary | ICD-10-CM

## 2017-11-26 DIAGNOSIS — O471 False labor at or after 37 completed weeks of gestation: Secondary | ICD-10-CM

## 2017-11-26 NOTE — MAU Provider Note (Signed)
S: [name] is a 22 y.o. G3P1011 at [redacted]w[redacted]d  who presents to MAU today for labor evaluation.     Vitals:   11/26/17 1619 11/26/17 1620  BP:  121/67  Pulse:  95  Resp:  18  Temp:  98.3 F (36.8 C)  TempSrc:  Oral  Weight: 66.7 kg   Height: 5\' 5"  (1.651 m)     Cervical exam by RN:  Dilation: 4.5 Effacement (%): 70 Cervical Position: Middle Station: -2, -3 Presentation: Vertex Exam by:: B. Bowen, RN  Fetal Monitoring: Baseline: 125 bpm Variability: Moderate  Accelerations: 15x15 Decelerations: None Contractions: Occasional with UI  MDM Discussed patient with RN. NST reviewed.   A: SIUP at [redacted]w[redacted]d  False labor  P: Discharge home Labor precautions and kick counts included in AVS Patient to follow-up with  as scheduled  Patient may return to MAU as needed or when in labor   Rasch, Artist Pais, NP 11/26/2017 6:08 PM

## 2017-11-26 NOTE — MAU Provider Note (Signed)
S: [name] is a 22 y.o. G3P1011 at [redacted]w[redacted]d  who presents to MAU today complaining of leaking of fluid since 2130. She was watching her toddler play outside. She is "unsure if it was urine leaking or not." She denies vaginal bleeding. She endorses contractions "for past 2 days." She reports normal fetal movement.    O: BP 118/68   Pulse 100   Temp 98.2 F (36.8 C)   Resp 19   Ht 5\' 5"  (1.651 m)   Wt 67.5 kg   LMP 02/07/2017   BMI 24.75 kg/m  GENERAL: Well-developed, well-nourished female in no acute distress.  HEAD: Normocephalic, atraumatic.  CHEST: Normal effort of breathing, regular heart rate ABDOMEN: Soft, nontender, gravid PELVIC: Fern Negative, Amnisure Negative  Cervical exam:  Dilation: 4 Effacement (%): 80 Cervical Position: Middle Station: -2 Presentation: Vertex Exam by:: Sunday Corn, CNM    Fetal Monitoring: Baseline: 125 Variability: moderate Accelerations: present Decelerations: absent Contractions: irregular UC's noted  Results for orders placed or performed during the hospital encounter of 11/26/17 (from the past 24 hour(s))  Amnisure rupture of membrane (rom)not at [facility]     Status: None   Collection Time: 11/26/17 11:53 PM  Result Value Ref Range   Amnisure ROM NEGATIVE     A: SIUP at [redacted]w[redacted]d  Membranes intact  P: No leakage of amniotic fluid - Discharge home - Keep scheduled appt with [facility] on Thursday 11/29/17 - Patient verbalized an understanding of the plan of care and agrees.   Laury Deep, CNM 11/26/2017, 11:48 PM

## 2017-11-26 NOTE — MAU Note (Signed)
Felt like she had a gush around 2030-2100 of clear fluid.  Has not had much come out since then.  No VB.  Reports feeling a lot of vaginal pressure.  +FM.

## 2017-11-26 NOTE — MAU Note (Signed)
Pt presents to MAU with c/o ctx that started yesterday and have increased in intensity today. Pt denies VB and LOF. +FM

## 2017-11-26 NOTE — Discharge Instructions (Signed)
Braxton Hicks Contractions °Contractions of the uterus can occur throughout pregnancy, but they are not always a sign that you are in labor. You may have practice contractions called Braxton Hicks contractions. These false labor contractions are sometimes confused with true labor. °What are Braxton Hicks contractions? °Braxton Hicks contractions are tightening movements that occur in the muscles of the uterus before labor. Unlike true labor contractions, these contractions do not result in opening (dilation) and thinning of the cervix. Toward the end of pregnancy (32-34 weeks), Braxton Hicks contractions can happen more often and may become stronger. These contractions are sometimes difficult to tell apart from true labor because they can be very uncomfortable. You should not feel embarrassed if you go to the hospital with false labor. °Sometimes, the only way to tell if you are in true labor is for your health care provider to look for changes in the cervix. The health care provider will do a physical exam and may monitor your contractions. If you are not in true labor, the exam should show that your cervix is not dilating and your water has not broken. °If there are other health problems associated with your pregnancy, it is completely safe for you to be sent home with false labor. You may continue to have Braxton Hicks contractions until you go into true labor. °How to tell the difference between true labor and false labor °True labor °· Contractions last 30-70 seconds. °· Contractions become very regular. °· Discomfort is usually felt in the top of the uterus, and it spreads to the lower abdomen and low back. °· Contractions do not go away with walking. °· Contractions usually become more intense and increase in frequency. °· The cervix dilates and gets thinner. °False labor °· Contractions are usually shorter and not as strong as true labor contractions. °· Contractions are usually irregular. °· Contractions  are often felt in the front of the lower abdomen and in the groin. °· Contractions may go away when you walk around or change positions while lying down. °· Contractions get weaker and are shorter-lasting as time goes on. °· The cervix usually does not dilate or become thin. °Follow these instructions at home: °· Take over-the-counter and prescription medicines only as told by your health care provider. °· Keep up with your usual exercises and follow other instructions from your health care provider. °· Eat and drink lightly if you think you are going into labor. °· If Braxton Hicks contractions are making you uncomfortable: °? Change your position from lying down or resting to walking, or change from walking to resting. °? Sit and rest in a tub of warm water. °? Drink enough fluid to keep your urine pale yellow. Dehydration may cause these contractions. °? Do slow and deep breathing several times an hour. °· Keep all follow-up prenatal visits as told by your health care provider. This is important. °Contact a health care provider if: °· You have a fever. °· You have continuous pain in your abdomen. °Get help right away if: °· Your contractions become stronger, more regular, and closer together. °· You have fluid leaking or gushing from your vagina. °· You pass blood-tinged mucus (bloody show). °· You have bleeding from your vagina. °· You have low back pain that you never had before. °· You feel your baby’s head pushing down and causing pelvic pressure. °· Your baby is not moving inside you as much as it used to. °Summary °· Contractions that occur before labor are called Braxton   Hicks contractions, false labor, or practice contractions. °· Braxton Hicks contractions are usually shorter, weaker, farther apart, and less regular than true labor contractions. True labor contractions usually become progressively stronger and regular and they become more frequent. °· Manage discomfort from Braxton Hicks contractions by  changing position, resting in a warm bath, drinking plenty of water, or practicing deep breathing. °This information is not intended to replace advice given to you by your health care provider. Make sure you discuss any questions you have with your health care provider. °Document Released: 06/29/2016 Document Revised: 06/29/2016 Document Reviewed: 06/29/2016 °Elsevier Interactive Patient Education © 2018 [employer] ° °Fetal Movement Counts °Patient Name: ________________________________________________ Patient Due Date: ____________________ °What is a fetal movement count? °A fetal movement count is the number of times that you feel your baby move during a certain amount of time. This may also be called a fetal kick count. A fetal movement count is recommended for every pregnant woman. You may be asked to start counting fetal movements as early as week 28 of your pregnancy. °Pay attention to when your baby is most active. You may notice your baby's sleep and wake cycles. You may also notice things that make your baby move more. You should do a fetal movement count: °· When your baby is normally most active. °· At the same time each day. ° °A good time to count movements is while you are resting, after having something to eat and drink. °How do I count fetal movements? °1. Find a quiet, comfortable area. Sit, or lie down on your side. °2. Write down the date, the start time and stop time, and the number of movements that you felt between those two times. Take this information with you to your health care visits. °3. For 2 hours, count kicks, flutters, swishes, rolls, and jabs. You should feel at least 10 movements during 2 hours. °4. You may stop counting after you have felt 10 movements. °5. If you do not feel 10 movements in 2 hours, have something to eat and drink. Then, keep resting and counting for 1 hour. If you feel at least 4 movements during that hour, you may stop counting. °Contact a health care  provider if: °· You feel fewer than 4 movements in 2 hours. °· Your baby is not moving like he or she usually does. °Date: ____________ Start time: ____________ Stop time: ____________ Movements: ____________ °Date: ____________ Start time: ____________ Stop time: ____________ Movements: ____________ °Date: ____________ Start time: ____________ Stop time: ____________ Movements: ____________ °Date: ____________ Start time: ____________ Stop time: ____________ Movements: ____________ °Date: ____________ Start time: ____________ Stop time: ____________ Movements: ____________ °Date: ____________ Start time: ____________ Stop time: ____________ Movements: ____________ °Date: ____________ Start time: ____________ Stop time: ____________ Movements: ____________ °Date: ____________ Start time: ____________ Stop time: ____________ Movements: ____________ °Date: ____________ Start time: ____________ Stop time: ____________ Movements: ____________ °This information is not intended to replace advice given to you by your health care provider. Make sure you discuss any questions you have with your health care provider. °Document Released: 03/15/2006 Document Revised: 10/13/2015 Document Reviewed: 03/25/2015 °Elsevier Interactive Patient Education © 2018 [employer] ° °

## 2017-11-26 NOTE — MAU Note (Signed)
I have communicated with  Noni Saupe, NP and reviewed vital signs:  Vitals:   11/26/17 1620  BP: 121/67  Pulse: 95  Resp: 18  Temp: 98.3 F (36.8 C)    Vaginal exam:  Dilation: 4.5 Effacement (%): 70 Cervical Position: Middle Station: -2, -3 Presentation: Vertex Exam by:: B. Noa Galvao, RN,   Also reviewed contraction pattern and that non-stress test is reactive.  It has been documented that patient is contracting irregularly no cervical change over 1.5 hours not indicating active labor.  Patient denies any other complaints.  Based on this report provider has given order for discharge.  A discharge order and diagnosis entered by a provider.   Labor discharge instructions reviewed with patient.

## 2017-11-27 ENCOUNTER — Inpatient Hospital Stay (HOSPITAL_COMMUNITY)
Admission: AD | Admit: 2017-11-27 | Discharge: 2017-11-27 | Disposition: A | Discharge status: Home / Self Care | Payer: Medicaid Other | Source: Ambulatory Visit | Attending: Obstetrics and Gynecology | Admitting: Obstetrics and Gynecology

## 2017-11-27 ENCOUNTER — Encounter (HOSPITAL_COMMUNITY): Payer: Self-pay

## 2017-11-27 ENCOUNTER — Other Ambulatory Visit: Payer: Self-pay

## 2017-11-27 ENCOUNTER — Encounter (HOSPITAL_COMMUNITY): Payer: Self-pay | Admitting: *Deleted

## 2017-11-27 DIAGNOSIS — O26893 Other specified pregnancy related conditions, third trimester: Secondary | ICD-10-CM | POA: Diagnosis not present

## 2017-11-27 DIAGNOSIS — Z3A37 37 weeks gestation of pregnancy: Secondary | ICD-10-CM

## 2017-11-27 DIAGNOSIS — Z3689 Encounter for other specified antenatal screening: Secondary | ICD-10-CM

## 2017-11-27 DIAGNOSIS — O471 False labor at or after 37 completed weeks of gestation: Secondary | ICD-10-CM

## 2017-11-27 DIAGNOSIS — Z0371 Encounter for suspected problem with amniotic cavity and membrane ruled out: Secondary | ICD-10-CM | POA: Diagnosis present

## 2017-11-27 DIAGNOSIS — Z87891 Personal history of nicotine dependence: Secondary | ICD-10-CM | POA: Diagnosis not present

## 2017-11-27 DIAGNOSIS — O4703 False labor before 37 completed weeks of gestation, third trimester: Secondary | ICD-10-CM

## 2017-11-27 DIAGNOSIS — O479 False labor, unspecified: Secondary | ICD-10-CM

## 2017-11-27 LAB — AMNISURE RUPTURE OF MEMBRANE (ROM) NOT AT ARMC: AMNISURE: NEGATIVE

## 2017-11-27 LAB — POCT FERN TEST: POCT Fern Test: NEGATIVE — NL

## 2017-11-27 MED ORDER — PROMETHAZINE HCL 25 MG/ML IJ SOLN
25.0000 mg | Freq: Once | INTRAMUSCULAR | Status: AC
Start: 2017-11-27 — End: 2017-11-27
  Administered 2017-11-27: 25 mg via INTRAMUSCULAR
  Filled 2017-11-27: qty 1

## 2017-11-27 MED ORDER — BUTORPHANOL TARTRATE 1 MG/ML IJ SOLN
1.0000 mg | Freq: Once | INTRAMUSCULAR | Status: AC
  Administered 2017-11-27: 1 mg via INTRAMUSCULAR
  Filled 2017-11-27: qty 1

## 2017-11-27 NOTE — MAU Note (Signed)
I have communicated with Jorje Guild NP and reviewed vital signs:  Vitals:   11/27/17 2223  BP: 115/71  Pulse: (!) 124  Temp: 98.5 F (36.9 C)    Vaginal exam:  Dilation: 4.5 Effacement (%): 70 Station: -2 Presentation: Vertex Exam by:: [employer] RN,   Also reviewed contraction pattern and that non-stress test is reactive.  It has been documented that patient is contracting every 2-5 minutes with no cervical change since last exam this afternoon, not indicating active labor.  Patient denies any other complaints.  Based on this report provider has given order for discharge.  A discharge order and diagnosis entered by a provider.   Labor discharge instructions reviewed with patient.

## 2017-11-27 NOTE — MAU Note (Signed)
PT  SAYS SHE WAS HERE -  WENT HOME AND SLEPT AND WOKE WITH UC'S .    UC  ARE 1 MIN APART.   4-5 CM  . DENIES HSV AND MRSA.  GBS-  ?

## 2017-11-27 NOTE — MAU Note (Signed)
Hurting really bad.  Started about 2-3 hrs ago.  Called dr, instructed to come here. Pains getting closer and stronger. No bleeding or leaking. Was 4+ when last checked.

## 2017-11-27 NOTE — MAU Provider Note (Signed)
History     CSN: [id number]  Arrival date and time: 11/27/17 1440   First Provider Initiated Contact with Patient 11/27/17 1635      Chief Complaint  Patient presents with  . Contractions   HPI  Kimberly Gibson is a 22 y.o. G3P1011 at [redacted]w[redacted]d who presents to MAU for labor evaluation. Patient endorses bilateral lower abdominal contractions 5-8/10 beginning last night. Denies vaginal bleeding, leaking of fluid, decreased fetal movement, fever, falls, or recent illness.  Has not taken pain medication or tried other treatments. She receives her care at [facility].  OB History    Gravida  3   Para  1   Term  1   Preterm  0   AB  1   Living  1     SAB  1   TAB  0   Ectopic  0   Multiple  0   Live Births  1           Past Medical History:  Diagnosis Date  . BV (bacterial vaginosis)   . GERD (gastroesophageal reflux disease)     Past Surgical History:  Procedure Laterality Date  . WISDOM TOOTH EXTRACTION      Family History  Problem Relation Age of Onset  . Asthma Sister   . Hearing loss Neg Hx     Social History   Tobacco Use  . Smoking status: Former Research scientist (life sciences)  . Smokeless tobacco: Never Used  . Tobacco comment: quit 2011  Substance Use Topics  . Alcohol use: No  . Drug use: No    Allergies: No Known Allergies  Medications Prior to Admission  Medication Sig Dispense Refill Last Dose  . acetaminophen (TYLENOL) 500 MG tablet Take 1,000 mg by mouth every 6 (six) hours as needed for mild pain or headache.   Past Week at Unknown time  . ondansetron (ZOFRAN ODT) 8 MG disintegrating tablet Take 1 tablet (8 mg total) by mouth every 8 (eight) hours as needed for nausea or vomiting. 20 tablet 0 Past Month at Unknown time  . Prenatal Vit-Fe Fumarate-FA (PRENATAL MULTIVITAMIN) TABS tablet Take 1 tablet by mouth daily at 12 noon.   Past Week at Unknown time    Review of Systems  Constitutional: Positive for fatigue. Negative for chills and fever.   Respiratory: Negative for shortness of breath.   Gastrointestinal: Positive for abdominal pain.  Genitourinary: Negative for vaginal bleeding, vaginal discharge and vaginal pain.  Neurological: Negative for headaches.  All other systems reviewed and are negative.  Physical Exam   Blood pressure 128/89, pulse (!) 112, temperature 98.3 F (36.8 C), temperature source Oral, resp. rate 18, weight 67 kg, last menstrual period 02/07/2017, SpO2 100 %, unknown if currently breastfeeding.  Physical Exam  Nursing note and vitals reviewed. Constitutional: She is oriented to person, place, and time. She appears well-developed and well-nourished.  Cardiovascular: Normal rate.  GI:  Gravid  Genitourinary: Vagina normal and uterus normal.  Genitourinary Comments: 4-4.5/70%/-2  Neurological: She is alert and oriented to person, place, and time.  Skin: Skin is warm and dry.  Psychiatric: She has a normal mood and affect. Her behavior is normal. Thought content normal.    MAU Course  Procedures  MDM --Reactive fetal tracing: baseline 130, moderate variability, positive accelerations, no decelerations --Toco: irregular mild to moderate contractions q 2-4 minutes --Cervix unchanged after two hours ambulation. --Patient initially declined therapeutic rest and labor analgesia --Previously assessed in MAU overnight last night and  determined to be 4cm --Plan of care reviewed with Dr Terri Piedra prior to second hour of ambulating  Patient Vitals for the past 24 hrs:  BP Temp Temp src Pulse Resp SpO2 Weight  11/27/17 1911 121/77 - - (!) 101 - - -  11/27/17 1831 - - - (!) 112 - - -  11/27/17 1830 - - - (!) 105 - - -  11/27/17 1453 128/89 98.3 F (36.8 C) Oral (!) 142 18 100 % 67 kg    Meds ordered this encounter  Medications  . butorphanol (STADOL) injection 1 mg  . promethazine (PHENERGAN) injection 25 mg   Assessment and Plan  --22 y.o. G3P1011 at [redacted]w[redacted]d  --Reactive fetal tracing --No  appreciable change in cervical dilation since last night --Patient more comfortable s/p medication given in MAU, confirmed friend is driving her home --Reviewed general obstetric precautions including but not limited to falls, fever, vaginal bleeding, leaking of fluid, decreased fetal movement, headache not relieved by Tylenol, rest and PO hydration. --Discharge in stable condition, received agreement from Dr. Terri Piedra.  Darlina Rumpf, CNM 11/27/2017, 7:04 PM

## 2017-11-27 NOTE — Discharge Instructions (Signed)
Vaginal Delivery Vaginal delivery means that you will give birth by pushing your baby out of your birth canal (vagina). A team of health care providers will help you before, during, and after vaginal delivery. Birth experiences are unique for every woman and every pregnancy, and birth experiences vary depending on where you choose to give birth. What should I do to prepare for my baby's birth? Before your baby is born, it is important to talk with your health care provider about:  Your labor and delivery preferences. These may include: ? Medicines that you may be given. ? How you will manage your pain. This might include non-medical pain relief techniques or injectable pain relief such as epidural analgesia. ? How you and your baby will be monitored during labor and delivery. ? Who may be in the labor and delivery room with you. ? Your feelings about surgical delivery of your baby (cesarean delivery, or C-section) if this becomes necessary. ? Your feelings about receiving donated blood through an IV tube (blood transfusion) if this becomes necessary.  Whether you are able: ? To take pictures or videos of the birth. ? To eat during labor and delivery. ? To move around, walk, or change positions during labor and delivery.  What to expect after your baby is born, such as: ? Whether delayed umbilical cord clamping and cutting is offered. ? Who will care for your baby right after birth. ? Medicines or tests that may be recommended for your baby. ? Whether breastfeeding is supported in your hospital or birth center. ? How long you will be in the hospital or birth center.  How any medical conditions you have may affect your baby or your labor and delivery experience.  To prepare for your baby's birth, you should also:  Attend all of your health care visits before delivery (prenatal visits) as recommended by your health care provider. This is important.  Prepare your home for your baby's  arrival. Make sure that you have: ? Diapers. ? Baby clothing. ? Feeding equipment. ? Safe sleeping arrangements for you and your baby.  Install a car seat in your vehicle. Have your car seat checked by a certified car seat installer to make sure that it is installed safely.  Think about who will help you with your new baby at home for at least the first several weeks after delivery.  What can I expect when I arrive at the birth center or hospital? Once you are in labor and have been admitted into the hospital or birth center, your health care provider may:  Review your pregnancy history and any concerns you have.  Insert an IV tube into one of your veins. This is used to give you fluids and medicines.  Check your blood pressure, pulse, temperature, and heart rate (vital signs).  Check whether your bag of water (amniotic sac) has broken (ruptured).  Talk with you about your birth plan and discuss pain control options.  Monitoring Your health care provider may monitor your contractions (uterine monitoring) and your baby's heart rate (fetal monitoring). You may need to be monitored:  Often, but not continuously (intermittently).  All the time or for long periods at a time (continuously). Continuous monitoring may be needed if: ? You are taking certain medicines, such as medicine to relieve pain or make your contractions stronger. ? You have pregnancy or labor complications.  Monitoring may be done by:  Placing a special stethoscope or a handheld monitoring device on your abdomen to   check your baby's heartbeat, and feeling your abdomen for contractions. This method of monitoring does not continuously record your baby's heartbeat or your contractions.  Placing monitors on your abdomen (external monitors) to record your baby's heartbeat and the frequency and length of contractions. You may not have to wear external monitors all the time.  Placing monitors inside of your uterus  (internal monitors) to record your baby's heartbeat and the frequency, length, and strength of your contractions. ? Your health care provider may use internal monitors if he or she needs more information about the strength of your contractions or your baby's heart rate. ? Internal monitors are put in place by passing a thin, flexible wire through your vagina and into your uterus. Depending on the type of monitor, it may remain in your uterus or on your baby's head until birth. ? Your health care provider will discuss the benefits and risks of internal monitoring with you and will ask for your permission before inserting the monitors.  Telemetry. This is a type of continuous monitoring that can be done with external or internal monitors. Instead of having to stay in bed, you are able to move around during telemetry. Ask your health care provider if telemetry is an option for you.  Physical exam Your health care provider may perform a physical exam. This may include:  Checking whether your baby is positioned: ? With the head toward your vagina (head-down). This is most common. ? With the head toward the top of your uterus (head-up or breech). If your baby is in a breech position, your health care provider may try to turn your baby to a head-down position so you can deliver vaginally. If it does not seem that your baby can be born vaginally, your provider may recommend surgery to deliver your baby. In rare cases, you may be able to deliver vaginally if your baby is head-up (breech delivery). ? Lying sideways (transverse). Babies that are lying sideways cannot be delivered vaginally.  Checking your cervix to determine: ? Whether it is thinning out (effacing). ? Whether it is opening up (dilating). ? How low your baby has moved into your birth canal.  What are the three stages of labor and delivery?  Normal labor and delivery is divided into the following three stages: Stage 1  Stage 1 is the  longest stage of labor, and it can last for hours or days. Stage 1 includes: ? Early labor. This is when contractions may be irregular, or regular and mild. Generally, early labor contractions are more than 10 minutes apart. ? Active labor. This is when contractions get longer, more regular, more frequent, and more intense. ? The transition phase. This is when contractions happen very close together, are very intense, and may last longer than during any other part of labor.  Contractions generally feel mild, infrequent, and irregular at first. They get stronger, more frequent (about every 2-3 minutes), and more regular as you progress from early labor through active labor and transition.  Many women progress through stage 1 naturally, but you may need help to continue making progress. If this happens, your health care provider may talk with you about: ? Rupturing your amniotic sac if it has not ruptured yet. ? Giving you medicine to help make your contractions stronger and more frequent.  Stage 1 ends when your cervix is completely dilated to 4 inches (10 cm) and completely effaced. This happens at the end of the transition phase. Stage 2  Once   your cervix is completely effaced and dilated to 4 inches (10 cm), you may start to feel an urge to push. It is common for the body to naturally take a rest before feeling the urge to push, especially if you received an epidural or certain other pain medicines. This rest period may last for up to 1-2 hours, depending on your unique labor experience.  During stage 2, contractions are generally less painful, because pushing helps relieve contraction pain. Instead of contraction pain, you may feel stretching and burning pain, especially when the widest part of your baby's head passes through the vaginal opening (crowning).  Your health care provider will closely monitor your pushing progress and your baby's progress through the vagina during stage 2.  Your  health care provider may massage the area of skin between your vaginal opening and anus (perineum) or apply warm compresses to your perineum. This helps it stretch as the baby's head starts to crown, which can help prevent perineal tearing. ? In some cases, an incision may be made in your perineum (episiotomy) to allow the baby to pass through the vaginal opening. An episiotomy helps to make the opening of the vagina larger to allow more room for the baby to fit through.  It is very important to breathe and focus so your health care provider can control the delivery of your baby's head. Your health care provider may have you decrease the intensity of your pushing, to help prevent perineal tearing.  After delivery of your baby's head, the shoulders and the rest of the body generally deliver very quickly and without difficulty.  Once your baby is delivered, the umbilical cord may be cut right away, or this may be delayed for 1-2 minutes, depending on your baby's health. This may vary among health care providers, hospitals, and birth centers.  If you and your baby are healthy enough, your baby may be placed on your chest or abdomen to help maintain the baby's temperature and to help you bond with each other. Some mothers and babies start breastfeeding at this time. Your health care team will dry your baby and help keep your baby warm during this time.  Your baby may need immediate care if he or she: ? Showed signs of distress during labor. ? Has a medical condition. ? Was born too early (prematurely). ? Had a bowel movement before birth (meconium). ? Shows signs of difficulty transitioning from being inside the uterus to being outside of the uterus. If you are planning to breastfeed, your health care team will help you begin a feeding. Stage 3  The third stage of labor starts immediately after the birth of your baby and ends after you deliver the placenta. The placenta is an organ that develops  during pregnancy to provide oxygen and nutrients to your baby in the womb.  Delivering the placenta may require some pushing, and you may have mild contractions. Breastfeeding can stimulate contractions to help you deliver the placenta.  After the placenta is delivered, your uterus should tighten (contract) and become firm. This helps to stop bleeding in your uterus. To help your uterus contract and to control bleeding, your health care provider may: ? Give you medicine by injection, through an IV tube, by mouth, or through your rectum (rectally). ? Massage your abdomen or perform a vaginal exam to remove any blood clots that are left in your uterus. ? Empty your bladder by placing a thin, flexible tube (catheter) into your bladder. ? Encourage   you to breastfeed your baby. After labor is over, you and your baby will be monitored closely to ensure that you are both healthy until you are ready to go home. Your health care team will teach you how to care for yourself and your baby. This information is not intended to replace advice given to you by your health care provider. Make sure you discuss any questions you have with your health care provider. Document Released: 11/23/2007 Document Revised: 09/03/2015 Document Reviewed: 02/28/2015 Elsevier Interactive Patient Education  2018 [employer]  

## 2017-11-27 NOTE — Progress Notes (Signed)
NST:  Baseline: 140 bpm, Variability: Good {> 6 bpm), Accelerations: Reactive and Decelerations: Absent  Reactive NST VSS SVE unchanged from previous visit.   Jorje Guild, NP

## 2017-11-27 NOTE — Discharge Instructions (Signed)
Braxton Hicks Contractions °Contractions of the uterus can occur throughout pregnancy, but they are not always a sign that you are in labor. You may have practice contractions called Braxton Hicks contractions. These false labor contractions are sometimes confused with true labor. °What are Braxton Hicks contractions? °Braxton Hicks contractions are tightening movements that occur in the muscles of the uterus before labor. Unlike true labor contractions, these contractions do not result in opening (dilation) and thinning of the cervix. Toward the end of pregnancy (32-34 weeks), Braxton Hicks contractions can happen more often and may become stronger. These contractions are sometimes difficult to tell apart from true labor because they can be very uncomfortable. You should not feel embarrassed if you go to the hospital with false labor. °Sometimes, the only way to tell if you are in true labor is for your health care provider to look for changes in the cervix. The health care provider will do a physical exam and may monitor your contractions. If you are not in true labor, the exam should show that your cervix is not dilating and your water has not broken. °If there are other health problems associated with your pregnancy, it is completely safe for you to be sent home with false labor. You may continue to have Braxton Hicks contractions until you go into true labor. °How to tell the difference between true labor and false labor °True labor °· Contractions last 30-70 seconds. °· Contractions become very regular. °· Discomfort is usually felt in the top of the uterus, and it spreads to the lower abdomen and low back. °· Contractions do not go away with walking. °· Contractions usually become more intense and increase in frequency. °· The cervix dilates and gets thinner. °False labor °· Contractions are usually shorter and not as strong as true labor contractions. °· Contractions are usually irregular. °· Contractions  are often felt in the front of the lower abdomen and in the groin. °· Contractions may go away when you walk around or change positions while lying down. °· Contractions get weaker and are shorter-lasting as time goes on. °· The cervix usually does not dilate or become thin. °Follow these instructions at home: °· Take over-the-counter and prescription medicines only as told by your health care provider. °· Keep up with your usual exercises and follow other instructions from your health care provider. °· Eat and drink lightly if you think you are going into labor. °· If Braxton Hicks contractions are making you uncomfortable: °? Change your position from lying down or resting to walking, or change from walking to resting. °? Sit and rest in a tub of warm water. °? Drink enough fluid to keep your urine pale yellow. Dehydration may cause these contractions. °? Do slow and deep breathing several times an hour. °· Keep all follow-up prenatal visits as told by your health care provider. This is important. °Contact a health care provider if: °· You have a fever. °· You have continuous pain in your abdomen. °Get help right away if: °· Your contractions become stronger, more regular, and closer together. °· You have fluid leaking or gushing from your vagina. °· You pass blood-tinged mucus (bloody show). °· You have bleeding from your vagina. °· You have low back pain that you never had before. °· You feel your baby’s head pushing down and causing pelvic pressure. °· Your baby is not moving inside you as much as it used to. °Summary °· Contractions that occur before labor are called Braxton   Hicks contractions, false labor, or practice contractions. °· Braxton Hicks contractions are usually shorter, weaker, farther apart, and less regular than true labor contractions. True labor contractions usually become progressively stronger and regular and they become more frequent. °· Manage discomfort from Braxton Hicks contractions by  changing position, resting in a warm bath, drinking plenty of water, or practicing deep breathing. °This information is not intended to replace advice given to you by your health care provider. Make sure you discuss any questions you have with your health care provider. °Document Released: 06/29/2016 Document Revised: 06/29/2016 Document Reviewed: 06/29/2016 °Elsevier Interactive Patient Education © 2018 [employer] ° °Fetal Movement Counts °Patient Name: ________________________________________________ Patient Due Date: ____________________ °What is a fetal movement count? °A fetal movement count is the number of times that you feel your baby move during a certain amount of time. This may also be called a fetal kick count. A fetal movement count is recommended for every pregnant woman. You may be asked to start counting fetal movements as early as week 28 of your pregnancy. °Pay attention to when your baby is most active. You may notice your baby's sleep and wake cycles. You may also notice things that make your baby move more. You should do a fetal movement count: °· When your baby is normally most active. °· At the same time each day. ° °A good time to count movements is while you are resting, after having something to eat and drink. °How do I count fetal movements? °1. Find a quiet, comfortable area. Sit, or lie down on your side. °2. Write down the date, the start time and stop time, and the number of movements that you felt between those two times. Take this information with you to your health care visits. °3. For 2 hours, count kicks, flutters, swishes, rolls, and jabs. You should feel at least 10 movements during 2 hours. °4. You may stop counting after you have felt 10 movements. °5. If you do not feel 10 movements in 2 hours, have something to eat and drink. Then, keep resting and counting for 1 hour. If you feel at least 4 movements during that hour, you may stop counting. °Contact a health care  provider if: °· You feel fewer than 4 movements in 2 hours. °· Your baby is not moving like he or she usually does. °Date: ____________ Start time: ____________ Stop time: ____________ Movements: ____________ °Date: ____________ Start time: ____________ Stop time: ____________ Movements: ____________ °Date: ____________ Start time: ____________ Stop time: ____________ Movements: ____________ °Date: ____________ Start time: ____________ Stop time: ____________ Movements: ____________ °Date: ____________ Start time: ____________ Stop time: ____________ Movements: ____________ °Date: ____________ Start time: ____________ Stop time: ____________ Movements: ____________ °Date: ____________ Start time: ____________ Stop time: ____________ Movements: ____________ °Date: ____________ Start time: ____________ Stop time: ____________ Movements: ____________ °Date: ____________ Start time: ____________ Stop time: ____________ Movements: ____________ °This information is not intended to replace advice given to you by your health care provider. Make sure you discuss any questions you have with your health care provider. °Document Released: 03/15/2006 Document Revised: 10/13/2015 Document Reviewed: 03/25/2015 °Elsevier Interactive Patient Education © 2018 [employer] ° °

## 2017-12-01 ENCOUNTER — Inpatient Hospital Stay (HOSPITAL_COMMUNITY)
Admission: AD | Admit: 2017-12-01 | Discharge: 2017-12-01 | Disposition: A | Discharge status: Home / Self Care | Payer: Medicaid Other | Source: Ambulatory Visit | Attending: Obstetrics and Gynecology | Admitting: Obstetrics and Gynecology

## 2017-12-01 ENCOUNTER — Encounter (HOSPITAL_COMMUNITY): Payer: Self-pay

## 2017-12-01 DIAGNOSIS — Z3689 Encounter for other specified antenatal screening: Secondary | ICD-10-CM

## 2017-12-01 DIAGNOSIS — O471 False labor at or after 37 completed weeks of gestation: Secondary | ICD-10-CM | POA: Diagnosis not present

## 2017-12-01 DIAGNOSIS — M545 Low back pain: Secondary | ICD-10-CM

## 2017-12-01 DIAGNOSIS — Z0371 Encounter for suspected problem with amniotic cavity and membrane ruled out: Secondary | ICD-10-CM

## 2017-12-01 DIAGNOSIS — Z87891 Personal history of nicotine dependence: Secondary | ICD-10-CM | POA: Diagnosis not present

## 2017-12-01 DIAGNOSIS — O479 False labor, unspecified: Secondary | ICD-10-CM

## 2017-12-01 DIAGNOSIS — O99613 Diseases of the digestive system complicating pregnancy, third trimester: Secondary | ICD-10-CM | POA: Diagnosis not present

## 2017-12-01 DIAGNOSIS — Z3A37 37 weeks gestation of pregnancy: Secondary | ICD-10-CM | POA: Insufficient documentation

## 2017-12-01 DIAGNOSIS — O26893 Other specified pregnancy related conditions, third trimester: Secondary | ICD-10-CM | POA: Diagnosis not present

## 2017-12-01 DIAGNOSIS — K219 Gastro-esophageal reflux disease without esophagitis: Secondary | ICD-10-CM | POA: Insufficient documentation

## 2017-12-01 DIAGNOSIS — N939 Abnormal uterine and vaginal bleeding, unspecified: Secondary | ICD-10-CM | POA: Diagnosis present

## 2017-12-01 DIAGNOSIS — O4693 Antepartum hemorrhage, unspecified, third trimester: Secondary | ICD-10-CM | POA: Insufficient documentation

## 2017-12-01 DIAGNOSIS — Z79899 Other long term (current) drug therapy: Secondary | ICD-10-CM | POA: Insufficient documentation

## 2017-12-01 LAB — URINALYSIS, ROUTINE W REFLEX MICROSCOPIC
Bilirubin Urine: NEGATIVE
Glucose, UA: NEGATIVE mg/dL
Hgb urine dipstick: NEGATIVE
Ketones, ur: NEGATIVE mg/dL
Nitrite: NEGATIVE
Protein, ur: NEGATIVE mg/dL
Specific Gravity, Urine: 1.005 (ref 1.005–1.030)
pH: 7 (ref 5.0–8.0)

## 2017-12-01 MED ORDER — CYCLOBENZAPRINE HCL 10 MG PO TABS: 10.0000 mg | ORAL_TABLET | Freq: Two times a day (BID) | ORAL | 0 refills | Status: DC | PRN

## 2017-12-01 MED ORDER — LACTATED RINGERS IV BOLUS: 1000.0000 mL | Freq: Once | INTRAVENOUS | Status: DC

## 2017-12-01 NOTE — MAU Note (Signed)
Pt is a G3P12 at 37.6 weeks c/o constant pain in "butt and everywhere" with spotting that has resolved.  Pt states that the pain has changed from last visit to MAU.

## 2017-12-01 NOTE — MAU Note (Signed)
Kimberly Gibson is a 22 y.o. at [redacted]w[redacted]d here in MAU reporting: +abominal cramping/?contractions +vaginal bleeding; started yesterday and has gotten lighter today. Not currently having to wear a pad +vaginal pressure Pain score: 7/10 Vitals:   12/01/17 1703  BP: 127/80  Pulse: (!) 102  Resp: 18  Temp: 98.1 F (36.7 C)  SpO2: 100%      Lab orders placed from triage: none

## 2017-12-01 NOTE — MAU Provider Note (Addendum)
History     CSN: [id number]  Arrival date and time: 12/01/17 1649   First Provider Initiated Contact with Patient 12/01/17 1752      Chief Complaint  Patient presents with  . Vaginal Bleeding  . Contractions  . vaginal pressure   Kimberly Gibson is a 22 y.o. G2P1 at [redacted]w[redacted]d who presents to MAU with complaints of contractions, back pain and vaginal bleeding. She reports contractions have been occurring since she was seen in MAU on 9/29- rates pain 5/10 with contractions, denies having to breath through contractions and reports contraction pain has decreased from when she was here 3 days ago. She reports back pain and vaginal pressure is associated with contractions, rates back pain 8/10- has taken tylenol 1000mg  for back pain with no relief of pain. She reports drinking 4-5 bottles of water per day and drove to MAU. She reports vaginal bleeding that started 2 days ago, describes the vaginal bleeding as dark red spotting when she wipes, wore panty liner one day but denies having to change pads. She reports vaginal bleeding stopped this morning. She denies recent IC. Was 4.5cm on 10/1 when she was checked in MAU. She reports +FM, denies LOF or complications during this pregnancy. Next prenatal appointment is scheduled for Wednesday 10/9.    OB History    Gravida  3   Para  1   Term  1   Preterm  0   AB  1   Living  1     SAB  1   TAB  0   Ectopic  0   Multiple  0   Live Births  1           Past Medical History:  Diagnosis Date  . BV (bacterial vaginosis)   . GERD (gastroesophageal reflux disease)     Past Surgical History:  Procedure Laterality Date  . WISDOM TOOTH EXTRACTION      Family History  Problem Relation Age of Onset  . Asthma Sister   . Hearing loss Neg Hx     Social History   Tobacco Use  . Smoking status: Former Research scientist (life sciences)  . Smokeless tobacco: Never Used  . Tobacco comment: quit 2011  Substance Use Topics  . Alcohol use: No  . Drug use: No     Allergies: No Known Allergies  Medications Prior to Admission  Medication Sig Dispense Refill Last Dose  . acetaminophen (TYLENOL) 500 MG tablet Take 1,000 mg by mouth every 6 (six) hours as needed for mild pain or headache.   Past Week at Unknown time  . ondansetron (ZOFRAN ODT) 8 MG disintegrating tablet Take 1 tablet (8 mg total) by mouth every 8 (eight) hours as needed for nausea or vomiting. 20 tablet 0 Past Week at Unknown time  . Prenatal Vit-Fe Fumarate-FA (PRENATAL MULTIVITAMIN) TABS tablet Take 1 tablet by mouth daily at 12 noon.   Past Week at Unknown time    Review of Systems  Constitutional: Negative.   Respiratory: Negative.   Cardiovascular: Negative.   Gastrointestinal: Positive for abdominal pain. Negative for constipation, diarrhea, nausea and vomiting.  Genitourinary: Positive for vaginal bleeding. Negative for difficulty urinating, dysuria, frequency, urgency and vaginal discharge.       No vaginal bleeding currently  Musculoskeletal: Positive for back pain.   Physical Exam   Blood pressure 127/80, pulse (!) 102, temperature 98.1 F (36.7 C), temperature source Oral, resp. rate 18, weight 67.6 kg, last menstrual period 02/07/2017, SpO2 100 %,  unknown if currently breastfeeding.  Physical Exam  Nursing note and vitals reviewed. Constitutional: She is oriented to person, place, and time. She appears well-developed and well-nourished. No distress.  Cardiovascular: Normal rate, regular rhythm and normal heart sounds.  Respiratory: Effort normal and breath sounds normal. No respiratory distress. She has no wheezes.  GI: Soft.  Gravid appropriate for gestational age, mild contractions palpated  Genitourinary: No bleeding in the vagina. No vaginal discharge found.  Musculoskeletal: Normal range of motion. She exhibits no edema.  Neurological: She is alert and oriented to person, place, and time.  Psychiatric: She has a normal mood and affect. Her behavior is  normal. Thought content normal.  Pelvic examination: No vaginal bleeding present, normal discharge   Dilation: 4.5 Effacement (%): 70 Cervical Position: [place]: -2 Presentation: Vertex Exam by:: Stann Mainland, CNM  FHR: 130/ moderate variability/ +accels/ no decelerations Toco: irregular mild contractions   MAU Course  Procedures  MDM Reassessment of cervix in 2 hours  Hydration  Unable to give pain medication due to patient driving to MAU  UA- negative  Urine culture pending   Cervix exam after 2 hours noted no cervical change and no vaginal bleeding. Educated and discussed pelvic pressure and back pain in pregnancy, patient has maternity support belt at home that she denies using. Encouraged use of maternity support belt for daily use. Rx for Flexeril sent to pharmacy of choice for back pain. Pt stable at time of discharge.  Discussed reasons to return to MAU, educated on physiology of vaginal bleeding after multiple cervical checks. Patient verbalizes understanding. NST reactive.   Assessment and Plan   1. Labor, false (Braxton-Hicks), antepartum   2. No leakage of amniotic fluid into vagina   3. [redacted] weeks gestation of pregnancy   4. NST (non-stress test) reactive   5. Low back pain during pregnancy in third trimester    Discharge home  Follow up as scheduled for prenatal appointment  Return to MAU as needed for reasons discussed  Labor precautions and fetal kick counts  Maternity support belt  Rx for Flexeril  Hydration   Follow-up Information    [facility] [place] Go on 12/05/2017.   Why:  Follow up as scheduled for prenatal appointment and return to MAU as needed for reasons discussed  Contact information: [address] [place] [address] [phone]          Allergies as of 12/01/2017   No Known Allergies     Medication List    TAKE these medications   acetaminophen 500 MG tablet Commonly known as:  TYLENOL Take 1,000 mg  by mouth every 6 (six) hours as needed for mild pain or headache.   cyclobenzaprine 10 MG tablet Commonly known as:  FLEXERIL Take 1 tablet (10 mg total) by mouth 2 (two) times daily as needed for muscle spasms.   ondansetron 8 MG disintegrating tablet Commonly known as:  ZOFRAN-ODT Take 1 tablet (8 mg total) by mouth every 8 (eight) hours as needed for nausea or vomiting.   prenatal multivitamin Tabs tablet Take 1 tablet by mouth daily at 12 noon.      Lajean Manes CNM 12/01/2017, 7:47 PM

## 2017-12-03 LAB — CULTURE, OB URINE

## 2017-12-06 ENCOUNTER — Telehealth (HOSPITAL_COMMUNITY): Payer: Self-pay | Admitting: *Deleted

## 2017-12-06 NOTE — Telephone Encounter (Signed)
Preadmission screen  

## 2017-12-11 ENCOUNTER — Encounter (HOSPITAL_COMMUNITY): Payer: Self-pay

## 2017-12-11 DIAGNOSIS — Z3480 Encounter for supervision of other normal pregnancy, unspecified trimester: Secondary | ICD-10-CM

## 2017-12-11 HISTORY — DX: Encounter for supervision of other normal pregnancy, unspecified trimester: Z34.80

## 2017-12-11 NOTE — H&P (Signed)
Kimberly Gibson is a 22 y.o. female G2P1001 at 39+ for IOL given favorable cervix and term.  Relatively uncomplicated prenatal care.  Normal first trimester screen.  Early prenatal care complicated by [facility].    OB History    Gravida  3   Para  1   Term  1   Preterm  0   AB  1   Living  1     SAB  1   TAB  0   Ectopic  0   Multiple  0   Live Births  1         G1 7/15 - female - SVD 6#15, Hulen Skains G2 present  No abn pap No STD  Past Medical History:  Diagnosis Date  . BV (bacterial vaginosis)   . GERD (gastroesophageal reflux disease)   . Normal pregnancy in multigravida, antepartum 12/11/2017   Past Surgical History:  Procedure Laterality Date  . WISDOM TOOTH EXTRACTION     Family History: family history includes Asthma in her sister. HTN, anemia, lupus, thyroid dz Social History:  reports that she has quit smoking. She has never used smokeless tobacco. She reports that she does not drink alcohol or use drugs. engaged  Meds iron  All NKDA     Maternal Diabetes: No Genetic Screening: Normal Maternal Ultrasounds/Referrals: Normal Fetal Ultrasounds or other Referrals:  None Maternal Substance Abuse:  No Significant Maternal Medications:  None Significant Maternal Lab Results:  Lab values include: Group B Strep negative Other Comments:  None  Review of Systems  Constitutional: Negative.   HENT: Negative.   Eyes: Negative.   Respiratory: Negative.   Cardiovascular: Negative.   Gastrointestinal: Negative.   Genitourinary:       Pelvic pressure  Musculoskeletal: Negative.   Skin: Negative.   Neurological: Negative.   Psychiatric/Behavioral: Negative.    Maternal Medical History:  Contractions: Frequency: irregular.    Fetal activity: Perceived fetal activity is normal.    Prenatal complications: no prenatal complications Prenatal Complications - Diabetes: none.      Last menstrual period 02/07/2017, unknown if currently breastfeeding. Maternal  Exam:  Uterine Assessment: Contraction strength is moderate.  Abdomen: Patient reports no abdominal tenderness. Fundal height is appropriate for gestation.   Estimated fetal weight is 6.5-7#.   Fetal presentation: vertex  Introitus: Normal vulva. Normal vagina.    Physical Exam  Constitutional: She is oriented to person, place, and time. She appears well-developed and well-nourished.  HENT:  Head: Normocephalic and atraumatic.  Cardiovascular: Normal rate and regular rhythm.  Respiratory: Effort normal and breath sounds normal. No respiratory distress. She has no wheezes.  GI: Soft. Bowel sounds are normal. There is no tenderness.  Musculoskeletal: Normal range of motion.  Neurological: She is alert and oriented to person, place, and time.  Skin: Skin is warm and dry.  Psychiatric: She has a normal mood and affect. Her behavior is normal.    Prenatal labs: ABO, Rh: A/Positive/-- (03/20 0000) Antibody: n (03/20 0000) Rubella: Immune (03/20 0000) RPR: Nonreactive (03/20 0000)  HBsAg: Negative (03/20 0000)  HIV: Non-reactive (03/20 0000)  GBS: Negative (09/23 0000)  Nl Hgb electro/CF neg/Hgb 12.8/Plt 373K/Ur Cx negGC neg/Chl neg/Varicella immune First trimester screen WNL/ nl NT/glucola 102  Nl anat, post plac, female  Assessment/Plan: 22yo G2P1001 at 39+ with favorable cervix and term status Admit AROM and pitocin to augment Epidural, IV pain meds, nitrous prn Expect SVD  Talana Slatten Bovard-Stuckert 12/11/2017, 8:53 PM

## 2017-12-12 ENCOUNTER — Inpatient Hospital Stay (HOSPITAL_COMMUNITY)
Admission: RE | Admit: 2017-12-12 | Discharge: 2017-12-14 | DRG: 807 | Disposition: A | Discharge status: Home / Self Care | Payer: Medicaid Other | Attending: Obstetrics and Gynecology | Admitting: Obstetrics and Gynecology

## 2017-12-12 ENCOUNTER — Inpatient Hospital Stay (HOSPITAL_COMMUNITY): Payer: Medicaid Other | Admitting: Anesthesiology

## 2017-12-12 ENCOUNTER — Encounter (HOSPITAL_COMMUNITY): Payer: Self-pay

## 2017-12-12 DIAGNOSIS — Z3480 Encounter for supervision of other normal pregnancy, unspecified trimester: Secondary | ICD-10-CM

## 2017-12-12 DIAGNOSIS — Z87891 Personal history of nicotine dependence: Secondary | ICD-10-CM

## 2017-12-12 DIAGNOSIS — Z3A39 39 weeks gestation of pregnancy: Secondary | ICD-10-CM | POA: Diagnosis not present

## 2017-12-12 DIAGNOSIS — Z3483 Encounter for supervision of other normal pregnancy, third trimester: Secondary | ICD-10-CM

## 2017-12-12 DIAGNOSIS — O9902 Anemia complicating childbirth: Secondary | ICD-10-CM | POA: Diagnosis present

## 2017-12-12 DIAGNOSIS — D649 Anemia, unspecified: Secondary | ICD-10-CM | POA: Diagnosis present

## 2017-12-12 HISTORY — DX: Encounter for supervision of other normal pregnancy, unspecified trimester: Z34.80

## 2017-12-12 LAB — CBC
HEMATOCRIT: 31.6 % — AB (ref 36.0–46.0)
HEMOGLOBIN: 9.5 g/dL — AB (ref 12.0–15.0)
MCH: 21.1 pg — ABNORMAL LOW (ref 26.0–34.0)
MCHC: 30.1 g/dL (ref 30.0–36.0)
MCV: 70.2 fL — ABNORMAL LOW (ref 80.0–100.0)
NRBC: 0 % (ref 0.0–0.2)
Platelets: 334 10*3/uL (ref 150–400)
RBC: 4.5 MIL/uL (ref 3.87–5.11)
RDW: 17.2 % — ABNORMAL HIGH (ref 11.5–15.5)
WBC: 11.6 10*3/uL — ABNORMAL HIGH (ref 4.0–10.5)

## 2017-12-12 LAB — TYPE AND SCREEN
ABO/RH(D): A POS
ANTIBODY SCREEN: NEGATIVE

## 2017-12-12 LAB — RPR: RPR: NONREACTIVE

## 2017-12-12 MED ORDER — TETANUS-DIPHTH-ACELL PERTUSSIS 5-2.5-18.5 LF-MCG/0.5 IM SUSP: 0.5000 mL | Freq: Once | INTRAMUSCULAR | Status: DC

## 2017-12-12 MED ORDER — OXYCODONE-ACETAMINOPHEN 5-325 MG PO TABS: 1.0000 | ORAL_TABLET | ORAL | Status: DC | PRN

## 2017-12-12 MED ORDER — LACTATED RINGERS IV SOLN
500.0000 mL | Freq: Once | INTRAVENOUS | Status: AC
  Administered 2017-12-12: 500 mL via INTRAVENOUS

## 2017-12-12 MED ORDER — DIPHENHYDRAMINE HCL 25 MG PO CAPS: 25.0000 mg | ORAL_CAPSULE | Freq: Four times a day (QID) | ORAL | Status: DC | PRN

## 2017-12-12 MED ORDER — DIBUCAINE 1 % RE OINT: 1.0000 "application " | TOPICAL_OINTMENT | RECTAL | Status: DC | PRN

## 2017-12-12 MED ORDER — LIDOCAINE-EPINEPHRINE (PF) 2 %-1:200000 IJ SOLN
INTRAMUSCULAR | Status: DC | PRN
  Administered 2017-12-12: 14 mL via EPIDURAL

## 2017-12-12 MED ORDER — LIDOCAINE HCL (PF) 1 % IJ SOLN: INTRAMUSCULAR | Status: DC | PRN

## 2017-12-12 MED ORDER — ONDANSETRON HCL 4 MG/2ML IJ SOLN: 4.0000 mg | Freq: Four times a day (QID) | INTRAMUSCULAR | Status: DC | PRN

## 2017-12-12 MED ORDER — EPHEDRINE 5 MG/ML INJ
10.0000 mg | INTRAVENOUS | Status: DC | PRN
  Filled 2017-12-12: qty 2

## 2017-12-12 MED ORDER — SIMETHICONE 80 MG PO CHEW: 80.0000 mg | CHEWABLE_TABLET | ORAL | Status: DC | PRN

## 2017-12-12 MED ORDER — LIDOCAINE HCL (PF) 1 % IJ SOLN
INTRAMUSCULAR | Status: DC | PRN
  Administered 2017-12-12: 8 mL

## 2017-12-12 MED ORDER — OXYTOCIN BOLUS FROM INFUSION
500.0000 mL | Freq: Once | INTRAVENOUS | Status: AC
  Administered 2017-12-12: 500 mL via INTRAVENOUS

## 2017-12-12 MED ORDER — FENTANYL 2.5 MCG/ML BUPIVACAINE 1/10 % EPIDURAL INFUSION (WH - ANES)
14.0000 mL/h | INTRAMUSCULAR | Status: DC | PRN
  Filled 2017-12-12: qty 100

## 2017-12-12 MED ORDER — OXYTOCIN 40 UNITS IN LACTATED RINGERS INFUSION - SIMPLE MED: 2.5000 [IU]/h | INTRAVENOUS | Status: DC

## 2017-12-12 MED ORDER — DIPHENHYDRAMINE HCL 50 MG/ML IJ SOLN: 12.5000 mg | INTRAMUSCULAR | Status: DC | PRN

## 2017-12-12 MED ORDER — WITCH HAZEL-GLYCERIN EX PADS: 1.0000 "application " | MEDICATED_PAD | CUTANEOUS | Status: DC | PRN

## 2017-12-12 MED ORDER — PHENYLEPHRINE 40 MCG/ML (10ML) SYRINGE FOR IV PUSH (FOR BLOOD PRESSURE SUPPORT)
80.0000 ug | PREFILLED_SYRINGE | INTRAVENOUS | Status: DC | PRN
  Filled 2017-12-12: qty 5
  Filled 2017-12-12: qty 10

## 2017-12-12 MED ORDER — PHENYLEPHRINE 40 MCG/ML (10ML) SYRINGE FOR IV PUSH (FOR BLOOD PRESSURE SUPPORT)
80.0000 ug | PREFILLED_SYRINGE | INTRAVENOUS | Status: DC | PRN
  Filled 2017-12-12: qty 5

## 2017-12-12 MED ORDER — LIDOCAINE HCL (PF) 1 % IJ SOLN
30.0000 mL | INTRAMUSCULAR | Status: DC | PRN
  Filled 2017-12-12: qty 30

## 2017-12-12 MED ORDER — BENZOCAINE-MENTHOL 20-0.5 % EX AERO
1.0000 "application " | INHALATION_SPRAY | CUTANEOUS | Status: DC | PRN
  Administered 2017-12-12: 1 via TOPICAL
  Filled 2017-12-12: qty 56

## 2017-12-12 MED ORDER — OXYTOCIN 40 UNITS IN LACTATED RINGERS INFUSION - SIMPLE MED
1.0000 m[IU]/min | INTRAVENOUS | Status: DC
  Administered 2017-12-12: 2 m[IU]/min via INTRAVENOUS
  Filled 2017-12-12: qty 1000

## 2017-12-12 MED ORDER — BUTORPHANOL TARTRATE 1 MG/ML IJ SOLN: 1.0000 mg | INTRAMUSCULAR | Status: DC | PRN

## 2017-12-12 MED ORDER — LACTATED RINGERS IV SOLN
INTRAVENOUS | Status: DC
  Administered 2017-12-12 (×2): via INTRAVENOUS

## 2017-12-12 MED ORDER — LACTATED RINGERS IV SOLN: 500.0000 mL | INTRAVENOUS | Status: DC | PRN

## 2017-12-12 MED ORDER — ONDANSETRON HCL 4 MG PO TABS: 4.0000 mg | ORAL_TABLET | ORAL | Status: DC | PRN

## 2017-12-12 MED ORDER — FENTANYL CITRATE (PF) 100 MCG/2ML IJ SOLN
INTRAMUSCULAR | Status: AC
  Filled 2017-12-12: qty 2

## 2017-12-12 MED ORDER — SOD CITRATE-CITRIC ACID 500-334 MG/5ML PO SOLN: 30.0000 mL | ORAL | Status: DC | PRN

## 2017-12-12 MED ORDER — ACETAMINOPHEN 325 MG PO TABS: 650.0000 mg | ORAL_TABLET | ORAL | Status: DC | PRN

## 2017-12-12 MED ORDER — ZOLPIDEM TARTRATE 5 MG PO TABS: 5.0000 mg | ORAL_TABLET | Freq: Every evening | ORAL | Status: DC | PRN

## 2017-12-12 MED ORDER — SENNOSIDES-DOCUSATE SODIUM 8.6-50 MG PO TABS
2.0000 | ORAL_TABLET | ORAL | Status: DC
  Administered 2017-12-13 (×2): 2 via ORAL
  Filled 2017-12-12 (×2): qty 2

## 2017-12-12 MED ORDER — LACTATED RINGERS IV SOLN: INTRAVENOUS | Status: DC

## 2017-12-12 MED ORDER — ONDANSETRON HCL 4 MG/2ML IJ SOLN: 4.0000 mg | INTRAMUSCULAR | Status: DC | PRN

## 2017-12-12 MED ORDER — TERBUTALINE SULFATE 1 MG/ML IJ SOLN
0.2500 mg | Freq: Once | INTRAMUSCULAR | Status: DC | PRN
  Filled 2017-12-12: qty 1

## 2017-12-12 MED ORDER — IBUPROFEN 600 MG PO TABS
600.0000 mg | ORAL_TABLET | Freq: Four times a day (QID) | ORAL | Status: DC
  Administered 2017-12-12 – 2017-12-14 (×8): 600 mg via ORAL
  Filled 2017-12-12 (×9): qty 1

## 2017-12-12 MED ORDER — COCONUT OIL OIL
1.0000 "application " | TOPICAL_OIL | Status: DC | PRN
  Administered 2017-12-12: 1 via TOPICAL
  Filled 2017-12-12: qty 120

## 2017-12-12 MED ORDER — OXYCODONE-ACETAMINOPHEN 5-325 MG PO TABS: 2.0000 | ORAL_TABLET | ORAL | Status: DC | PRN

## 2017-12-12 MED ORDER — PRENATAL MULTIVITAMIN CH
1.0000 | ORAL_TABLET | Freq: Every day | ORAL | Status: DC
  Administered 2017-12-13 – 2017-12-14 (×2): 1 via ORAL
  Filled 2017-12-12 (×2): qty 1

## 2017-12-12 NOTE — Anesthesia Procedure Notes (Signed)
Epidural Patient location during procedure: OB Start time: 12/12/2017 9:15 AM End time: 12/12/2017 9:20 AM  Staffing Anesthesiologist: Janeece Riggers, MD  Preanesthetic Checklist Completed: patient identified, site marked, surgical consent, pre-op evaluation, timeout performed, IV checked, risks and benefits discussed and monitors and equipment checked  Epidural Patient position: sitting Prep: site prepped and draped and DuraPrep Patient monitoring: continuous pulse ox and blood pressure Approach: midline Location: L4-L5 Injection technique: LOR air  Needle:  Needle type: Tuohy  Needle gauge: 17 G Needle length: 9 cm and 9 Needle insertion depth: 5 cm cm Catheter type: closed end flexible Catheter size: 19 Gauge Catheter at skin depth: 10 cm Test dose: negative  Assessment Events: blood not aspirated, injection not painful, no injection resistance, negative IV test and no paresthesia

## 2017-12-12 NOTE — Anesthesia Postprocedure Evaluation (Signed)
Anesthesia Post Note  Patient: Kimberly Gibson  Procedure(s) Performed: AN AD HOC LABOR EPIDURAL     Patient location during evaluation: Mother Baby Anesthesia Type: Epidural Level of consciousness: awake and alert Pain management: pain level controlled Vital Signs Assessment: post-procedure vital signs reviewed and stable Respiratory status: spontaneous breathing, nonlabored ventilation and respiratory function stable Cardiovascular status: stable Postop Assessment: no headache, no backache and epidural receding Anesthetic complications: no    Last Vitals:  Vitals:   12/12/17 1435 12/12/17 1530  BP: 121/78 110/75  Pulse: 84 89  Resp: 18 18  Temp: 36.4 C 36.6 C  SpO2:      Last Pain:  Vitals:   12/12/17 1530  TempSrc: Oral  PainSc:    Pain Goal: Patients Stated Pain Goal: 4 (12/12/17 0749)               Ciro Tashiro

## 2017-12-12 NOTE — Progress Notes (Signed)
Patient ID: Kimberly Gibson, female   DOB: 18-Nov-1995, 22 y.o.   MRN: [id number]   H&P reviewed, no changes.  Comfortable w epidural  AFVSS gen NAD FHTs - 140's, min-mod var, + scalp stim, category 1 toco 4 min  AROM for clear fluid, w/o diff/comp  SVE 4.5/70/-1  Continue IOL

## 2017-12-12 NOTE — Anesthesia Pain Management Evaluation Note (Signed)
  CRNA Pain Management Visit Note  Patient: Kimberly Gibson, 22 y.o., female  "Hello I am a member of the anesthesia team at [facility]. We have an anesthesia team available at all times to provide care throughout the hospital, including epidural management and anesthesia for C-section. I don't know your plan for the delivery whether it a natural birth, water birth, IV sedation, nitrous supplementation, doula or epidural, but we want to meet your pain goals."   1.Was your pain managed to your expectations on prior hospitalizations?   Yes   2.What is your expectation for pain management during this hospitalization?     Epidural  3.How can we help you reach that goal? unsure  Record the patient's initial score and the patient's pain goal.   Pain: 4  Pain Goal: 6 [facility] wants you to be able to say your pain was always managed very well.  Casimer Lanius 12/12/2017

## 2017-12-12 NOTE — Lactation Note (Signed)
This note was copied from a baby's chart. Lactation Consultation Note  Patient Name: Kimberly Gibson [id number] Date: 12/12/2017 Reason for consult: Initial assessment;Term Breastfeeding consultation services and support information given to mom.  This is her second baby.  She states she did some hand expression with her first baby.  She desires to both breast and formula feed newborn.  Discussed colostrum and baby's small stomach.  Instructed to feed with cues and offer breast before bottle.  Mom reports baby has latched twice.  Encouraged to call for assist/concerns prn.  Maternal Data Has patient been taught Hand Expression?: Yes Does the patient have breastfeeding experience prior to this delivery?: No  Feeding Feeding Type: Breast Fed  LATCH Score Latch: Grasps breast easily, tongue down, lips flanged, rhythmical sucking.  Audible Swallowing: A few with stimulation  Type of Nipple: Everted at rest and after stimulation  Comfort (Breast/Nipple): Soft / non-tender  Hold (Positioning): No assistance needed to correctly position infant at breast.  LATCH Score: 9  Interventions Interventions: Breast feeding basics reviewed  Lactation Tools Discussed/Used     Consult Status Consult Status: Follow-up Date: 12/13/17 Follow-up type: In-patient    Ave Filter 12/12/2017, 5:28 PM

## 2017-12-12 NOTE — Anesthesia Preprocedure Evaluation (Signed)
Anesthesia Evaluation  Patient identified by MRN, date of birth, ID band Patient awake    Reviewed: Allergy & Precautions, H&P , NPO status , Patient's Chart, lab work & pertinent test results, reviewed documented beta blocker date and time   Airway Mallampati: II  TM Distance: >3 FB Neck ROM: full    Dental no notable dental hx.    Pulmonary neg pulmonary ROS, former smoker,    Pulmonary exam normal breath sounds clear to auscultation       Cardiovascular Exercise Tolerance: Good negative cardio ROS Normal cardiovascular exam Rhythm:regular Rate:Normal     Neuro/Psych negative neurological ROS  negative psych ROS   GI/Hepatic negative GI ROS, Neg liver ROS,   Endo/Other  negative endocrine ROS  Renal/GU negative Renal ROS  negative genitourinary   Musculoskeletal   Abdominal   Peds  Hematology negative hematology ROS (+)   Anesthesia Other Findings   Reproductive/Obstetrics negative OB ROS                             Anesthesia Physical Anesthesia Plan  ASA: II  Anesthesia Plan: Epidural   Post-op Pain Management:    Induction:   PONV Risk Score and Plan:   Airway Management Planned:   Additional Equipment:   Intra-op Plan:   Post-operative Plan:   Informed Consent: I have reviewed the patients History and Physical, chart, labs and discussed the procedure including the risks, benefits and alternatives for the proposed anesthesia with the patient or authorized representative who has indicated his/her understanding and acceptance.   Dental Advisory Given  Plan Discussed with: CRNA, Anesthesiologist and Surgeon  Anesthesia Plan Comments: (Labs checked- platelets confirmed with RN in room. Fetal heart tracing, per RN, reported to be stable enough for sitting procedure. Discussed epidural, and patient consents to the procedure:  included risk of possible headache,backache,  failed block, allergic reaction, and nerve injury. This patient was asked if she had any questions or concerns before the procedure started.)        Anesthesia Quick Evaluation

## 2017-12-13 LAB — CBC
HEMATOCRIT: 24.8 % — AB (ref 36.0–46.0)
Hemoglobin: 7.7 g/dL — ABNORMAL LOW (ref 12.0–15.0)
MCH: 21.8 pg — ABNORMAL LOW (ref 26.0–34.0)
MCHC: 31 g/dL (ref 30.0–36.0)
MCV: 70.1 fL — ABNORMAL LOW (ref 80.0–100.0)
NRBC: 0 % (ref 0.0–0.2)
PLATELETS: 256 10*3/uL (ref 150–400)
RBC: 3.54 MIL/uL — AB (ref 3.87–5.11)
RDW: 17.1 % — ABNORMAL HIGH (ref 11.5–15.5)
WBC: 14.5 10*3/uL — AB (ref 4.0–10.5)

## 2017-12-13 MED ORDER — FERROUS SULFATE 325 (65 FE) MG PO TABS
325.0000 mg | ORAL_TABLET | Freq: Every day | ORAL | Status: DC
  Administered 2017-12-13 – 2017-12-14 (×2): 325 mg via ORAL
  Filled 2017-12-13 (×2): qty 1

## 2017-12-13 NOTE — Anesthesia Postprocedure Evaluation (Signed)
Anesthesia Post Note  Patient: Kimberly Gibson  Procedure(s) Performed: AN AD [place]     Patient location during evaluation: Mother Baby Anesthesia Type: Epidural Level of consciousness: awake and alert Pain management: pain level controlled Vital Signs Assessment: post-procedure vital signs reviewed and stable Respiratory status: spontaneous breathing Cardiovascular status: stable Postop Assessment: no headache, patient able to bend at knees, no apparent nausea or vomiting, no backache, adequate PO intake, epidural receding and able to ambulate Anesthetic complications: no    Last Vitals:  Vitals:   12/13/17 0035 12/13/17 0433  BP: 110/74 106/63  Pulse: 84 81  Resp: 18 16  Temp: 36.6 C 36.8 C  SpO2: 100% 100%    Last Pain:  Vitals:   12/13/17 0532  TempSrc:   PainSc: 0-No pain   Pain Goal: Patients Stated Pain Goal: 4 (12/12/17 0749)               Ailene Ards

## 2017-12-13 NOTE — Progress Notes (Signed)
Patient ID: Kimberly Gibson, female   DOB: 1995/04/21, 22 y.o.   MRN: [id number] Pt doing well with no complaints. Bonding well with baby - breastfeeding. Lochia mod. Some cramping as expected with breastfeeding. No fever/chills/SOB/CP or HA VSS ABD - FF EXT - no homans or edema  14.5>7.7<256  A/P: PPD#2 s/p svd with anemia          Start on iron supp daily         Routine pp care         Discharge to home in am

## 2017-12-13 NOTE — Lactation Note (Signed)
This note was copied from a baby's chart. Lactation Consultation Note  Patient Name: Kimberly Gibson [id number] Date: 12/13/2017 Reason for consult: Follow-up assessment(per mom has been hand expressing , breastfeeding some and bottle feeding , baby recently fed a bottle )  Mom reconfirmed the above plan is what she plans to do.     Maternal Data    Feeding    LATCH Score                   Interventions Interventions: Breast feeding basics reviewed  Lactation Tools Discussed/Used     Consult Status Consult Status: PRN Date: 12/13/17 Follow-up type: In-patient    [place] 12/13/2017, 7:42 PM

## 2017-12-14 MED ORDER — IBUPROFEN 600 MG PO TABS: 600.0000 mg | ORAL_TABLET | Freq: Four times a day (QID) | ORAL | 0 refills | Status: DC

## 2017-12-14 MED ORDER — ACETAMINOPHEN 325 MG PO TABS: 650.0000 mg | ORAL_TABLET | ORAL | 1 refills | Status: DC | PRN

## 2017-12-14 NOTE — Progress Notes (Signed)
Post Partum Day 1 Subjective: no complaints, up ad lib and tolerating PO  Objective: Blood pressure 110/68, pulse 87, temperature 97.8 F (36.6 C), temperature source Oral, resp. rate 16, last menstrual period 02/07/2017, SpO2 100 %, unknown if currently breastfeeding.  Physical Exam:  General: alert and cooperative Lochia: appropriate Uterine Fundus: firm   Recent Labs    12/12/17 0755 12/13/17 0519  HGB 9.5* 7.7*  HCT 31.6* 24.8*    Assessment/Plan: Discharge home   LOS: 2 days   Logan Bores 12/14/2017, 9:20 AM

## 2017-12-14 NOTE — Discharge Summary (Signed)
OB Discharge Summary     Patient Name: Kimberly Gibson DOB: February 23, 1996 MRN: [id number]  Date of admission: 12/12/2017 Delivering MD: Janyth Contes   Date of discharge: 12/14/2017  Admitting diagnosis: 79 wks induction  Intrauterine pregnancy: [redacted]w[redacted]d     Secondary diagnosis:  Principal Problem:   SVD (spontaneous vaginal delivery) Active Problems:   Normal pregnancy in multigravida, antepartum   Normal pregnancy in multigravida in third trimester  Additional problems: none     Discharge diagnosis: Term Pregnancy Delivered                                                                                                Post partum procedures:none  Augmentation: AROM and Pitocin  Complications: None  Hospital course:  Induction of Labor With Vaginal Delivery   21 y.o. yo [id number] at [redacted]w[redacted]d was admitted to the hospital 12/12/2017 for induction of labor.  Indication for induction: Favorable cervix at term.  Patient had an uncomplicated labor course as follows: Membrane Rupture Time/Date: 9:43 AM ,12/12/2017   Intrapartum Procedures: Episiotomy: None [1]                                         Lacerations:  Periurethral [8]  Patient had delivery of a Viable infant.  Information for the patient's newborn:  Nefertiti, Mohamad [[id number]]  Delivery Method: Vaginal, Spontaneous(Filed from Delivery Summary)   12/12/2017  Details of delivery can be found in separate delivery note.  Patient had a routine postpartum course. Patient is discharged home 12/14/17.  Physical exam  Vitals:   12/13/17 0433 12/13/17 1334 12/13/17 2212 12/14/17 0521  BP: 106/63 121/78 131/64 110/68  Pulse: 81 94 84 87  Resp: 16 15 16 16   Temp: 98.3 F (36.8 C) 97.7 F (36.5 C) 97.6 F (36.4 C) 97.8 F (36.6 C)  TempSrc: Oral Oral Oral Oral  SpO2: 100% 100%     General: alert and cooperative Lochia: appropriate Uterine Fundus: firm  Labs: Lab Results  Component Value Date   WBC 14.5 (H)  12/13/2017   HGB 7.7 (L) 12/13/2017   HCT 24.8 (L) 12/13/2017   MCV 70.1 (L) 12/13/2017   PLT 256 12/13/2017   CMP Latest Ref Rng & Units 06/03/2017  Glucose 65 - 99 mg/dL 89  BUN 6 - 20 mg/dL 6  Creatinine 0.44 - 1.00 mg/dL 0.44  Sodium 135 - 145 mmol/L 135  Potassium 3.5 - 5.1 mmol/L 3.8  Chloride 101 - 111 mmol/L 106  CO2 22 - 32 mmol/L 19(L)  Calcium 8.9 - 10.3 mg/dL 9.2  Total Protein 6.5 - 8.1 g/dL 6.6  Total Bilirubin 0.3 - 1.2 mg/dL 1.0  Alkaline Phos 38 - 126 U/L 41  AST 15 - 41 U/L 20  ALT 14 - 54 U/L 13(L)    Discharge instruction: per After Visit Summary and "Baby and Me Booklet".  After visit meds:  Allergies as of 12/14/2017   No Known Allergies     Medication List  TAKE these medications   acetaminophen 325 MG tablet Commonly known as:  TYLENOL Take 2 tablets (650 mg total) by mouth every 4 (four) hours as needed (for pain scale < 4).   cyclobenzaprine 10 MG tablet Commonly known as:  FLEXERIL Take 1 tablet (10 mg total) by mouth 2 (two) times daily as needed for muscle spasms.   ibuprofen 600 MG tablet Commonly known as:  ADVIL,MOTRIN Take 1 tablet (600 mg total) by mouth every 6 (six) hours.   prenatal multivitamin Tabs tablet Take 1 tablet by mouth daily at 12 noon.       Diet: routine diet  Activity: Advance as tolerated. Pelvic rest for 6 weeks.   Outpatient follow up:6 weeks Follow up Appt:No future appointments. Follow up Visit:No follow-ups on file.  Postpartum contraception: Nexplanon  Newborn Data: Live born female  Birth Weight: 7 lb 13 oz (3545 g) APGAR: 68, 9  Newborn Delivery   Birth date/time:  12/12/2017 12:47:00 Delivery type:  Vaginal, Spontaneous     Baby Feeding: Bottle and Breast Disposition:home with mother   12/14/2017 Logan Bores, MD

## 2017-12-14 NOTE — Lactation Note (Signed)
This note was copied from a baby's chart. Lactation Consultation Note  Patient Name: Kimberly Gibson [id number]'[id number] Date: 12/14/2017 Reason for consult: Follow-up assessment;Term P2, 23 hr female with 3% weight loss LC entered room mom holding infant in bed. Per mom, BF is going well, she last feed infant at 11:30 pm offering 15 ml of EBM and 12ml of formula in bottle. Per mom,  she is latching infant to breast w/out problems. Her current choice is to BF infant at breast, pump and supplement with formula.  Mom current feeding plan is  alternating between feeding infant at breast, formula supplementation and giving EBM  Mom's  Feeding Plan at home: 1. Possibly BF infant 8 times per day at breast, formula maybe twice daily and occassionally offer pumped breast milk  within 24 hours. Maternal Data    Feeding Feeding Type: Breast Milk with Formula added Nipple Type: Slow - flow  LATCH Score                   Interventions    Lactation Tools Discussed/Used     Consult Status Consult Status: Follow-up Date: 12/14/17 Follow-up type: In-patient    Kimberly Gibson 12/14/2017, 12:53 AM
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
## 2018-06-08 ENCOUNTER — Encounter (HOSPITAL_COMMUNITY): Payer: Self-pay | Admitting: Emergency Medicine

## 2018-06-08 ENCOUNTER — Other Ambulatory Visit: Payer: Self-pay

## 2018-06-08 ENCOUNTER — Emergency Department (HOSPITAL_COMMUNITY): Payer: Medicaid Other

## 2018-06-08 ENCOUNTER — Emergency Department (HOSPITAL_COMMUNITY)
Admission: EM | Admit: 2018-06-08 | Discharge: 2018-06-08 | Disposition: A | Discharge status: Home / Self Care | Payer: Medicaid Other | Attending: Emergency Medicine | Admitting: Emergency Medicine

## 2018-06-08 DIAGNOSIS — Y9289 Other specified places as the place of occurrence of the external cause: Secondary | ICD-10-CM | POA: Insufficient documentation

## 2018-06-08 DIAGNOSIS — W108XXA Fall (on) (from) other stairs and steps, initial encounter: Secondary | ICD-10-CM | POA: Insufficient documentation

## 2018-06-08 DIAGNOSIS — Y9389 Activity, other specified: Secondary | ICD-10-CM | POA: Insufficient documentation

## 2018-06-08 DIAGNOSIS — M25562 Pain in left knee: Secondary | ICD-10-CM

## 2018-06-08 DIAGNOSIS — Z79899 Other long term (current) drug therapy: Secondary | ICD-10-CM | POA: Insufficient documentation

## 2018-06-08 DIAGNOSIS — Y999 Unspecified external cause status: Secondary | ICD-10-CM | POA: Insufficient documentation

## 2018-06-08 DIAGNOSIS — Z87891 Personal history of nicotine dependence: Secondary | ICD-10-CM | POA: Insufficient documentation

## 2018-06-08 DIAGNOSIS — R2242 Localized swelling, mass and lump, left lower limb: Secondary | ICD-10-CM | POA: Insufficient documentation

## 2018-06-08 MED ORDER — IBUPROFEN 600 MG PO TABS: 600.0000 mg | ORAL_TABLET | Freq: Four times a day (QID) | ORAL | 0 refills | Status: DC

## 2018-06-08 MED ORDER — IBUPROFEN 200 MG PO TABS
600.0000 mg | ORAL_TABLET | Freq: Once | ORAL | Status: AC
  Administered 2018-06-08: 600 mg via ORAL
  Filled 2018-06-08: qty 3

## 2018-06-08 NOTE — ED Provider Notes (Signed)
[facility] DEPT Provider Note   CSN: [id number] Arrival date & time: 06/08/18  2212    History   Chief Complaint Chief Complaint  Patient presents with  . Knee Pain    HPI Kimberly Gibson is a 23 y.o. female.     Patient to ED after fall while walking down a flight of stairs. She slipped down the last 7 carpeted steps, landing on her feet, causing sharp, severe left knee pain in the posterior portion. No pain above or below the injury. She states she has been unable to bear any weight. No history of pain or injury to this knee.   The history is provided by the patient. No language interpreter was used.  Knee Pain    Past Medical History:  Diagnosis Date  . BV (bacterial vaginosis)   . GERD (gastroesophageal reflux disease)   . Normal pregnancy in multigravida, antepartum 12/11/2017  . SVD (spontaneous vaginal delivery) 12/12/2017    Patient Active Problem List   Diagnosis Date Noted  . Normal pregnancy in multigravida in third trimester 12/12/2017  . SVD (spontaneous vaginal delivery) 12/12/2017  . Normal pregnancy in multigravida, antepartum 12/11/2017  . No leakage of amniotic fluid into vagina 11/27/2017  . Adjustment disorder with mixed anxiety and depressed mood 08/28/2015  . GERD (gastroesophageal reflux disease) 08/27/2015  . Overdose of iron or iron compound 08/27/2015  . Microcytic anemia 08/27/2015  . Normal pregnancy, first 09/18/2013    Past Surgical History:  Procedure Laterality Date  . WISDOM TOOTH EXTRACTION       OB History    Gravida  3   Para  2   Term  2   Preterm  0   AB  1   Living  2     SAB  1   TAB  0   Ectopic  0   Multiple  0   Live Births  2            Home Medications    Prior to Admission medications   Medication Sig Start Date End Date Taking? Authorizing Provider  acetaminophen (TYLENOL) 325 MG tablet Take 2 tablets (650 mg total) by mouth every 4 (four) hours as needed  (for pain scale < 4). 12/14/17   Paula Compton, MD  cyclobenzaprine (FLEXERIL) 10 MG tablet Take 1 tablet (10 mg total) by mouth 2 (two) times daily as needed for muscle spasms. 12/01/17   Lajean Manes, CNM  ibuprofen (ADVIL,MOTRIN) 600 MG tablet Take 1 tablet (600 mg total) by mouth every 6 (six) hours. 12/14/17   Paula Compton, MD  Prenatal Vit-Fe Fumarate-FA (PRENATAL MULTIVITAMIN) TABS tablet Take 1 tablet by mouth daily at 12 noon.    [provider]    Family History Family History  Problem Relation Age of Onset  . Asthma Sister   . Hearing loss Neg Hx     Social History Social History   Tobacco Use  . Smoking status: Former Research scientist (life sciences)  . Smokeless tobacco: Never Used  . Tobacco comment: quit 2011  Substance Use Topics  . Alcohol use: No  . Drug use: No     Allergies   Patient has no known allergies.   Review of Systems Review of Systems  Musculoskeletal:       See HPI.  Skin: Negative for color change and wound.  Neurological: Negative for weakness and numbness.     Physical Exam Updated Vital Signs BP 115/74 (BP Location: Right  Arm)   Pulse 75   Temp (!) 97 F (36.1 C) (Oral)   Resp 18   Ht 5\' 5"  (1.651 m)   Wt 59 kg   LMP 05/20/2018   SpO2 100%   BMI 21.63 kg/m   Physical Exam Vitals signs and nursing note reviewed.  Constitutional:      Appearance: She is well-developed.  Neck:     Musculoskeletal: Normal range of motion.  Cardiovascular:     Pulses: Normal pulses.  Pulmonary:     Effort: Pulmonary effort is normal.  Musculoskeletal:     Comments: Left knee has minimal swelling, no bony deformity or discoloration. Tender posterolateral aspect. No thigh or calf tenderness. FROM hip and ankle. No patellar tenderness. Knee joint is stable.   Skin:    General: Skin is warm and dry.  Neurological:     Mental Status: She is alert and oriented to person, place, and time.     Sensory: No sensory deficit.      ED Treatments  / Results  Labs (all labs ordered are listed, but only abnormal results are displayed) Labs Reviewed - No data to display  EKG None  Radiology Dg Knee Complete 4 Views Left  Result Date: 06/08/2018 CLINICAL DATA:  Left knee pain after fall down steps. EXAM: LEFT KNEE - COMPLETE 4+ VIEW COMPARISON:  None. FINDINGS: No evidence of fracture, dislocation, or joint effusion. No evidence of arthropathy or other focal bone abnormality. Soft tissues are unremarkable. IMPRESSION: Negative radiographs of the left knee. Electronically Signed   By: Keith Rake M.D.   On: 06/08/2018 22:50    Procedures Procedures (including critical care time)  Medications Ordered in ED Medications  ibuprofen (ADVIL,MOTRIN) tablet 600 mg (has no administration in time range)     Initial Impression / Assessment and Plan / ED Course  I have reviewed the triage vital signs and the nursing notes.  Pertinent labs & imaging results that were available during my care of the patient were reviewed by me and considered in my medical decision making (see chart for details).        Patient to ED after slipping on the last seven of a flight of carpeted steps, landing in a standing position, with severe left knee pain, unable to ambulate since.   No significant exam findings - minimal swelling. Joint stable. X-ray negative for any fracture or dislocation.   Patient started on ibuprofen. Knee immobilizer and crutches provided. Will provide orthopedic referral if pain is no better in 3-4 days.   Final Clinical Impressions(s) / ED Diagnoses   Final diagnoses:  None   1. Left knee pain 2. Fall  ED Discharge Orders    None       Dennie Bible 06/08/18 2312    Davonna Belling, MD 06/08/18 [phone]

## 2018-06-08 NOTE — ED Triage Notes (Signed)
Pt presents by [facility] for evaluation of left knee injury after falling down seven steps. Pt denies any head injury or LOC. Pt reports pain to posterior knee with pulses present distally.

## 2018-06-08 NOTE — ED Notes (Signed)
Bed: WA09 Expected date:  Expected time:  Means of arrival:  Comments: EMS 23 yo female fell down 7 steps-leg injury with swelling and bruising around knee

## 2018-06-08 NOTE — Discharge Instructions (Signed)
Wear the knee brace and use the crutches to be weight bearing as needed. Ice and elevate the knee to further reduce swelling. Your knee pain should improve over the next 3-4 days. If you have persistent pain, please call Dr. Lorin Mercy for an appointment and further outpatient evaluation.

## 2018-12-07 ENCOUNTER — Emergency Department (HOSPITAL_COMMUNITY)
Admission: EM | Admit: 2018-12-07 | Discharge: 2018-12-07 | Disposition: A | Discharge status: Home / Self Care | Payer: Medicaid Other | Attending: Emergency Medicine | Admitting: Emergency Medicine

## 2018-12-07 ENCOUNTER — Encounter (HOSPITAL_COMMUNITY): Payer: Self-pay | Admitting: Emergency Medicine

## 2018-12-07 ENCOUNTER — Other Ambulatory Visit: Payer: Self-pay

## 2018-12-07 DIAGNOSIS — Z79899 Other long term (current) drug therapy: Secondary | ICD-10-CM | POA: Insufficient documentation

## 2018-12-07 DIAGNOSIS — R519 Headache, unspecified: Secondary | ICD-10-CM | POA: Insufficient documentation

## 2018-12-07 DIAGNOSIS — Z87891 Personal history of nicotine dependence: Secondary | ICD-10-CM | POA: Insufficient documentation

## 2018-12-07 LAB — POC URINE PREG, ED: Preg Test, Ur: NEGATIVE

## 2018-12-07 MED ORDER — KETOROLAC TROMETHAMINE 30 MG/ML IJ SOLN
30.0000 mg | Freq: Once | INTRAMUSCULAR | Status: AC
  Administered 2018-12-07: 30 mg via INTRAVENOUS
  Filled 2018-12-07: qty 1

## 2018-12-07 MED ORDER — DIPHENHYDRAMINE HCL 50 MG/ML IJ SOLN
25.0000 mg | Freq: Once | INTRAMUSCULAR | Status: AC
  Administered 2018-12-07: 25 mg via INTRAVENOUS
  Filled 2018-12-07: qty 1

## 2018-12-07 MED ORDER — METOCLOPRAMIDE HCL 5 MG/ML IJ SOLN
10.0000 mg | Freq: Once | INTRAMUSCULAR | Status: AC
  Administered 2018-12-07: 10 mg via INTRAVENOUS
  Filled 2018-12-07: qty 2

## 2018-12-07 NOTE — ED Provider Notes (Signed)
[facility] DEPART[facility] Provider Note   CSN: [id number] Arrival date & time: 12/07/18  [id number]     History   Chief Complaint Chief Complaint  Patient presents with  . Headache    HPI Kimberly Gibson is a 23 y.o. female.     The history is provided by the patient. No language interpreter was used.  Headache    23 year old female presenting for evaluation of headache.  Patient report for the past 2 days she has had a gradual onset of left-sided headache.  She describes headache as a throbbing sensation, persistent, moderate in severity and rates at 6 out of 10.  She also endorse feeling nauseous and vomited earlier today.  She denies any associated fever chills no lightheadedness or dizziness, no URI symptoms, no light or sound sensitivity, no neck pain, focal numbness or weakness of rash.  Denies abdominal pain constipation or diarrhea.  No specific treatment tried at home.  She does not normally have headaches.  She denies any recent sickness or increasing stress.  Last menstrual period was a month ago.  Past Medical History:  Diagnosis Date  . BV (bacterial vaginosis)   . GERD (gastroesophageal reflux disease)   . Normal pregnancy in multigravida, antepartum 12/11/2017  . SVD (spontaneous vaginal delivery) 12/12/2017    Patient Active Problem List   Diagnosis Date Noted  . Normal pregnancy in multigravida in third trimester 12/12/2017  . SVD (spontaneous vaginal delivery) 12/12/2017  . Normal pregnancy in multigravida, antepartum 12/11/2017  . No leakage of amniotic fluid into vagina 11/27/2017  . Adjustment disorder with mixed anxiety and depressed mood 08/28/2015  . GERD (gastroesophageal reflux disease) 08/27/2015  . Overdose of iron or iron compound 08/27/2015  . Microcytic anemia 08/27/2015  . Normal pregnancy, first 09/18/2013    Past Surgical History:  Procedure Laterality Date  . WISDOM TOOTH EXTRACTION       OB History    Gravida   3   Para  2   Term  2   Preterm  0   AB  1   Living  2     SAB  1   TAB  0   Ectopic  0   Multiple  0   Live Births  2            Home Medications    Prior to Admission medications   Medication Sig Start Date End Date Taking? Authorizing Provider  acetaminophen (TYLENOL) 325 MG tablet Take 2 tablets (650 mg total) by mouth every 4 (four) hours as needed (for pain scale < 4). 12/14/17   Paula Compton, MD  cyclobenzaprine (FLEXERIL) 10 MG tablet Take 1 tablet (10 mg total) by mouth 2 (two) times daily as needed for muscle spasms. 12/01/17   Lajean Manes, CNM  ibuprofen (ADVIL,MOTRIN) 600 MG tablet Take 1 tablet (600 mg total) by mouth every 6 (six) hours. 06/08/18   Charlann Lange, PA-C  Prenatal Vit-Fe Fumarate-FA (PRENATAL MULTIVITAMIN) TABS tablet Take 1 tablet by mouth daily at 12 noon.    [provider]    Family History Family History  Problem Relation Age of Onset  . Asthma Sister   . Hearing loss Neg Hx     Social History Social History   Tobacco Use  . Smoking status: Former Research scientist (life sciences)  . Smokeless tobacco: Never Used  . Tobacco comment: quit 2011  Substance Use Topics  . Alcohol use: No  . Drug use: No  Allergies   Patient has no known allergies.   Review of Systems Review of Systems  Neurological: Positive for headaches.  All other systems reviewed and are negative.    Physical Exam Updated Vital Signs BP 127/84 (BP Location: Right Arm)   Pulse 91   Temp 98 F (36.7 C) (Oral)   Resp 16   SpO2 100%   Physical Exam Vitals signs and nursing note reviewed.  Constitutional:      General: She is not in acute distress.    Appearance: She is well-developed.  HENT:     Head: Atraumatic.     Mouth/Throat:     Mouth: Mucous membranes are moist.  Eyes:     Extraocular Movements: Extraocular movements intact.     Conjunctiva/sclera: Conjunctivae normal.     Pupils: Pupils are equal, round, and reactive to light.   Neck:     Musculoskeletal: Neck supple. No neck rigidity.  Cardiovascular:     Rate and Rhythm: Normal rate and regular rhythm.     Heart sounds: No murmur.  Pulmonary:     Effort: Pulmonary effort is normal.     Breath sounds: Normal breath sounds.  Abdominal:     General: Bowel sounds are normal.     Palpations: Abdomen is soft.     Tenderness: There is no abdominal tenderness.  Musculoskeletal: Normal range of motion.  Skin:    Findings: No rash.  Neurological:     Mental Status: She is alert and oriented to person, place, and time.     GCS: GCS eye subscore is 4. GCS verbal subscore is 5. GCS motor subscore is 6.     Cranial Nerves: No cranial nerve deficit.     Sensory: No sensory deficit.     Deep Tendon Reflexes: Reflexes normal.  Psychiatric:        Mood and Affect: Mood normal.      ED Treatments / Results  Labs (all labs ordered are listed, but only abnormal results are displayed) Labs Reviewed  POC URINE PREG, ED    EKG None  Radiology No results found.  Procedures Procedures (including critical care time)  Medications Ordered in ED Medications  ketorolac (TORADOL) 30 MG/ML injection 30 mg (30 mg Intravenous Given 12/07/18 1014)  metoCLOPramide (REGLAN) injection 10 mg (10 mg Intravenous Given 12/07/18 1014)  diphenhydrAMINE (BENADRYL) injection 25 mg (25 mg Intravenous Given 12/07/18 1014)     Initial Impression / Assessment and Plan / ED Course  I have reviewed the triage vital signs and the nursing notes.  Pertinent labs & imaging results that were available during my care of the patient were reviewed by me and considered in my medical decision making (see chart for details).        BP (!) 105/56   Pulse 72   Temp 98 F (36.7 C) (Oral)   Resp 16   SpO2 100%    Final Clinical Impressions(s) / ED Diagnoses   Final diagnoses:  Bad headache    ED Discharge Orders    None     9:11 AM Pt here with headache.  She is well  appearing.  No red flags.  Dout meningitis, SAH, or stroke.  Will provide treatment.   11:25 AM Patient reported improvement of her symptoms.  She is stable for discharge.  Patient made aware that the medication may cause drowsiness.  Return caution discussed.   Domenic Moras, PA-C 12/07/18 1125    Charlesetta Shanks, MD 12/07/18 1410

## 2018-12-07 NOTE — ED Triage Notes (Signed)
Pt endorses a HA for 2 days with no relief with advil. Also states she has not been able to keep anything down due to nausea.

## 2018-12-07 NOTE — ED Notes (Signed)
Patient verbalizes understanding of discharge instructions. Opportunity for questioning and answers were provided. Armband removed by staff, pt discharged from ED.  

## 2019-01-25 ENCOUNTER — Other Ambulatory Visit: Payer: Self-pay

## 2019-01-25 ENCOUNTER — Encounter (HOSPITAL_COMMUNITY): Payer: Self-pay | Admitting: Emergency Medicine

## 2019-01-25 ENCOUNTER — Emergency Department (HOSPITAL_COMMUNITY)
Admission: EM | Admit: 2019-01-25 | Discharge: 2019-01-25 | Disposition: A | Discharge status: Home / Self Care | Payer: Medicaid Other | Attending: Emergency Medicine | Admitting: Emergency Medicine

## 2019-01-25 DIAGNOSIS — K922 Gastrointestinal hemorrhage, unspecified: Secondary | ICD-10-CM | POA: Diagnosis not present

## 2019-01-25 DIAGNOSIS — D509 Iron deficiency anemia, unspecified: Secondary | ICD-10-CM | POA: Diagnosis not present

## 2019-01-25 DIAGNOSIS — K649 Unspecified hemorrhoids: Secondary | ICD-10-CM | POA: Insufficient documentation

## 2019-01-25 DIAGNOSIS — Z79899 Other long term (current) drug therapy: Secondary | ICD-10-CM | POA: Diagnosis not present

## 2019-01-25 DIAGNOSIS — Z87891 Personal history of nicotine dependence: Secondary | ICD-10-CM | POA: Diagnosis not present

## 2019-01-25 DIAGNOSIS — K625 Hemorrhage of anus and rectum: Secondary | ICD-10-CM | POA: Diagnosis present

## 2019-01-25 LAB — CBC
HCT: 25.7 % — ABNORMAL LOW (ref 36.0–46.0)
Hemoglobin: 7 g/dL — ABNORMAL LOW (ref 12.0–15.0)
MCH: 18.3 pg — ABNORMAL LOW (ref 26.0–34.0)
MCHC: 27.2 g/dL — ABNORMAL LOW (ref 30.0–36.0)
MCV: 67.1 fL — ABNORMAL LOW (ref 80.0–100.0)
Platelets: 369 10*3/uL (ref 150–400)
RBC: 3.83 MIL/uL — ABNORMAL LOW (ref 3.87–5.11)
RDW: 18.7 % — ABNORMAL HIGH (ref 11.5–15.5)
WBC: 7 10*3/uL (ref 4.0–10.5)
nRBC: 0 % (ref 0.0–0.2)

## 2019-01-25 LAB — COMPREHENSIVE METABOLIC PANEL
ALT: 11 U/L (ref 0–44)
AST: 19 U/L (ref 15–41)
Albumin: 3.8 g/dL (ref 3.5–5.0)
Alkaline Phosphatase: 38 U/L (ref 38–126)
Anion gap: 8 (ref 5–15)
BUN: 10 mg/dL (ref 6–20)
CO2: 23 mmol/L (ref 22–32)
Calcium: 9.1 mg/dL (ref 8.9–10.3)
Chloride: 108 mmol/L (ref 98–111)
Creatinine, Ser: 0.66 mg/dL (ref 0.44–1.00)
GFR calc Af Amer: >60 mL/min (ref >60)
GFR calc non Af Amer: >60 mL/min (ref >60)
Glucose, Bld: 91 mg/dL (ref 70–99)
Potassium: 3.3 mmol/L — ABNORMAL LOW (ref 3.5–5.1)
Sodium: 139 mmol/L (ref 135–145)
Total Bilirubin: 0.8 mg/dL (ref 0.3–1.2)
Total Protein: 7.4 g/dL (ref 6.5–8.1)

## 2019-01-25 LAB — I-STAT BETA HCG BLOOD, ED (MC, WL, AP ONLY): I-stat hCG, quantitative: <5 m[IU]/mL (ref <5)

## 2019-01-25 LAB — POC OCCULT BLOOD, ED: Fecal Occult Bld: NEGATIVE

## 2019-01-25 MED ORDER — HYDROCORTISONE ACETATE 25 MG RE SUPP: 25.0000 mg | Freq: Two times a day (BID) | RECTAL | 0 refills | Status: DC

## 2019-01-25 NOTE — ED Triage Notes (Signed)
Pt here for eval of bleeding from rectum. States she thought it was a hemorrhoid and was using hemorroid cream but then was having bright red blood filling in the toilet.

## 2019-01-25 NOTE — ED Provider Notes (Signed)
[facility] EM[facility]ENC[facility] DEPARTMENT Provider Note   CSN: [id number] Arrival date & time: 01/25/19  1725     History   Chief Complaint Chief Complaint  Patient presents with  . GI Bleeding    HPI Kimberly Gibson is a 23 y.o. female.     HPI   She complains of bleeding with stools and hesitancy to have abnormal because of pain in her hands for several days.  She has seen blood with wiping and blood in the commode.  She reports intermittently having menstrual cycles; not currently bleeding.  She denies fever, chills, cough, shortness of breath, abdominal or chest pain.  History of anemia.  She takes iron tablets sporadically once or twice a week.  No recent medical care.  She works as a Materials engineer.  There are no other known modifying factors.  Past Medical History:  Diagnosis Date  . BV (bacterial vaginosis)   . GERD (gastroesophageal reflux disease)   . Normal pregnancy in multigravida, antepartum 12/11/2017  . SVD (spontaneous vaginal delivery) 12/12/2017    Patient Active Problem List   Diagnosis Date Noted  . Normal pregnancy in multigravida in third trimester 12/12/2017  . SVD (spontaneous vaginal delivery) 12/12/2017  . Normal pregnancy in multigravida, antepartum 12/11/2017  . No leakage of amniotic fluid into vagina 11/27/2017  . Adjustment disorder with mixed anxiety and depressed mood 08/28/2015  . GERD (gastroesophageal reflux disease) 08/27/2015  . Overdose of iron or iron compound 08/27/2015  . Microcytic anemia 08/27/2015  . Normal pregnancy, first 09/18/2013    Past Surgical History:  Procedure Laterality Date  . WISDOM TOOTH EXTRACTION       OB History    Gravida  3   Para  2   Term  2   Preterm  0   AB  1   Living  2     SAB  1   TAB  0   Ectopic  0   Multiple  0   Live Births  2            Home Medications    Prior to Admission medications   Medication Sig Start Date End Date Taking? Authorizing Provider   acetaminophen (TYLENOL) 325 MG tablet Take 2 tablets (650 mg total) by mouth every 4 (four) hours as needed (for pain scale < 4). 12/14/17   Paula Compton, MD  cyclobenzaprine (FLEXERIL) 10 MG tablet Take 1 tablet (10 mg total) by mouth 2 (two) times daily as needed for muscle spasms. 12/01/17   Lajean Manes, CNM  hydrocortisone (ANUSOL-HC) 25 MG suppository Place 1 suppository (25 mg total) rectally 2 (two) times daily. For 7 days 01/25/19   Daleen Bo, MD  ibuprofen (ADVIL,MOTRIN) 600 MG tablet Take 1 tablet (600 mg total) by mouth every 6 (six) hours. 06/08/18   Charlann Lange, PA-C  Prenatal Vit-Fe Fumarate-FA (PRENATAL MULTIVITAMIN) TABS tablet Take 1 tablet by mouth daily at 12 noon.    [provider]    Family History Family History  Problem Relation Age of Onset  . Asthma Sister   . Hearing loss Neg Hx     Social History Social History   Tobacco Use  . Smoking status: Former Research scientist (life sciences)  . Smokeless tobacco: Never Used  . Tobacco comment: quit 2011  Substance Use Topics  . Alcohol use: No  . Drug use: No     Allergies   Patient has no known allergies.   Review of Systems  Review of Systems  All other systems reviewed and are negative.    Physical Exam Updated Vital Signs BP 120/72 (BP Location: Left Arm)   Pulse 93   Temp 98.1 F (36.7 C) (Oral)   Resp 14   LMP 01/01/2019   SpO2 94%   Physical Exam Vitals signs and nursing note reviewed.  Constitutional:      General: She is not in acute distress.    Appearance: She is well-developed. She is not ill-appearing, toxic-appearing or diaphoretic.  HENT:     Head: Normocephalic and atraumatic.     Right Ear: External ear normal.     Left Ear: External ear normal.  Eyes:     Conjunctiva/sclera: Conjunctivae normal.     Pupils: Pupils are equal, round, and reactive to light.  Neck:     Musculoskeletal: Normal range of motion and neck supple.     Trachea: Phonation normal.  Cardiovascular:      Rate and Rhythm: Normal rate and regular rhythm.     Heart sounds: Normal heart sounds.  Pulmonary:     Effort: Pulmonary effort is normal.     Breath sounds: Normal breath sounds.  Abdominal:     General: There is no distension.     Palpations: Abdomen is soft.     Tenderness: There is no abdominal tenderness.  Genitourinary:    Comments: Small nonthrombosed external hemorrhoid, anterior.  No visible external anal fissure.  On digital rectal examination she has moderate pain, without palpable abnormality including anal ridge or internal hemorrhoid.  No rectal mass.  No stool in rectal vault.  Mucus sent for Hemoccult testing. Musculoskeletal: Normal range of motion.  Skin:    General: Skin is warm and dry.  Neurological:     Mental Status: She is alert and oriented to person, place, and time.     Cranial Nerves: No cranial nerve deficit.     Sensory: No sensory deficit.     Motor: No abnormal muscle tone.     Coordination: Coordination normal.  Psychiatric:        Behavior: Behavior normal.        Thought Content: Thought content normal.        Judgment: Judgment normal.      ED Treatments / Results  Labs (all labs ordered are listed, but only abnormal results are displayed) Labs Reviewed  COMPREHENSIVE METABOLIC PANEL - Abnormal; Notable for the following components:      Result Value   Potassium 3.3 (*)    All other components within normal limits  CBC - Abnormal; Notable for the following components:   RBC 3.83 (*)    Hemoglobin 7.0 (*)    HCT 25.7 (*)    MCV 67.1 (*)    MCH 18.3 (*)    MCHC 27.2 (*)    RDW 18.7 (*)    All other components within normal limits  POC OCCULT BLOOD, ED  I-STAT BETA HCG BLOOD, ED ([facility], [facility], [facility] ONLY)    EKG None  Radiology No results found.  Procedures Procedures (including critical care time)  Medications Ordered in ED Medications - No data to display   Initial Impression / Assessment and Plan / ED Course  I have  reviewed the triage vital signs and the nursing notes.  Pertinent labs & imaging results that were available during my care of the patient were reviewed by me and considered in my medical decision making (see chart for details).  Clinical Course as  of Jan 25 1952  Sat Jan 25, 2019  1940 Normal  I-Stat beta hCG blood, ED [EW]  1940 Normal except hemoglobin low, MCV low  CBC(!) [EW]  1940 Normal except potassium low  Comprehensive metabolic panel(!) [EW]    Clinical Course User Index [EW] Daleen Bo, MD        Patient Vitals for the past 24 hrs:  BP Temp Temp src Pulse Resp SpO2  01/25/19 1728 120/72 98.1 F (36.7 C) Oral 93 14 94 %    7:53 PM Reevaluation with update and discussion. After initial assessment and treatment, an updated evaluation reveals no change in clinical status, findings discussed with the patient and all questions were answered. Daleen Bo   Medical Decision Making: Rectal bleeding, possibly from hemorrhoid.  No internal rectal mass.  Hemodynamically stable without signs and symptoms of volume depletion.  Patient with historical iron deficiency anemia, now with very low MCV.  She is currently taking inadequate dose of iron to improve MCV.  Doubt acute blood loss.  She is stable for discharge with outpatient management.  CRITICAL CARE-no Performed by: Daleen Bo  Nursing Notes Reviewed/ Care Coordinated Applicable Imaging Reviewed Interpretation of Laboratory Data incorporated into ED treatment  The patient appears reasonably screened and/or stabilized for discharge and I doubt any other medical condition or other [facility] requiring further screening, evaluation, or treatment in the ED at this time prior to discharge.  Plan: Home Medications-OTC analgesia of choice; Home Treatments-warm soaks; return here if the recommended treatment, does not improve the symptoms; Recommended follow up-PCP, as needed have blood count checked in 1 or 2 weeks.  Final  Clinical Impressions(s) / ED Diagnoses   Final diagnoses:  Gastrointestinal hemorrhage, unspecified gastrointestinal hemorrhage type  Hemorrhoids, unspecified hemorrhoid type    ED Discharge Orders         Ordered    hydrocortisone (ANUSOL-HC) 25 MG suppository  2 times daily     01/25/19 1951           Daleen Bo, MD 01/25/19 1955

## 2019-01-25 NOTE — Discharge Instructions (Signed)
You have a hemorrhoid that may be causing your pain and bleeding.  You may also have an anal fissure but it could not be seen today.  Your blood count is low at 7.  Last year it was 7.7.  Your iron count is very low.  Your blood count will improve, by taking iron tablets.  Take 1 iron pill 325 mg, twice a day for 2 to 3 months until your hemoglobin level is normal.  Also take a stool softener like Colace, 100 mg twice a day while you are taking the iron tablets.  Follow-up with your primary care doctor for a repeat blood count in further care in 1 or 2 weeks.  Use the prescription suppository for pain relief, for 1 week.  Also soak in a warm tub 2 or 3 times a day to help your discomfort.

## 2019-01-25 NOTE — ED Notes (Signed)
Patient Alert and oriented to baseline. Stable and ambulatory to baseline. Patient verbalized understanding of the discharge instructions.  Patient belongings were taken by the patient.   

## 2019-05-31 ENCOUNTER — Ambulatory Visit (HOSPITAL_COMMUNITY)
Admission: EM | Admit: 2019-05-31 | Discharge: 2019-05-31 | Disposition: A | Discharge status: Home / Self Care | Payer: Medicaid Other | Attending: Family Medicine | Admitting: Family Medicine

## 2019-05-31 ENCOUNTER — Encounter (HOSPITAL_COMMUNITY): Payer: Self-pay

## 2019-05-31 ENCOUNTER — Other Ambulatory Visit: Payer: Self-pay

## 2019-05-31 DIAGNOSIS — N912 Amenorrhea, unspecified: Secondary | ICD-10-CM | POA: Diagnosis present

## 2019-05-31 DIAGNOSIS — R110 Nausea: Secondary | ICD-10-CM | POA: Diagnosis not present

## 2019-05-31 DIAGNOSIS — Z3202 Encounter for pregnancy test, result negative: Secondary | ICD-10-CM | POA: Insufficient documentation

## 2019-05-31 DIAGNOSIS — N898 Other specified noninflammatory disorders of vagina: Secondary | ICD-10-CM | POA: Insufficient documentation

## 2019-05-31 DIAGNOSIS — Z3200 Encounter for pregnancy test, result unknown: Secondary | ICD-10-CM | POA: Diagnosis present

## 2019-05-31 LAB — POC URINE PREG, ED: Preg Test, Ur: NEGATIVE

## 2019-05-31 LAB — POCT PREGNANCY, URINE: Preg Test, Ur: NEGATIVE

## 2019-05-31 LAB — HCG, QUANTITATIVE, PREGNANCY: hCG, Beta Chain, Quant, S: <1 m[IU]/mL (ref <5)

## 2019-05-31 NOTE — ED Provider Notes (Signed)
[facility]    CSN: [id number] Arrival date & time: 05/31/19  1358      History   Chief Complaint Chief Complaint  Patient presents with  . Possible Pregnancy    HPI Kimberly Gibson is a 24 y.o. female.   Patient reports that she is 5 days late for her period.  Also states clear, thicker than usual vaginal discharge that has since resolved over the last week.  Denies dysuria, urinary urgency, frequency, hematuria.  Denies abdominal pain.  Denies vaginal odor, vaginal itching, bleeding.  Reports that this is occurred before, that she had a negative urine pregnancy test at home.  States that with her last child, she had a negative urine pregnancy and positive serum hCG.  Reports that her periods are normally on time "like clockwork."  Patient reports that she does have established OB care with Dr. Janyth Contes.  Upon chart review, patient has history of GERD and BV.  Denies headache, sore throat, shortness of breath, cough, nausea, vomiting, diarrhea, chills, body aches, rash, fever, other symptoms.  ROS Per HPI   The history is provided by the patient.    Past Medical History:  Diagnosis Date  . BV (bacterial vaginosis)   . GERD (gastroesophageal reflux disease)   . Normal pregnancy in multigravida, antepartum 12/11/2017  . SVD (spontaneous vaginal delivery) 12/12/2017    Patient Active Problem List   Diagnosis Date Noted  . Normal pregnancy in multigravida in third trimester 12/12/2017  . SVD (spontaneous vaginal delivery) 12/12/2017  . Normal pregnancy in multigravida, antepartum 12/11/2017  . No leakage of amniotic fluid into vagina 11/27/2017  . Adjustment disorder with mixed anxiety and depressed mood 08/28/2015  . GERD (gastroesophageal reflux disease) 08/27/2015  . Overdose of iron or iron compound 08/27/2015  . Microcytic anemia 08/27/2015  . Normal pregnancy, first 09/18/2013    Past Surgical History:  Procedure Laterality Date  . WISDOM TOOTH  EXTRACTION      OB History    Gravida  3   Para  2   Term  2   Preterm  0   AB  1   Living  2     SAB  1   TAB  0   Ectopic  0   Multiple  0   Live Births  2            Home Medications    Prior to Admission medications   Medication Sig Start Date End Date Taking? Authorizing Provider  acetaminophen (TYLENOL) 325 MG tablet Take 2 tablets (650 mg total) by mouth every 4 (four) hours as needed (for pain scale < 4). 12/14/17   Paula Compton, MD  cyclobenzaprine (FLEXERIL) 10 MG tablet Take 1 tablet (10 mg total) by mouth 2 (two) times daily as needed for muscle spasms. 12/01/17   Lajean Manes, CNM  hydrocortisone (ANUSOL-HC) 25 MG suppository Place 1 suppository (25 mg total) rectally 2 (two) times daily. For 7 days 01/25/19   Daleen Bo, MD  ibuprofen (ADVIL,MOTRIN) 600 MG tablet Take 1 tablet (600 mg total) by mouth every 6 (six) hours. 06/08/18   Charlann Lange, PA-C  Prenatal Vit-Fe Fumarate-FA (PRENATAL MULTIVITAMIN) TABS tablet Take 1 tablet by mouth daily at 12 noon.    [provider]    Family History Family History  Problem Relation Age of Onset  . Asthma Sister   . Hearing loss Neg Hx     Social History Social History  Tobacco Use  . Smoking status: Former Research scientist (life sciences)  . Smokeless tobacco: Never Used  . Tobacco comment: quit 2011  Substance Use Topics  . Alcohol use: No  . Drug use: No     Allergies   Patient has no known allergies.   Review of Systems Review of Systems   Physical Exam Triage Vital Signs ED Triage Vitals  Enc Vitals Group     BP 05/31/19 1437 124/64     Pulse Rate 05/31/19 1437 83     Resp 05/31/19 1437 14     Temp 05/31/19 1437 98.5 F (36.9 C)     Temp Source 05/31/19 1437 Oral     SpO2 05/31/19 1437 100 %     Weight --      Height --      Head Circumference --      Peak Flow --      Pain Score 05/31/19 1435 0     Pain Loc --      Pain Edu? --      Excl. in [place]? --    No data  found.  Updated Vital Signs BP 124/64 (BP Location: Right Arm)   Pulse 83   Temp 98.5 F (36.9 C) (Oral)   Resp 14   LMP 04/28/2019 (Exact Date)   SpO2 100%   Visual Acuity Right Eye Distance:   Left Eye Distance:   Bilateral Distance:    Right Eye Near:   Left Eye Near:    Bilateral Near:     Physical Exam Vitals and nursing note reviewed.  Constitutional:      General: She is not in acute distress.    Appearance: Normal appearance. She is well-developed and normal weight. She is not ill-appearing.  HENT:     Head: Normocephalic and atraumatic.     Nose: Nose normal.     Mouth/Throat:     Mouth: Mucous membranes are moist.     Pharynx: Oropharynx is clear.  Eyes:     Extraocular Movements: Extraocular movements intact.     Conjunctiva/sclera: Conjunctivae normal.     Pupils: Pupils are equal, round, and reactive to light.  Cardiovascular:     Rate and Rhythm: Normal rate and regular rhythm.     Heart sounds: Normal heart sounds. No murmur.  Pulmonary:     Effort: Pulmonary effort is normal. No respiratory distress.     Breath sounds: Normal breath sounds. No stridor. No wheezing, rhonchi or rales.  Chest:     Chest wall: No tenderness.  Abdominal:     General: Bowel sounds are normal. There is no distension.     Palpations: Abdomen is soft. There is no mass.     Tenderness: There is no abdominal tenderness. There is no right CVA tenderness, left CVA tenderness, guarding or rebound.     Hernia: No hernia is present.  Musculoskeletal:        General: Normal range of motion.     Cervical back: Normal range of motion and neck supple.  Skin:    General: Skin is warm and dry.     Capillary Refill: Capillary refill takes less than 2 seconds.  Neurological:     General: No focal deficit present.     Mental Status: She is alert and oriented to person, place, and time.  Psychiatric:        Mood and Affect: Mood normal.        Behavior: Behavior normal.  UC  Treatments / Results  Labs (all labs ordered are listed, but only abnormal results are displayed) Labs Reviewed  HCG, QUANTITATIVE, PREGNANCY  POC URINE PREG, ED  POCT PREGNANCY, URINE    EKG   Radiology No results found.  Procedures Procedures (including critical care time)  Medications Ordered in UC Medications - No data to display  Initial Impression / Assessment and Plan / UC Course  I have reviewed the triage vital signs and the nursing notes.  Pertinent labs & imaging results that were available during my care of the patient were reviewed by me and considered in my medical decision making (see chart for details).  Clinical Course as of Jun 03 2318  Sat May 31, 2019  1448 POC urine pregnancy [SM]    Clinical Course User Index [SM] Faustino Congress, NP    Negative pregnancy test: Urine pregnancy in office is negative today.  Amenorrhea noted as she is missed her period this month.  Pelvic exam deferred due to possible pregnancy.  Will draw hCG today.  Inform patient that results will be available on her MyChart account.  Did not obtain vaginal swab in office today, as patient declined.  Instructed patient to follow-up with OB if hCG shows hormone levels that are congruent with pregnancy.  Discussed with patient that if she is having any abdominal pain, vaginal bleeding after having a positive pregnancy test or her serum hCG being elevated, that she is to follow-up with women's and children soon.  Patient verbalizes understanding and agrees with treatment plan. Final Clinical Impressions(s) / UC Diagnoses   Final diagnoses:  Negative pregnancy test  Amenorrhea  Possible pregnancy  Vaginal discharge     Discharge Instructions     Your pregnancy test in the office was negative today.  We have drawn blood to verify this, the results will show up on your mychart account.   Follow up with gynecology as needed.     ED Prescriptions    None     PDMP not  reviewed this encounter.   Faustino Congress, NP 06/03/19 2320

## 2019-05-31 NOTE — Discharge Instructions (Signed)
Your pregnancy test in the office was negative today.  We have drawn blood to verify this, the results will show up on your mychart account.   Follow up with gynecology as needed.

## 2019-05-31 NOTE — ED Triage Notes (Signed)
Reports that she is five days late for her period. Pt also endorses vaginal discharge during the week, but has sense gone away.

## 2019-06-28 IMAGING — US US OB TRANSVAGINAL
1 series · 15 of 28 positions shown · non-contrast
Comparison: None.

CLINICAL DATA: Follow-up ultrasound to assess viability

EXAM:
TRANSVAGINAL OB ULTRASOUND
TECHNIQUE: Transvaginal ultrasound was performed for complete evaluation of the
gestation as well as the maternal uterus, adnexal regions, and
pelvic cul-de-sac.

[Series 1: us ob transvaginal · 42 acquisitions, 15 frames shown]
[im 1/42]
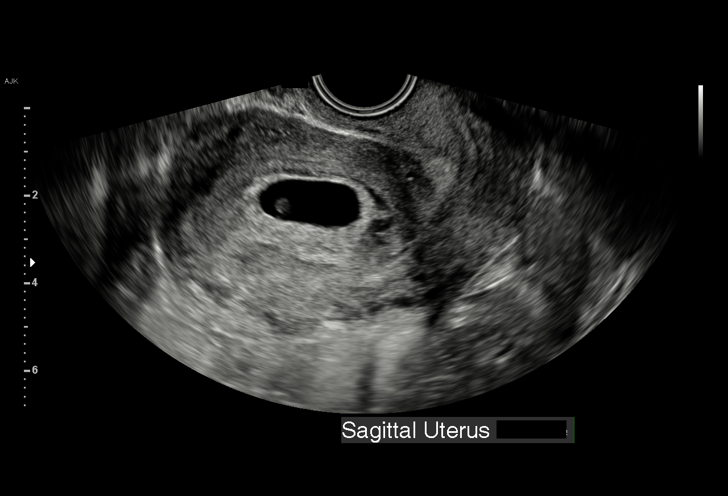
[im 4/42]
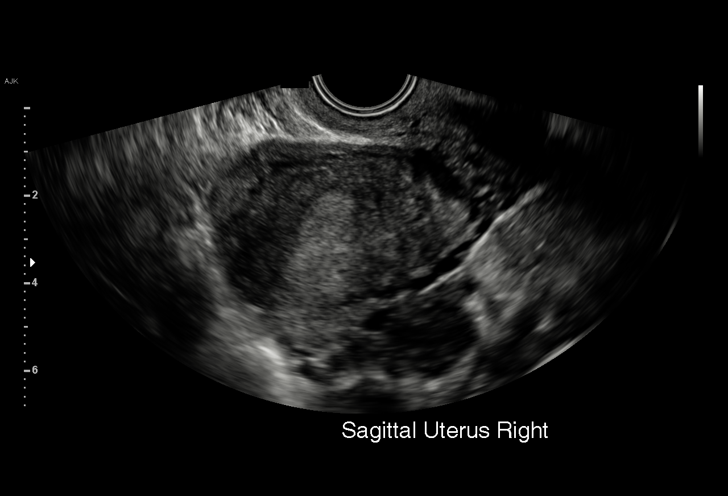
[im 7/42]
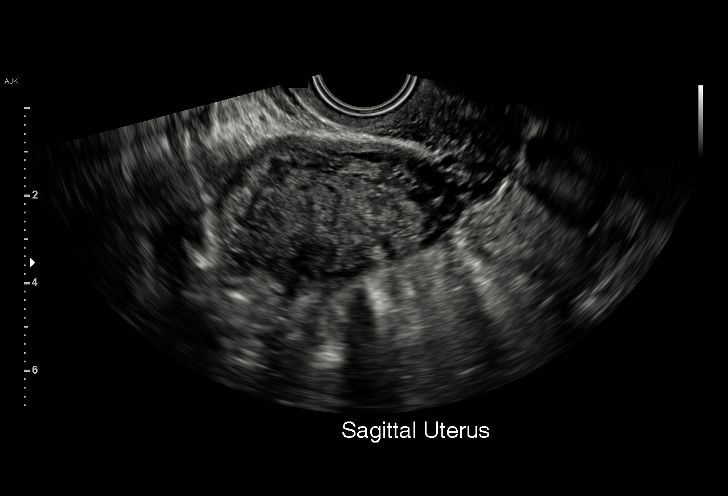
[im 10/42]
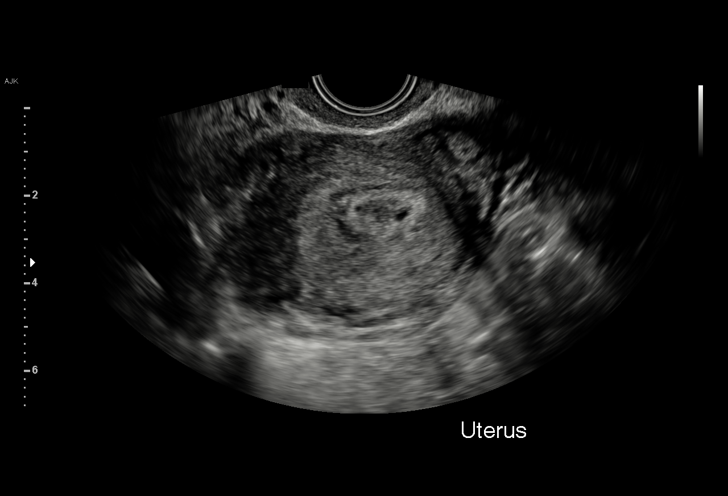
[im 13/42]
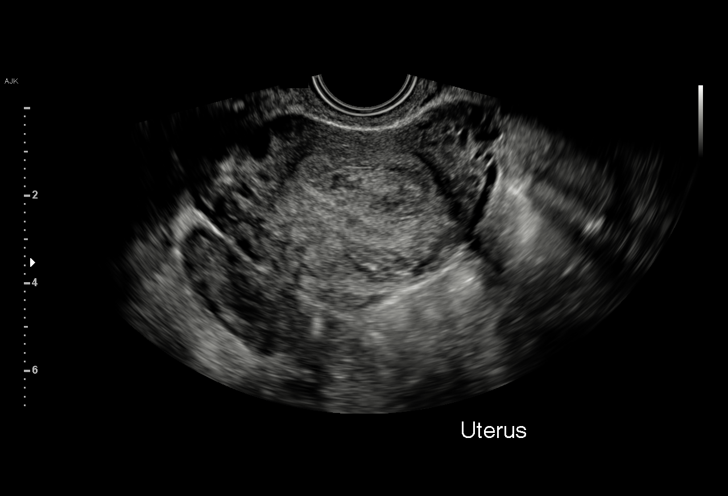
[im 16/42]
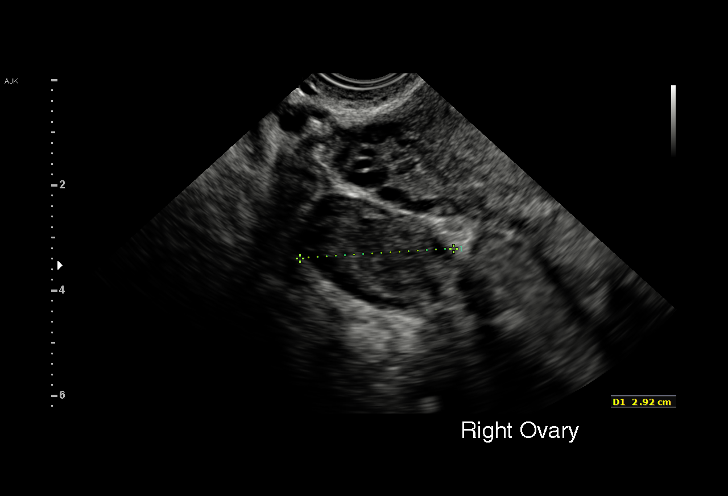
[im 19/42]
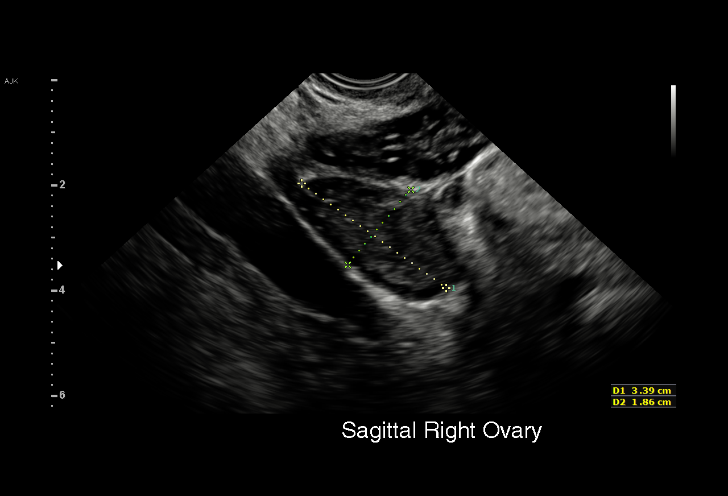
[im 22/42]
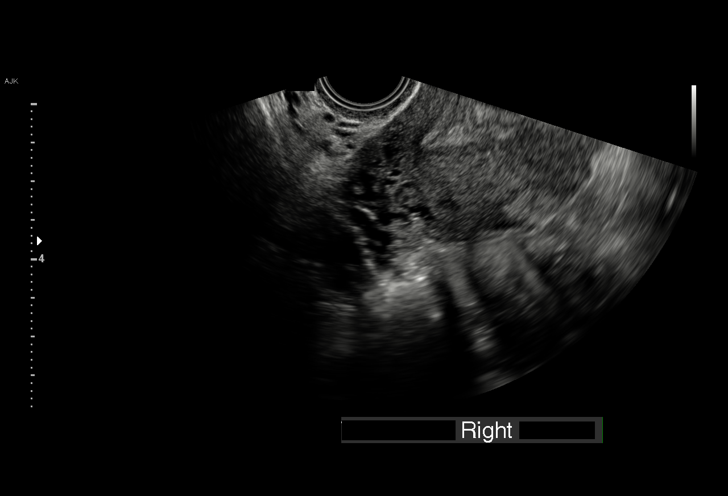
[im 23/42]
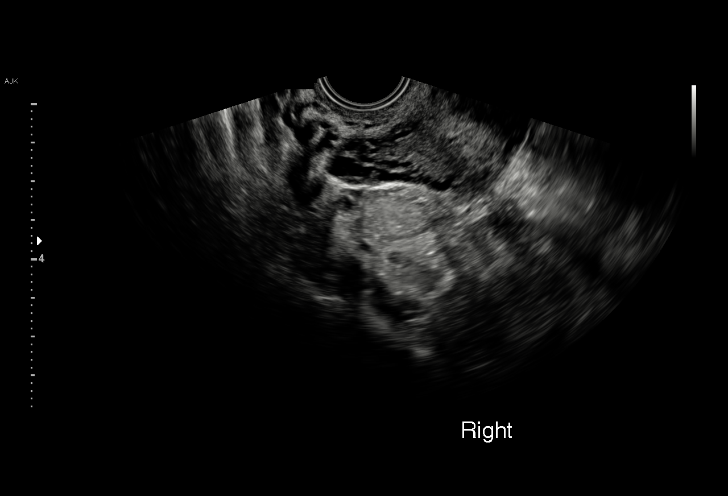
[im 26/42]
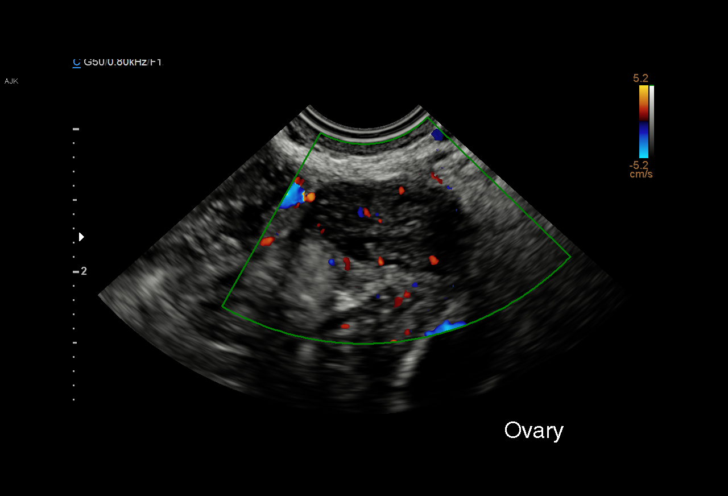
[im 29/42]
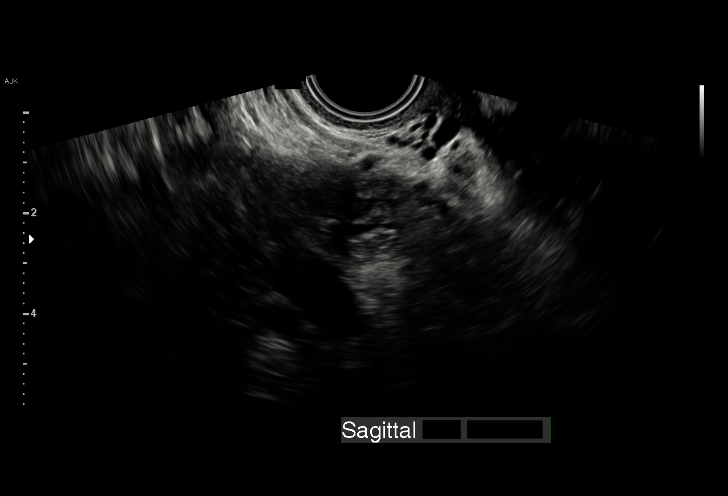
[im 32/42]
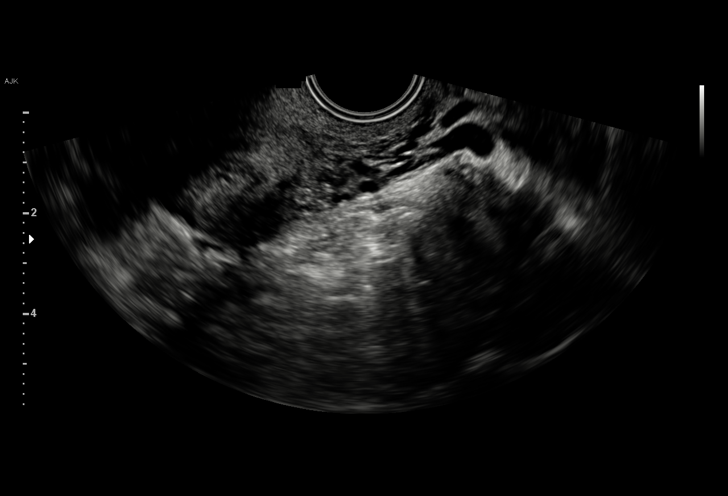
[im 35/42]
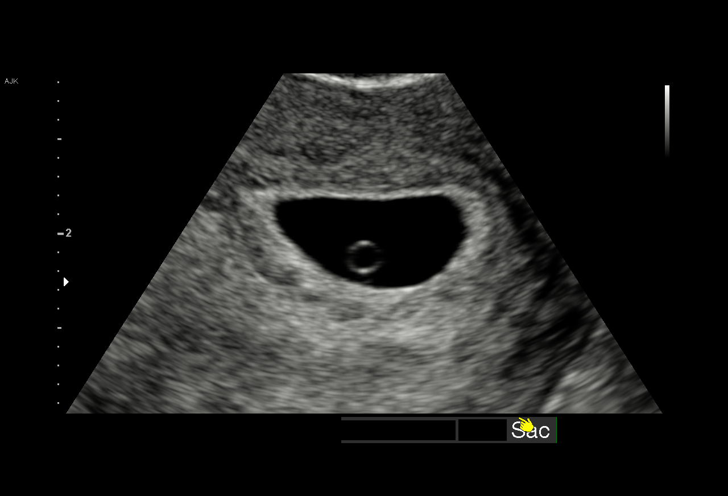
[im 38/42]
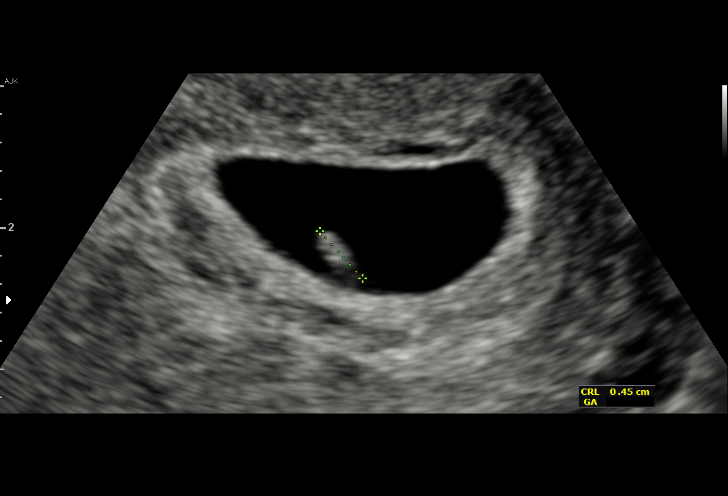
[im 42/42]
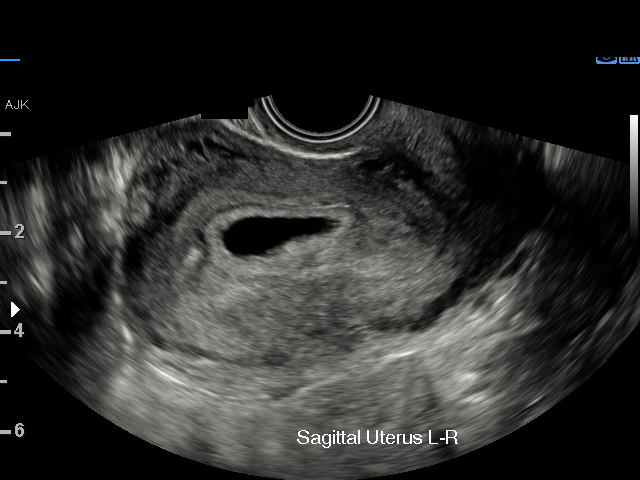

[15 of 28 positions shown; findings below may reference images not displayed]

The patient's prior ultrasound from April 08, 2017 is not available for comparison.
FINDINGS: Intrauterine gestational sac: Single

Yolk sac:  Present

Embryo:  Present

Cardiac Activity: Present

Heart Rate: 112 bpm

MSD:   mm    w     d

CRL: 0.46 cm 6 w 1 d                  US EDC: December 16, 2017

Subchorionic hemorrhage:  None visualized.

Maternal uterus/adnexae: The ovaries are normal. Trace free fluid is
identified in the pelvis.
IMPRESSION: Single live intrauterine pregnancy measuring to 6 weeks and 1 day
without complication.

## 2019-07-24 ENCOUNTER — Other Ambulatory Visit: Payer: Self-pay

## 2019-07-24 ENCOUNTER — Emergency Department (HOSPITAL_COMMUNITY)
Admission: EM | Admit: 2019-07-24 | Discharge: 2019-07-24 | Disposition: A | Discharge status: Home / Self Care | Payer: Medicaid Other | Attending: Emergency Medicine | Admitting: Emergency Medicine

## 2019-07-24 ENCOUNTER — Ambulatory Visit (HOSPITAL_COMMUNITY)
Admission: EM | Admit: 2019-07-24 | Discharge: 2019-07-24 | Disposition: A | Discharge status: Home / Self Care | Payer: Medicaid Other | Attending: Urgent Care | Admitting: Urgent Care

## 2019-07-24 ENCOUNTER — Encounter (HOSPITAL_COMMUNITY): Payer: Self-pay | Admitting: Emergency Medicine

## 2019-07-24 DIAGNOSIS — N760 Acute vaginitis: Secondary | ICD-10-CM | POA: Insufficient documentation

## 2019-07-24 DIAGNOSIS — Z5321 Procedure and treatment not carried out due to patient leaving prior to being seen by health care provider: Secondary | ICD-10-CM | POA: Diagnosis not present

## 2019-07-24 DIAGNOSIS — M7918 Myalgia, other site: Secondary | ICD-10-CM | POA: Diagnosis not present

## 2019-07-24 DIAGNOSIS — B9689 Other specified bacterial agents as the cause of diseases classified elsewhere: Secondary | ICD-10-CM | POA: Insufficient documentation

## 2019-07-24 DIAGNOSIS — N912 Amenorrhea, unspecified: Secondary | ICD-10-CM | POA: Insufficient documentation

## 2019-07-24 DIAGNOSIS — Z3202 Encounter for pregnancy test, result negative: Secondary | ICD-10-CM

## 2019-07-24 DIAGNOSIS — R110 Nausea: Secondary | ICD-10-CM | POA: Diagnosis not present

## 2019-07-24 LAB — POC URINE PREG, ED: Preg Test, Ur: NEGATIVE

## 2019-07-24 NOTE — ED Triage Notes (Signed)
Pt reports body aches x1 week, denies cough, fever, chills, sore throat or recent sick contacts. Has not taken anything for symptoms at home.

## 2019-07-24 NOTE — ED Provider Notes (Signed)
Radiology technologist Vanetta Shawl requested that I retrospectively place an order for urine pregnancy test and her cervical swab.   Jaynee Eagles, PA-C 07/24/19 1600

## 2019-07-25 ENCOUNTER — Telehealth (HOSPITAL_COMMUNITY): Payer: Self-pay | Admitting: Orthopedic Surgery

## 2019-07-25 LAB — CERVICOVAGINAL ANCILLARY ONLY
Bacterial Vaginitis (gardnerella): POSITIVE — AB
Candida Glabrata: NEGATIVE
Candida Vaginitis: NEGATIVE
Chlamydia: NEGATIVE
Comment: NEGATIVE
Comment: NEGATIVE
Comment: NEGATIVE
Comment: NEGATIVE
Comment: NEGATIVE
Comment: NORMAL
Neisseria Gonorrhea: NEGATIVE
Trichomonas: NEGATIVE

## 2019-07-25 MED ORDER — METRONIDAZOLE 500 MG PO TABS: 500.0000 mg | ORAL_TABLET | Freq: Two times a day (BID) | ORAL | 0 refills | Status: DC

## 2019-07-28 ENCOUNTER — Encounter (HOSPITAL_COMMUNITY): Payer: Self-pay | Admitting: Emergency Medicine

## 2019-07-28 ENCOUNTER — Emergency Department (HOSPITAL_COMMUNITY)
Admission: EM | Admit: 2019-07-28 | Discharge: 2019-07-28 | Disposition: A | Discharge status: Home / Self Care | Payer: Medicaid Other | Attending: Emergency Medicine | Admitting: Emergency Medicine

## 2019-07-28 ENCOUNTER — Other Ambulatory Visit: Payer: Self-pay

## 2019-07-28 DIAGNOSIS — R102 Pelvic and perineal pain: Secondary | ICD-10-CM | POA: Diagnosis not present

## 2019-07-28 DIAGNOSIS — D509 Iron deficiency anemia, unspecified: Secondary | ICD-10-CM | POA: Diagnosis not present

## 2019-07-28 DIAGNOSIS — Z9114 Patient's other noncompliance with medication regimen: Secondary | ICD-10-CM | POA: Insufficient documentation

## 2019-07-28 DIAGNOSIS — Z87891 Personal history of nicotine dependence: Secondary | ICD-10-CM | POA: Insufficient documentation

## 2019-07-28 DIAGNOSIS — Z3200 Encounter for pregnancy test, result unknown: Secondary | ICD-10-CM | POA: Diagnosis present

## 2019-07-28 LAB — COMPREHENSIVE METABOLIC PANEL
ALT: 12 U/L (ref 0–44)
AST: 15 U/L (ref 15–41)
Albumin: 4.2 g/dL (ref 3.5–5.0)
Alkaline Phosphatase: 35 U/L — ABNORMAL LOW (ref 38–126)
Anion gap: 9 (ref 5–15)
BUN: 11 mg/dL (ref 6–20)
CO2: 23 mmol/L (ref 22–32)
Calcium: 9.4 mg/dL (ref 8.9–10.3)
Chloride: 106 mmol/L (ref 98–111)
Creatinine, Ser: 0.62 mg/dL (ref 0.44–1.00)
GFR calc Af Amer: >60 mL/min (ref >60)
GFR calc non Af Amer: >60 mL/min (ref >60)
Glucose, Bld: 90 mg/dL (ref 70–99)
Potassium: 3.4 mmol/L — ABNORMAL LOW (ref 3.5–5.1)
Sodium: 138 mmol/L (ref 135–145)
Total Bilirubin: 0.6 mg/dL (ref 0.3–1.2)
Total Protein: 7.6 g/dL (ref 6.5–8.1)

## 2019-07-28 LAB — CBC
HCT: 23.5 % — ABNORMAL LOW (ref 36.0–46.0)
Hemoglobin: 6.2 g/dL — CL (ref 12.0–15.0)
MCH: 17.5 pg — ABNORMAL LOW (ref 26.0–34.0)
MCHC: 26.4 g/dL — ABNORMAL LOW (ref 30.0–36.0)
MCV: 66.2 fL — ABNORMAL LOW (ref 80.0–100.0)
Platelets: 194 10*3/uL (ref 150–400)
RBC: 3.55 MIL/uL — ABNORMAL LOW (ref 3.87–5.11)
RDW: 21.9 % — ABNORMAL HIGH (ref 11.5–15.5)
WBC: 5 10*3/uL (ref 4.0–10.5)
nRBC: 0 % (ref 0.0–0.2)

## 2019-07-28 LAB — URINALYSIS, ROUTINE W REFLEX MICROSCOPIC
Bilirubin Urine: NEGATIVE
Glucose, UA: NEGATIVE mg/dL
Hgb urine dipstick: NEGATIVE
Ketones, ur: 5 mg/dL — AB
Leukocytes,Ua: NEGATIVE
Nitrite: NEGATIVE
Protein, ur: NEGATIVE mg/dL
Specific Gravity, Urine: 1.026 (ref 1.005–1.030)
pH: 5 (ref 5.0–8.0)

## 2019-07-28 LAB — LIPASE, BLOOD: Lipase: 25 U/L (ref 11–51)

## 2019-07-28 LAB — I-STAT BETA HCG BLOOD, ED (MC, WL, AP ONLY): I-stat hCG, quantitative: <5 m[IU]/mL (ref <5)

## 2019-07-28 MED ORDER — FERROUS SULFATE 325 (65 FE) MG PO TABS
325.0000 mg | ORAL_TABLET | Freq: Two times a day (BID) | ORAL | 1 refills | Status: DC
Start: 2019-07-28 — End: 2020-05-27

## 2019-07-28 MED ORDER — SODIUM CHLORIDE 0.9% FLUSH: 3.0000 mL | Freq: Once | INTRAVENOUS | Status: DC

## 2019-07-28 NOTE — ED Notes (Signed)
No answer x2 

## 2019-07-28 NOTE — ED Notes (Signed)
No answer from lobby  

## 2019-07-28 NOTE — ED Notes (Signed)
Called mom at [phone] and she is telling pateint to come back and get blood.

## 2019-07-28 NOTE — ED Notes (Signed)
Pt provided labeled specimen cup for urine collection. ENMiles 

## 2019-07-28 NOTE — ED Provider Notes (Signed)
[facility] DEPT Provider Note   CSN: [id number] Arrival date & time: 07/28/19  1520     History Chief Complaint  Patient presents with  . Abdominal Pain  . Possible Pregnancy    Kimberly Gibson is a 24 y.o. female.  Patient is a 24 year old female with past medical history of prior pregnancy and microcytic anemia.  She presents today for evaluation of possible pregnancy.  She had a positive pregnancy test at home 2 days ago, then this morning started having some pelvic cramping.  She presents for evaluation of this.  She denies any bleeding, fevers, or chills.  She denies any bowel or urinary complaints.  She denies any black or bloody stools.  The history is provided by the patient.  Abdominal Pain Pain location:  Suprapubic Pain quality: cramping   Pain radiates to:  Does not radiate Pain severity:  Mild Onset quality:  Sudden Duration:  12 hours Timing:  Constant Progression:  Improving Relieved by:  Nothing Worsened by:  Nothing Ineffective treatments:  None tried Associated symptoms: no fatigue, no fever, no hematochezia, no vaginal bleeding and no vaginal discharge        Past Medical History:  Diagnosis Date  . BV (bacterial vaginosis)   . GERD (gastroesophageal reflux disease)   . Normal pregnancy in multigravida, antepartum 12/11/2017  . SVD (spontaneous vaginal delivery) 12/12/2017    Patient Active Problem List   Diagnosis Date Noted  . Normal pregnancy in multigravida in third trimester 12/12/2017  . SVD (spontaneous vaginal delivery) 12/12/2017  . Normal pregnancy in multigravida, antepartum 12/11/2017  . No leakage of amniotic fluid into vagina 11/27/2017  . Adjustment disorder with mixed anxiety and depressed mood 08/28/2015  . GERD (gastroesophageal reflux disease) 08/27/2015  . Overdose of iron or iron compound 08/27/2015  . Microcytic anemia 08/27/2015  . Normal pregnancy, first 09/18/2013    Past Surgical  History:  Procedure Laterality Date  . WISDOM TOOTH EXTRACTION       OB History    Gravida  4   Para  2   Term  2   Preterm  0   AB  1   Living  2     SAB  1   TAB  0   Ectopic  0   Multiple  0   Live Births  2           Family History  Problem Relation Age of Onset  . Asthma Sister   . Hearing loss Neg Hx     Social History   Tobacco Use  . Smoking status: Former Research scientist (life sciences)  . Smokeless tobacco: Never Used  . Tobacco comment: quit 2011  Substance Use Topics  . Alcohol use: No  . Drug use: No    Home Medications Prior to Admission medications   Medication Sig Start Date End Date Taking? Authorizing Provider  acetaminophen (TYLENOL) 325 MG tablet Take 2 tablets (650 mg total) by mouth every 4 (four) hours as needed (for pain scale < 4). 12/14/17  Yes Paula Compton, MD  metroNIDAZOLE (FLAGYL) 500 MG tablet Take 1 tablet (500 mg total) by mouth 2 (two) times daily. Patient taking differently: Take 500 mg by mouth daily. Start date: 07/25/19 07/25/19  Yes Lamptey, Myrene Galas, MD  cyclobenzaprine (FLEXERIL) 10 MG tablet Take 1 tablet (10 mg total) by mouth 2 (two) times daily as needed for muscle spasms. Patient not taking: Reported on 07/28/2019 12/01/17   Lajean Manes,  CNM  hydrocortisone (ANUSOL-HC) 25 MG suppository Place 1 suppository (25 mg total) rectally 2 (two) times daily. For 7 days Patient not taking: Reported on 07/28/2019 01/25/19   Daleen Bo, MD  ibuprofen (ADVIL,MOTRIN) 600 MG tablet Take 1 tablet (600 mg total) by mouth every 6 (six) hours. Patient not taking: Reported on 07/28/2019 06/08/18   Charlann Lange, PA-C    Allergies    Patient has no known allergies.  Review of Systems   Review of Systems  Constitutional: Negative for fatigue and fever.  Gastrointestinal: Negative for hematochezia.  Genitourinary: Negative for vaginal bleeding and vaginal discharge.  All other systems reviewed and are negative.   Physical  Exam Updated Vital Signs BP 111/67 (BP Location: Right Arm)   Pulse 84   Temp 98.5 F (36.9 C) (Oral)   Resp 18   LMP 06/23/2019   SpO2 100%   Physical Exam Vitals and nursing note reviewed.  Constitutional:      General: She is not in acute distress.    Appearance: She is well-developed. She is not diaphoretic.  HENT:     Head: Normocephalic and atraumatic.  Cardiovascular:     Rate and Rhythm: Normal rate and regular rhythm.     Heart sounds: No murmur. No friction rub. No gallop.   Pulmonary:     Effort: Pulmonary effort is normal. No respiratory distress.     Breath sounds: Normal breath sounds. No wheezing.  Abdominal:     General: Bowel sounds are normal. There is no distension.     Palpations: Abdomen is soft.     Tenderness: There is no abdominal tenderness.  Musculoskeletal:        General: Normal range of motion.     Cervical back: Normal range of motion and neck supple.  Skin:    General: Skin is warm and dry.  Neurological:     Mental Status: She is alert and oriented to person, place, and time.     ED Results / Procedures / Treatments   Labs (all labs ordered are listed, but only abnormal results are displayed) Labs Reviewed  COMPREHENSIVE METABOLIC PANEL - Abnormal; Notable for the following components:      Result Value   Potassium 3.4 (*)    Alkaline Phosphatase 35 (*)    All other components within normal limits  CBC - Abnormal; Notable for the following components:   RBC 3.55 (*)    Hemoglobin 6.2 (*)    HCT 23.5 (*)    MCV 66.2 (*)    MCH 17.5 (*)    MCHC 26.4 (*)    RDW 21.9 (*)    All other components within normal limits  URINALYSIS, ROUTINE W REFLEX MICROSCOPIC - Abnormal; Notable for the following components:   Color, Urine AMBER (*)    APPearance HAZY (*)    Ketones, ur 5 (*)    All other components within normal limits  LIPASE, BLOOD  I-STAT BETA HCG BLOOD, ED ([facility], [facility], [facility] ONLY)    EKG None  Radiology No results  found.  Procedures Procedures (including critical care time)  Medications Ordered in ED Medications  sodium chloride flush (NS) 0.9 % injection 3 mL (has no administration in time range)    ED Course  I have reviewed the triage vital signs and the nursing notes.  Pertinent labs & imaging results that were available during my care of the patient were reviewed by me and considered in my medical decision making (see chart  for details).    MDM Rules/Calculators/A&P  Patient is a 24 year old female initially presenting with complaints of possible pregnancy.  She took a home test 2 days ago which read positive.  She describes some suprapubic cramping this morning and presents for confirmation of pregnancy.  Patient had laboratory studies obtained, the left prior to being seen.  Her laboratory work returned with a hemoglobin of 6.2 and indices consistent with a microcytic anemia.  Patient has had this in the past with most recent hemoglobin of 7.  She is supposed to be taking iron supplements, however has not been compliant with this.  She is not symptomatic and is having no dizziness, shortness of breath, or orthostasis.  Her pregnancy test today is negative and her abdomen is benign.  She denies any vaginal bleeding or discharge and declines further work-up into this.  Her main motivation for coming in was to establish confirmation of her home pregnancy test.  At this point, I feel as though patient is appropriate for discharge.  She will be prescribed iron supplementation and is to follow-up with her GYN/primary doctor in the next few days to have her blood counts repeated.  Final Clinical Impression(s) / ED Diagnoses Final diagnoses:  None    Rx / DC Orders ED Discharge Orders    None       Veryl Speak, MD 07/28/19 2021

## 2019-07-28 NOTE — ED Notes (Signed)
Pt called from lobby x3 No answer This writer attempted calling pt at [phone] and no answer Informed RN of same

## 2019-07-28 NOTE — ED Notes (Signed)
RN attempted to call patient on designated number and left a HIPAA compliant voice message for her to please call back

## 2019-07-28 NOTE — Discharge Instructions (Signed)
Begin taking iron supplements as prescribed.  Follow-up with your primary doctor in the next week for a repeat blood count, and return to the ER if you develop shortness of breath, dizziness, chest pain, or other new and concerning symptoms.

## 2019-07-28 NOTE — ED Triage Notes (Signed)
Patient here from home reporting abd pain after having positive pregnancy test 2 days ago. Reports lower pelvic pain and cramping.

## 2019-08-02 ENCOUNTER — Other Ambulatory Visit: Payer: Self-pay

## 2019-08-02 ENCOUNTER — Ambulatory Visit (HOSPITAL_COMMUNITY)
Admission: EM | Admit: 2019-08-02 | Discharge: 2019-08-02 | Disposition: A | Discharge status: Home / Self Care | Payer: Medicaid Other | Attending: Family Medicine | Admitting: Family Medicine

## 2019-08-02 ENCOUNTER — Ambulatory Visit (INDEPENDENT_AMBULATORY_CARE_PROVIDER_SITE_OTHER): Payer: Medicaid Other

## 2019-08-02 ENCOUNTER — Encounter (HOSPITAL_COMMUNITY): Payer: Self-pay

## 2019-08-02 DIAGNOSIS — M79641 Pain in right hand: Secondary | ICD-10-CM

## 2019-08-02 DIAGNOSIS — S62316A Displaced fracture of base of fifth metacarpal bone, right hand, initial encounter for closed fracture: Secondary | ICD-10-CM

## 2019-08-02 MED ORDER — IBUPROFEN 800 MG PO TABS
800.0000 mg | ORAL_TABLET | Freq: Three times a day (TID) | ORAL | 0 refills | Status: DC
Start: 2019-08-02 — End: 2020-05-27

## 2019-08-02 NOTE — Progress Notes (Signed)
Orthopedic Tech Progress Note Patient Details:  Kimberly Gibson 05/03/1995 [id number]  Ortho Devices Type of Ortho Device: Ulna gutter splint, Sling immobilizer Ortho Device/Splint Location: Right upper extremity Ortho Device/Splint Interventions: Ordered, Application   Post Interventions Patient Tolerated: Well Instructions Provided: Adjustment of device, Care of device, Poper ambulation with device   Pinky Ravan Allie Dimmer 08/02/2019, 2:02 PM

## 2019-08-02 NOTE — ED Provider Notes (Signed)
[facility]   [id number] 08/02/19 Arrival Time: 8756  ASSESSMENT & PLAN:  1. Right hand pain   2. Displaced fracture of base of fifth metacarpal bone, right hand, initial encounter for closed fracture     I have personally viewed the imaging studies ordered this visit. Proximal fifth metacarpal fracture.   Meds ordered this encounter  Medications   ibuprofen (ADVIL) 800 MG tablet    Sig: Take 1 tablet (800 mg total) by mouth 3 (three) times daily with meals.    Dispense:  21 tablet    Refill:  0    Orders Placed This Encounter  Procedures   DG Hand Complete Right   Apply splint short arm   Apply Shoulder Immobilizer/Sling    Recommend:   Follow-up Information    Leanora Cover, MD.   Specialty: Orthopedic Surgery Contact information: [place] [address] [phone]           Work note provided.  Reviewed expectations re: course of current medical issues. Questions answered. Outlined signs and symptoms indicating need for more acute intervention. Patient verbalized understanding. After Visit Summary given.  SUBJECTIVE: History from: patient. Kimberly Gibson is a 24 y.o. female who reports fairly persistent moderate pain of her right hand; described as aching; without radiation. Onset: abrupt. First noted: 2 d ago. Injury/trama: reports boxing/sparring; punched and missed and hand went through sheet rock. Symptoms have progressed to a point and plateaued since beginning. Aggravating factors: certain movements and grasping. Alleviating factors: rest. Associated symptoms: none reported. Extremity sensation changes or weakness: none. Self treatment: OTC analgesics without much relief.  History of similar: no.  Past Surgical History:  Procedure Laterality Date   WISDOM TOOTH EXTRACTION        OBJECTIVE:  Vitals:   08/02/19 1211  BP: 110/60  Pulse: 89  Resp: 16  Temp: 98.8 F (37.1 C)  TempSrc: Oral  SpO2: 100%    Weight: 59 kg  Height: 5\' 4"  (1.626 m)    General appearance: alert; no distress HEENT: [place]; AT Neck: supple with FROM Resp: unlabored respirations Extremities:  RUE: warm with well perf used appearance; fairly well localized moderate tenderness over right proximal 4/5 metacarpals; without gross deformities; swelling: moderate; bruising: none  CV: brisk extremity capillary refill of RUE; 2+ radial pulse of RUE. Skin: warm and dry; no visible rashes Neurologic: gait normal; normal sensation and strength of RUE Psychological: alert and cooperative; normal mood and affect  Imaging: DG Hand Complete Right  Result Date: 08/02/2019 CLINICAL DATA:  The patient punched a wall with the right fist. Pain in the right hand. EXAM: RIGHT HAND - COMPLETE 3+ VIEW COMPARISON:  12/04/2015 FINDINGS: Acute fracture of the base of the fifth metacarpal, with involvement of the metaphysis and probable involvement of the proximal articular surface based on the appearance. No other fractures are identified. IMPRESSION: Acute fracture of the base of the fifth metacarpal. Electronically Signed   By: Van Clines M.D.   On: 08/02/2019 13:38      No Known Allergies  Past Medical History:  Diagnosis Date   BV (bacterial vaginosis)    GERD (gastroesophageal reflux disease)    Normal pregnancy in multigravida, antepartum 12/11/2017   SVD (spontaneous vaginal delivery) 12/12/2017   Social History   Socioeconomic History   Marital status: Single    Spouse name: Not on file   Number of children: Not on file   Years of education: Not on file  Highest education level: Not on file  Occupational History   Not on file  Tobacco Use   Smoking status: Current Some Day Smoker    Types: Cigarettes   Smokeless tobacco: Never Used  Substance and Sexual Activity   Alcohol use: No   Drug use: No   Sexual activity: Yes    Birth control/protection: None  Other Topics Concern   Not on file   Social History Narrative   Not on file   Social Determinants of Health   Financial Resource Strain:    Difficulty of Paying Living Expenses:   Food Insecurity:    Worried About Charity fundraiser in the Last Year:    Arboriculturist in the Last Year:   Transportation Needs:    Film/video editor (Medical):    Lack of Transportation (Non-Medical):   Physical Activity:    Days of Exercise per Week:    Minutes of Exercise per Session:   Stress:    Feeling of Stress :   Social Connections:    Frequency of Communication with Friends and Family:    Frequency of Social Gatherings with Friends and Family:    Attends Religious Services:    Active Member of Clubs or Organizations:    Attends Archivist Meetings:    Marital Status:    Family History  Problem Relation Age of Onset   Asthma Sister    Hearing loss Neg Hx    Past Surgical History:  Procedure Laterality Date   WISDOM TOOTH EXTRACTION        Vanessa Kick, MD 08/02/19 1422

## 2019-08-02 NOTE — ED Notes (Signed)
Ortho tech has been notified

## 2019-08-02 NOTE — ED Triage Notes (Signed)
Pt was boxing and she injured her right hand 2 days ago. Pt has 2+ edema of right knuckle area on right hand. Pt is barely able to make a fist with right hand. Pt c/o 2/10 sharp pain in right hand. Pt states it hurts when when she tries to pick things up.

## 2019-08-06 ENCOUNTER — Other Ambulatory Visit: Payer: Self-pay | Admitting: Orthopedic Surgery

## 2019-08-07 ENCOUNTER — Encounter (HOSPITAL_COMMUNITY): Payer: Self-pay | Admitting: Physician Assistant

## 2019-08-07 ENCOUNTER — Encounter (HOSPITAL_BASED_OUTPATIENT_CLINIC_OR_DEPARTMENT_OTHER): Payer: Self-pay | Admitting: Orthopedic Surgery

## 2019-08-07 ENCOUNTER — Other Ambulatory Visit (HOSPITAL_COMMUNITY)
Admission: RE | Admit: 2019-08-07 | Discharge: 2019-08-07 | Disposition: A | Discharge status: Home / Self Care | Payer: Medicaid Other | Source: Ambulatory Visit | Attending: Orthopedic Surgery | Admitting: Orthopedic Surgery

## 2019-08-07 ENCOUNTER — Other Ambulatory Visit: Payer: Self-pay

## 2019-08-07 DIAGNOSIS — K219 Gastro-esophageal reflux disease without esophagitis: Secondary | ICD-10-CM | POA: Diagnosis not present

## 2019-08-07 DIAGNOSIS — Z20822 Contact with and (suspected) exposure to covid-19: Secondary | ICD-10-CM | POA: Diagnosis not present

## 2019-08-07 DIAGNOSIS — F17210 Nicotine dependence, cigarettes, uncomplicated: Secondary | ICD-10-CM | POA: Diagnosis not present

## 2019-08-07 DIAGNOSIS — D649 Anemia, unspecified: Secondary | ICD-10-CM | POA: Insufficient documentation

## 2019-08-07 DIAGNOSIS — Y9371 Activity, boxing: Secondary | ICD-10-CM | POA: Diagnosis not present

## 2019-08-07 DIAGNOSIS — S62316A Displaced fracture of base of fifth metacarpal bone, right hand, initial encounter for closed fracture: Secondary | ICD-10-CM | POA: Diagnosis not present

## 2019-08-07 DIAGNOSIS — W228XXA Striking against or struck by other objects, initial encounter: Secondary | ICD-10-CM | POA: Diagnosis not present

## 2019-08-07 DIAGNOSIS — Z01812 Encounter for preprocedural laboratory examination: Secondary | ICD-10-CM | POA: Insufficient documentation

## 2019-08-07 DIAGNOSIS — Z79899 Other long term (current) drug therapy: Secondary | ICD-10-CM | POA: Diagnosis not present

## 2019-08-07 DIAGNOSIS — D509 Iron deficiency anemia, unspecified: Secondary | ICD-10-CM | POA: Diagnosis not present

## 2019-08-07 LAB — CBC
HCT: 21.6 % — ABNORMAL LOW (ref 36.0–46.0)
Hemoglobin: 5.6 g/dL — CL (ref 12.0–15.0)
MCH: 17.3 pg — ABNORMAL LOW (ref 26.0–34.0)
MCHC: 25.9 g/dL — ABNORMAL LOW (ref 30.0–36.0)
MCV: 66.7 fL — ABNORMAL LOW (ref 80.0–100.0)
Platelets: 842 10*3/uL — ABNORMAL HIGH (ref 150–400)
RBC: 3.24 MIL/uL — ABNORMAL LOW (ref 3.87–5.11)
RDW: 22.7 % — ABNORMAL HIGH (ref 11.5–15.5)
WBC: 5.4 10*3/uL (ref 4.0–10.5)
nRBC: 0 % (ref 0.0–0.2)

## 2019-08-07 LAB — SARS CORONAVIRUS 2 (TAT 6-24 HRS): SARS Coronavirus 2: NEGATIVE

## 2019-08-07 NOTE — Progress Notes (Addendum)
Kimberly Gibson denies chest pain or shortness of breath. Patient tested negative for Covid_6/10/21 and is in quarantine. Patient was moved from [facility] to [facility] due to hgb-6.2/hct 23.5 drawn 07/28/2019.  Kimberly Gibson was sent to [facility] for a repeat CBC. Patient did not exibit any shortness of breath, she denies dizziness or shortness of breath. Repeat hgb/hct was hgb 5.6, hct 21.6. [name] PA-C. Reviewed results and spoke to Dr Fredna Dow and anesthesiologist. Plan is to continue with surgery. I told Kimberly Gibson, she said , "if I need blood they can give it to me." Kimberly. Gibson has not picked up Fe that was prescribed for her when she was in the ED. Kimberly Gibson does not have a PCP, she is in the process of finding one.

## 2019-08-07 NOTE — Progress Notes (Signed)
Chart reviewed with Dr. Daiva Huge and due to low hemoglobins, patient is not a candidate for outpatient surgery at [facility]. Elmo Putt at Dr. Levell July office notified.

## 2019-08-07 NOTE — Progress Notes (Addendum)
Pt with chronic anemia, hgb recently trending down more. Scheduled for closed reduction and pinning right metacarpal fracture with Dr. Fredna Dow (initially at [facility] but per Dr. Daiva Huge better served at [facility] due to hx of severe anemia). She was advised by Dr. Levell July office to present to preadmission testing today for recheck of hgb. CBC ordered, showed Hgb 5.6 (down from 6.2 on 07/18/18) and platelets 842k (up from 194k 07/28/18). These results were called to Dr. Levell July office.   Cas discussed with Dr. Smith Robert. Okay to proceed as planned pending DOS eval. Dr. Fredna Dow to discuss with patient regarding outpatient followup for anemia.   Wynonia Musty [facility] Short Stay Center/Anesthesiology Phone [phone] 08/07/2019 2:05 PM

## 2019-08-08 ENCOUNTER — Ambulatory Visit (HOSPITAL_COMMUNITY): Payer: Medicaid Other | Admitting: Physician Assistant

## 2019-08-08 ENCOUNTER — Encounter (HOSPITAL_COMMUNITY): Payer: Self-pay | Admitting: Orthopedic Surgery

## 2019-08-08 ENCOUNTER — Encounter (HOSPITAL_COMMUNITY)
Admission: RE | Disposition: A | Discharge status: Home / Self Care | Payer: Self-pay | Source: Home / Self Care | Attending: Orthopedic Surgery

## 2019-08-08 ENCOUNTER — Ambulatory Visit (HOSPITAL_COMMUNITY)
Admission: RE | Admit: 2019-08-08 | Discharge: 2019-08-08 | Disposition: A | Discharge status: Home / Self Care | Payer: Medicaid Other | Attending: Orthopedic Surgery | Admitting: Orthopedic Surgery

## 2019-08-08 ENCOUNTER — Other Ambulatory Visit: Payer: Self-pay

## 2019-08-08 ENCOUNTER — Ambulatory Visit (HOSPITAL_COMMUNITY): Payer: Medicaid Other

## 2019-08-08 DIAGNOSIS — D509 Iron deficiency anemia, unspecified: Secondary | ICD-10-CM | POA: Insufficient documentation

## 2019-08-08 DIAGNOSIS — S62316A Displaced fracture of base of fifth metacarpal bone, right hand, initial encounter for closed fracture: Secondary | ICD-10-CM | POA: Insufficient documentation

## 2019-08-08 DIAGNOSIS — F17210 Nicotine dependence, cigarettes, uncomplicated: Secondary | ICD-10-CM | POA: Diagnosis not present

## 2019-08-08 DIAGNOSIS — W228XXA Striking against or struck by other objects, initial encounter: Secondary | ICD-10-CM | POA: Insufficient documentation

## 2019-08-08 DIAGNOSIS — K219 Gastro-esophageal reflux disease without esophagitis: Secondary | ICD-10-CM | POA: Insufficient documentation

## 2019-08-08 DIAGNOSIS — Y9371 Activity, boxing: Secondary | ICD-10-CM | POA: Insufficient documentation

## 2019-08-08 DIAGNOSIS — Z79899 Other long term (current) drug therapy: Secondary | ICD-10-CM | POA: Insufficient documentation

## 2019-08-08 HISTORY — PX: CLOSED REDUCTION METACARPAL WITH PERCUTANEOUS PINNING: SHX5613

## 2019-08-08 HISTORY — DX: Iron deficiency anemia, unspecified: D50.9

## 2019-08-08 LAB — POCT PREGNANCY, URINE: Preg Test, Ur: NEGATIVE

## 2019-08-08 SURGERY — CLOSED REDUCTION, FRACTURE, METACARPAL BONE, WITH PERCUTANEOUS PINNING
Anesthesia: General | Site: Finger | Laterality: Right

## 2019-08-08 MED ORDER — MIDAZOLAM HCL 2 MG/2ML IJ SOLN
INTRAMUSCULAR | Status: AC
  Filled 2019-08-08: qty 2

## 2019-08-08 MED ORDER — FENTANYL CITRATE (PF) 250 MCG/5ML IJ SOLN
INTRAMUSCULAR | Status: AC
  Filled 2019-08-08: qty 5

## 2019-08-08 MED ORDER — OXYCODONE HCL 5 MG PO TABS
ORAL_TABLET | ORAL | Status: AC
  Filled 2019-08-08: qty 1

## 2019-08-08 MED ORDER — ORAL CARE MOUTH RINSE: 15.0000 mL | Freq: Once | OROMUCOSAL | Status: AC

## 2019-08-08 MED ORDER — LACTATED RINGERS IV SOLN: INTRAVENOUS | Status: DC

## 2019-08-08 MED ORDER — CEFAZOLIN SODIUM-DEXTROSE 2-4 GM/100ML-% IV SOLN
INTRAVENOUS | Status: AC
  Filled 2019-08-08: qty 100

## 2019-08-08 MED ORDER — HYDROCODONE-ACETAMINOPHEN 5-325 MG PO TABS: ORAL_TABLET | ORAL | 0 refills | Status: DC

## 2019-08-08 MED ORDER — BUPIVACAINE HCL (PF) 0.25 % IJ SOLN
INTRAMUSCULAR | Status: AC
  Filled 2019-08-08: qty 30

## 2019-08-08 MED ORDER — ONDANSETRON HCL 4 MG/2ML IJ SOLN
INTRAMUSCULAR | Status: DC | PRN
  Administered 2019-08-08: 4 mg via INTRAVENOUS

## 2019-08-08 MED ORDER — PROPOFOL 10 MG/ML IV BOLUS
INTRAVENOUS | Status: AC
  Filled 2019-08-08: qty 20

## 2019-08-08 MED ORDER — OXYCODONE HCL 5 MG/5ML PO SOLN: 5.0000 mg | Freq: Once | ORAL | Status: AC | PRN

## 2019-08-08 MED ORDER — BUPIVACAINE HCL (PF) 0.25 % IJ SOLN
INTRAMUSCULAR | Status: DC | PRN
  Administered 2019-08-08: 10 mL

## 2019-08-08 MED ORDER — OXYCODONE HCL 5 MG PO TABS
5.0000 mg | ORAL_TABLET | Freq: Once | ORAL | Status: AC | PRN
  Administered 2019-08-08: 5 mg via ORAL

## 2019-08-08 MED ORDER — PROPOFOL 10 MG/ML IV BOLUS
INTRAVENOUS | Status: DC | PRN
  Administered 2019-08-08: 160 mg via INTRAVENOUS

## 2019-08-08 MED ORDER — 0.9 % SODIUM CHLORIDE (POUR BTL) OPTIME
TOPICAL | Status: DC | PRN
  Administered 2019-08-08: 1000 mL

## 2019-08-08 MED ORDER — CHLORHEXIDINE GLUCONATE 0.12 % MT SOLN: 15.0000 mL | Freq: Once | OROMUCOSAL | Status: AC

## 2019-08-08 MED ORDER — DEXAMETHASONE SODIUM PHOSPHATE 10 MG/ML IJ SOLN
INTRAMUSCULAR | Status: DC | PRN
  Administered 2019-08-08: 10 mg via INTRAVENOUS

## 2019-08-08 MED ORDER — FENTANYL CITRATE (PF) 100 MCG/2ML IJ SOLN
25.0000 ug | INTRAMUSCULAR | Status: DC | PRN
  Administered 2019-08-08 (×2): 25 ug via INTRAVENOUS

## 2019-08-08 MED ORDER — ONDANSETRON HCL 4 MG/2ML IJ SOLN: 4.0000 mg | Freq: Once | INTRAMUSCULAR | Status: DC | PRN

## 2019-08-08 MED ORDER — MIDAZOLAM HCL 5 MG/5ML IJ SOLN
INTRAMUSCULAR | Status: DC | PRN
  Administered 2019-08-08: 2 mg via INTRAVENOUS

## 2019-08-08 MED ORDER — FENTANYL CITRATE (PF) 100 MCG/2ML IJ SOLN
INTRAMUSCULAR | Status: DC | PRN
  Administered 2019-08-08 (×2): 50 ug via INTRAVENOUS

## 2019-08-08 MED ORDER — LIDOCAINE 2% (20 MG/ML) 5 ML SYRINGE
INTRAMUSCULAR | Status: DC | PRN
  Administered 2019-08-08: 40 mg via INTRAVENOUS

## 2019-08-08 MED ORDER — FENTANYL CITRATE (PF) 100 MCG/2ML IJ SOLN
INTRAMUSCULAR | Status: AC
  Filled 2019-08-08: qty 2

## 2019-08-08 MED ORDER — CHLORHEXIDINE GLUCONATE 0.12 % MT SOLN
OROMUCOSAL | Status: AC
  Administered 2019-08-08: 15 mL via OROMUCOSAL
  Filled 2019-08-08: qty 15

## 2019-08-08 MED ORDER — CEFAZOLIN SODIUM-DEXTROSE 2-4 GM/100ML-% IV SOLN
2.0000 g | INTRAVENOUS | Status: AC
  Administered 2019-08-08: 2 g via INTRAVENOUS

## 2019-08-08 SURGICAL SUPPLY — 58 items
BLADE MINI RND TIP GREEN BEAV (BLADE) IMPLANT
BLADE SURG 15 STRL LF DISP TIS (BLADE) ×2 IMPLANT
BLADE SURG 15 STRL SS (BLADE) ×4
BNDG ELASTIC 2X5.8 VLCR STR LF (GAUZE/BANDAGES/DRESSINGS) IMPLANT
BNDG ELASTIC 3X5.8 VLCR STR LF (GAUZE/BANDAGES/DRESSINGS) ×3 IMPLANT
BNDG ESMARK 4X9 LF (GAUZE/BANDAGES/DRESSINGS) ×3 IMPLANT
BNDG GAUZE ELAST 4 BULKY (GAUZE/BANDAGES/DRESSINGS) ×3 IMPLANT
CHLORAPREP W/TINT 26 (MISCELLANEOUS) ×3 IMPLANT
CORD BIPOLAR FORCEPS 12FT (ELECTRODE) ×3 IMPLANT
COVER BACK TABLE 60X90IN (DRAPES) ×3 IMPLANT
COVER MAYO STAND STRL (DRAPES) ×3 IMPLANT
COVER WAND RF STERILE (DRAPES) IMPLANT
CUFF TOURN SGL QUICK 18X4 (TOURNIQUET CUFF) ×3 IMPLANT
DRAPE EXTREMITY T 121X128X90 (DISPOSABLE) ×3 IMPLANT
DRAPE OEC MINIVIEW 54X84 (DRAPES) ×3 IMPLANT
DRAPE SURG 17X23 STRL (DRAPES) ×3 IMPLANT
DRSG XEROFORM 1X8 (GAUZE/BANDAGES/DRESSINGS) ×3 IMPLANT
GAUZE SPONGE 4X4 12PLY STRL (GAUZE/BANDAGES/DRESSINGS) ×3 IMPLANT
GAUZE XEROFORM 1X8 LF (GAUZE/BANDAGES/DRESSINGS) ×3 IMPLANT
GLOVE BIO SURGEON STRL SZ7.5 (GLOVE) ×3 IMPLANT
GLOVE BIOGEL PI IND STRL 8 (GLOVE) ×1 IMPLANT
GLOVE BIOGEL PI INDICATOR 8 (GLOVE) ×2
GOWN STRL REUS W/ TWL LRG LVL3 (GOWN DISPOSABLE) ×1 IMPLANT
GOWN STRL REUS W/TWL LRG LVL3 (GOWN DISPOSABLE) ×2
GOWN STRL REUS W/TWL XL LVL3 (GOWN DISPOSABLE) ×3 IMPLANT
K-WIRE .045 CH (WIRE) ×3
K-WIRE 0.9  100L (WIRE) ×3 IMPLANT
K-WIRE DBL TROCAR .035X4 (WIRE) ×3
K-WIRE DBL TROCAR .045X4 (WIRE) ×3
KWIRE .045 CH (WIRE) ×1 IMPLANT
KWIRE DBL TROCAR .035X4 (WIRE) ×1 IMPLANT
KWIRE DBL TROCAR .045X4 (WIRE) ×1 IMPLANT
NEEDLE HYPO 22GX1.5 SAFETY (NEEDLE) IMPLANT
NEEDLE HYPO 25X1 1.5 SAFETY (NEEDLE) IMPLANT
NS IRRIG 1000ML POUR BTL (IV SOLUTION) ×3 IMPLANT
PAD CAST 3X4 CTTN HI CHSV (CAST SUPPLIES) ×1 IMPLANT
PAD CAST 4YDX4 CTTN HI CHSV (CAST SUPPLIES) IMPLANT
PADDING CAST ABS 4INX4YD NS (CAST SUPPLIES) ×2
PADDING CAST ABS COTTON 4X4 ST (CAST SUPPLIES) ×1 IMPLANT
PADDING CAST COTTON 3X4 STRL (CAST SUPPLIES) ×3
PADDING CAST COTTON 4X4 STRL (CAST SUPPLIES)
SET BASIN DAY SURGERY F.S. (CUSTOM PROCEDURE TRAY) ×3 IMPLANT
SLEEVE SCD COMPRESS KNEE MED (MISCELLANEOUS) IMPLANT
SPLINT PLASTER CAST XFAST 3X15 (CAST SUPPLIES) IMPLANT
SPLINT PLASTER CAST XFAST 4X15 (CAST SUPPLIES) IMPLANT
SPLINT PLASTER XTRA FAST SET 4 (CAST SUPPLIES)
SPLINT PLASTER XTRA FASTSET 3X (CAST SUPPLIES)
STOCKINETTE 4X48 STRL (DRAPES) ×3 IMPLANT
SUT ETHILON 3 0 PS 1 (SUTURE) IMPLANT
SUT ETHILON 4 0 PS 2 18 (SUTURE) ×3 IMPLANT
SUT MERSILENE 4 0 P 3 (SUTURE) IMPLANT
SUT VIC AB 3-0 PS1 18 (SUTURE)
SUT VIC AB 3-0 PS1 18XBRD (SUTURE) IMPLANT
SUT VICRYL 4-0 PS2 18IN ABS (SUTURE) IMPLANT
SYR BULB EAR ULCER 3OZ GRN STR (SYRINGE) ×3 IMPLANT
SYR CONTROL 10ML LL (SYRINGE) IMPLANT
TOWEL GREEN STERILE FF (TOWEL DISPOSABLE) ×6 IMPLANT
UNDERPAD 30X36 HEAVY ABSORB (UNDERPADS AND DIAPERS) ×3 IMPLANT

## 2019-08-08 NOTE — Progress Notes (Signed)
Orthopedic Tech Progress Note Patient Details:  Kimberly Gibson [birth year] [id number] PACU RN called requesting a SMALL or MEDIUM ARM SLING. Ortho Devices Type of Ortho Device: Arm sling Ortho Device/Splint Location: RUE Ortho Device/Splint Interventions: Application   Post Interventions Patient Tolerated: Well Instructions Provided: Care of device   Janit Pagan 08/08/2019, 4:55 PM

## 2019-08-08 NOTE — Anesthesia Postprocedure Evaluation (Signed)
Anesthesia Post Note  Patient: Kimberly Gibson  Procedure(s) Performed: CLOSED REDUCTION WITH PERCUTANEOUS PINNING RIGHT SMALL METACARPAL BASE FRACTURE (Right Finger)     Patient location during evaluation: PACU Anesthesia Type: General Level of consciousness: awake and alert Pain management: pain level controlled Vital Signs Assessment: post-procedure vital signs reviewed and stable Respiratory status: spontaneous breathing, nonlabored ventilation, respiratory function stable and patient connected to nasal cannula oxygen Cardiovascular status: blood pressure returned to baseline and stable Postop Assessment: no apparent nausea or vomiting Anesthetic complications: no   No complications documented.  Last Vitals:  Vitals:   08/08/19 1619 08/08/19 1620  BP:  121/79  Pulse: 77 66  Resp: (!) 21 18  Temp: 36.4 C 36.4 C  SpO2: 95% 92%    Last Pain:  Vitals:   08/08/19 1620  TempSrc:   PainSc: 0-No pain                 [name]

## 2019-08-08 NOTE — Op Note (Addendum)
NAME: Kimberly BENNETTE MEDICAL RECORD NO: [id number] DATE OF BIRTH: [birth year] FACILITY: [facility] LOCATION: [facility] OR PHYSICIAN: Tennis Must, MD   OPERATIVE REPORT   DATE OF PROCEDURE: 08/08/19    PREOPERATIVE DIAGNOSIS:   Right small finger metacarpal base fracture   POSTOPERATIVE DIAGNOSIS:   Right small finger metacarpal base fracture   PROCEDURE:   Closed reduction pin fixation right small finger metacarpal base fracture   SURGEON:  Leanora Cover, M.D.   ASSISTANT: none   ANESTHESIA:  General   INTRAVENOUS FLUIDS:  Per anesthesia flow sheet.   ESTIMATED BLOOD LOSS:  Minimal.   COMPLICATIONS:  None.   SPECIMENS:  none   TOURNIQUET TIME:    Total Tourniquet Time Documented: Upper Arm (Right) - 36 minutes Total: Upper Arm (Right) - 36 minutes    DISPOSITION:  Stable to PACU.   INDICATIONS: 24 year old female states she injured her right hand while boxing and hit a wall.  She was seen at the emergency department where radiographs were taken revealing a fracture of the base of the small finger metacarpal.  She was placed in a splint and follow-up in the office.  She wishes to proceed with operative fixation. Risks, benefits and alternatives of surgery were discussed including the risks of blood loss, infection, damage to nerves, vessels, tendons, ligaments, bone for surgery, need for additional surgery, complications with wound healing, continued pain, nonunion, malunion, stiffness.  She voiced understanding of these risks and elected to proceed.  OPERATIVE COURSE:  After being identified preoperatively by myself,  the patient and I agreed on the procedure and site of the procedure.  The surgical site was marked.  Surgical consent had been signed. She was given IV antibiotics as preoperative antibiotic prophylaxis. She was transferred to the operating room and placed on the operating table in supine position with the Right upper extremity on an arm board.  General anesthesia was  induced by the anesthesiologist.  Right upper extremity was prepped and draped in normal sterile orthopedic fashion.  A surgical pause was performed between the surgeons, anesthesia, and operating room staff and all were in agreement as to the patient, procedure, and site of procedure.  Tourniquet at the proximal aspect of the extremity was inflated to 250 mmHg after exsanguination of the arm with an Esmarch bandage.    C-arm was used in AP lateral oblique projections throughout the case.  Close reduction of the small finger metacarpal base was performed.  There was a intra-articular fragment that was difficult to reduce.  A 0.045 inch K wire was used through the skin to help mobilize this fragment and reduce it.  Once it was adequately reduced a 0.035 inch K wire was advanced across the bases of the small and ring finger metacarpals.  An additional 0.035 inch K wire was advanced from the small finger metacarpal into the ring finger metacarpal distal to the fracture.  A third 0.035 inch K wire was advanced in an oblique fashion from the small finger metacarpal and into the hamate.  This is adequate to stabilize the fracture.  C-arm was used in AP lateral bleak projections to ensure appropriate reduction position of heart which was the case.  The wrist was placed through tenodesis and there was no scissoring of the digits.  The pins were bent and cut short.  The pin sites were injected with quarter percent plain Marcaine to aid in postoperative analgesia.  They were dressed with sterile Xeroform 4 x 4's and wrapped  with a Kerlix bandage.  A volar and dorsal slab splint including the long ring and small fingers was placed with the MPs flexed and the IP is extended.  This was wrapped with Kerlix and Ace bandage.  The tourniquet was deflated at 36 minutes.  Fingertips were pink with brisk capillary refill after deflation of tourniquet.  The operative  drapes were broken down.  The patient was awoken from anesthesia  safely.  She was transferred back to the stretcher and taken to PACU in stable condition.  I will see her back in the office in 1 week for postoperative followup.  I will give her a prescription for Norco 5/325 1-2 tabs PO q6 hours prn pain, dispense # 20.   Leanora Cover, MD Electronically signed, 08/08/19

## 2019-08-08 NOTE — Anesthesia Preprocedure Evaluation (Signed)
Anesthesia Evaluation  Patient identified by MRN, date of birth, ID band Patient awake    Reviewed: Allergy & Precautions, NPO status , Patient's Chart, lab work & pertinent test results  Airway Mallampati: II  TM Distance: >3 FB Neck ROM: Full    Dental  (+) Teeth Intact, Dental Advisory Given   Pulmonary Current Smoker and Patient abstained from smoking.,    breath sounds clear to auscultation       Cardiovascular  Rhythm:Regular Rate:Normal     Neuro/Psych    GI/Hepatic   Endo/Other    Renal/GU      Musculoskeletal   Abdominal   Peds  Hematology   Anesthesia Other Findings   Reproductive/Obstetrics                             Anesthesia Physical Anesthesia Plan  ASA: III  Anesthesia Plan: General   Post-op Pain Management:    Induction: Intravenous  PONV Risk Score and Plan: Ondansetron and Dexamethasone  Airway Management Planned: LMA  Additional Equipment:   Intra-op Plan:   Post-operative Plan: Extubation in OR  Informed Consent: I have reviewed the patients History and Physical, chart, labs and discussed the procedure including the risks, benefits and alternatives for the proposed anesthesia with the patient or authorized representative who has indicated his/her understanding and acceptance.     Dental advisory given  Plan Discussed with: CRNA and Anesthesiologist  Anesthesia Plan Comments: (Closed Fx R. 5th metacarpal Chronic anemia presmably due to iron deficiency, asymptomatic  Plan GA with LMA)        Anesthesia Quick Evaluation

## 2019-08-08 NOTE — Discharge Instructions (Signed)

## 2019-08-08 NOTE — Transfer of Care (Signed)
Immediate Anesthesia Transfer of Care Note  Patient: Kimberly Gibson  Procedure(s) Performed: CLOSED REDUCTION WITH PERCUTANEOUS PINNING RIGHT SMALL METACARPAL BASE FRACTURE (Right Finger)  Patient Location: PACU  Anesthesia Type:General  Level of Consciousness: awake, alert  and oriented  Airway & Oxygen Therapy: Patient Spontanous Breathing  Post-op Assessment: Report given to RN, Post -op Vital signs reviewed and stable and Patient moving all extremities  Post vital signs: Reviewed and stable  Last Vitals:  Vitals Value Taken Time  BP 116/80 08/08/19 1547  Temp 36.3 C 08/08/19 1547  Pulse 95 08/08/19 1552  Resp 22 08/08/19 1552  SpO2 93 % 08/08/19 1552  Vitals shown include unvalidated device data.  Last Pain:  Vitals:   08/08/19 1547  TempSrc:   PainSc: 0-No pain      Patients Stated Pain Goal: 3 (49/35/52 1747)  Complications: No complications documented.

## 2019-08-08 NOTE — Anesthesia Procedure Notes (Signed)
Procedure Name: LMA Insertion Date/Time: 08/08/2019 2:42 PM Performed by: Kyung Rudd, CRNA Pre-anesthesia Checklist: Patient identified, Emergency Drugs available, Suction available and Patient being monitored Patient Re-evaluated:Patient Re-evaluated prior to induction Oxygen Delivery Method: Circle system utilized Preoxygenation: Pre-oxygenation with 100% oxygen Induction Type: IV induction LMA: LMA inserted LMA Size: 4.0 Number of attempts: 1 Placement Confirmation: positive ETCO2 and breath sounds checked- equal and bilateral Tube secured with: Tape Dental Injury: Teeth and Oropharynx as per pre-operative assessment

## 2019-08-08 NOTE — H&P (Signed)
Kimberly Gibson is an 24 y.o. female.   Chief Complaint: right hand fracture HPI: 24 yo female states she inadvertently hit a wall ~ 1 week ago injuring right hand.  Seen at [facility] where XR revealed right small metacarpal base fracture.  Splinted and followed up in office.  She wishes to proceed with operative fixation.  Allergies: No Known Allergies  Past Medical History:  Diagnosis Date  . BV (bacterial vaginosis)   . GERD (gastroesophageal reflux disease)   . Microcytic anemia   . Normal pregnancy in multigravida, antepartum 12/11/2017  . SVD (spontaneous vaginal delivery) 12/12/2017    Past Surgical History:  Procedure Laterality Date  . WISDOM TOOTH EXTRACTION      Family History: Family History  Problem Relation Age of Onset  . Asthma Sister   . Hearing loss Neg Hx     Social History:   reports that she has been smoking cigarettes and cigars. She has been smoking about 2.00 packs per day. She has never used smokeless tobacco. She reports that she does not drink alcohol and does not use drugs.  Medications: Medications Prior to Admission  Medication Sig Dispense Refill  . ibuprofen (ADVIL) 800 MG tablet Take 1 tablet (800 mg total) by mouth 3 (three) times daily with meals. 21 tablet 0  . acetaminophen (TYLENOL) 325 MG tablet Take 2 tablets (650 mg total) by mouth every 4 (four) hours as needed (for pain scale < 4). 30 tablet 1  . ferrous sulfate 325 (65 FE) MG tablet Take 1 tablet (325 mg total) by mouth 2 (two) times daily with a meal. 60 tablet 1  . metroNIDAZOLE (FLAGYL) 500 MG tablet Take 1 tablet (500 mg total) by mouth 2 (two) times daily. (Patient not taking: Reported on 08/07/2019) 14 tablet 0    Results for orders placed or performed during the hospital encounter of 08/08/19 (from the past 48 hour(s))  CBC     Status: Abnormal   Collection Time: 08/07/19 12:28 PM  Result Value Ref Range   WBC 5.4 4.0 - 10.5 K/uL    Comment: WHITE COUNT CONFIRMED ON SMEAR   RBC  3.24 (L) 3.87 - 5.11 MIL/uL   Hemoglobin 5.6 (LL) 12.0 - 15.0 g/dL    Comment: REPEATED TO VERIFY Reticulocyte Hemoglobin testing may be clinically indicated, consider ordering this additional test [id number] THIS CRITICAL RESULT HAS VERIFIED AND BEEN CALLED TO JAMES BURNS,PA BY ZELDA BEECH ON 06 10 2021 AT 7124, AND HAS BEEN READ BACK.     HCT 21.6 (L) 36 - 46 %   MCV 66.7 (L) 80.0 - 100.0 fL   MCH 17.3 (L) 26.0 - 34.0 pg   MCHC 25.9 (L) 30.0 - 36.0 g/dL   RDW 22.7 (H) 11.5 - 15.5 %   Platelets 842 (H) 150 - 400 K/uL    Comment: REPEATED TO VERIFY   nRBC 0.0 0.0 - 0.2 %    Comment: Performed at [facility] [address], [place], [place] [address]  Pregnancy, urine POC     Status: None   Collection Time: 08/08/19 10:56 AM  Result Value Ref Range   Preg Test, Ur NEGATIVE NEGATIVE    Comment:        THE SENSITIVITY OF THIS METHODOLOGY IS >24 mIU/mL     No results found.   A comprehensive review of systems was negative.  Blood pressure 127/68, pulse 87, temperature 98.1 F (36.7 C), temperature source Oral, resp. rate 18, height  5\' 4"  (1.626 m), weight 54 kg, last menstrual period 07/27/2019, SpO2 100 %, unknown if currently breastfeeding.  General appearance: alert, cooperative and appears stated age Head: Normocephalic, without obvious abnormality, atraumatic Neck: supple, symmetrical, trachea midline Cardio: regular rate and rhythm Resp: clear to auscultation bilaterally Extremities: Intact sensation and capillary refill all digits.  +epl/fpl/io.  No wounds.  Pulses: 2+ and symmetric Skin: Skin color, texture, turgor normal. No rashes or lesions Neurologic: Grossly normal Incision/Wound: none  Assessment/Plan Right small metacarpal base fracture.  Plan operative fixation.  Patient has low hemoglobin.  Procedure with minimal blood loss.  She is advised that this should be checked and addressed by primary physician.  Non operative and operative treatment options  have been discussed with the patient and patient wishes to proceed with operative treatment. Risks, benefits, and alternatives of surgery have been discussed and the patient agrees with the plan of care.   Leanora Cover 08/08/2019, 1:05 PM ]

## 2019-08-11 ENCOUNTER — Encounter (HOSPITAL_COMMUNITY): Payer: Self-pay | Admitting: Orthopedic Surgery

## 2019-10-14 ENCOUNTER — Other Ambulatory Visit: Payer: Self-pay

## 2019-11-18 ENCOUNTER — Other Ambulatory Visit: Payer: Self-pay

## 2019-11-18 ENCOUNTER — Ambulatory Visit (HOSPITAL_COMMUNITY)
Admission: EM | Admit: 2019-11-18 | Discharge: 2019-11-18 | Disposition: A | Discharge status: Home / Self Care | Payer: Medicaid Other | Attending: Family Medicine | Admitting: Family Medicine

## 2019-11-18 ENCOUNTER — Encounter (HOSPITAL_COMMUNITY): Payer: Self-pay | Admitting: Emergency Medicine

## 2019-11-18 DIAGNOSIS — Z3202 Encounter for pregnancy test, result negative: Secondary | ICD-10-CM | POA: Diagnosis not present

## 2019-11-18 DIAGNOSIS — R5383 Other fatigue: Secondary | ICD-10-CM

## 2019-11-18 DIAGNOSIS — R42 Dizziness and giddiness: Secondary | ICD-10-CM

## 2019-11-18 LAB — CBC
HCT: 28.5 % — ABNORMAL LOW (ref 36.0–46.0)
Hemoglobin: 7.5 g/dL — ABNORMAL LOW (ref 12.0–15.0)
MCH: 17.6 pg — ABNORMAL LOW (ref 26.0–34.0)
MCHC: 26.3 g/dL — ABNORMAL LOW (ref 30.0–36.0)
MCV: 66.7 fL — ABNORMAL LOW (ref 80.0–100.0)
Platelets: 206 10*3/uL (ref 150–400)
RBC: 4.27 MIL/uL (ref 3.87–5.11)
RDW: 21.3 % — ABNORMAL HIGH (ref 11.5–15.5)
WBC: 4 10*3/uL (ref 4.0–10.5)
nRBC: 0 % (ref 0.0–0.2)

## 2019-11-18 LAB — POCT URINALYSIS DIPSTICK, ED / UC
Bilirubin Urine: NEGATIVE
Glucose, UA: NEGATIVE mg/dL
Hgb urine dipstick: NEGATIVE
Ketones, ur: NEGATIVE mg/dL
Leukocytes,Ua: NEGATIVE
Nitrite: NEGATIVE
Protein, ur: NEGATIVE mg/dL
Specific Gravity, Urine: 1.025 (ref 1.005–1.030)
Urobilinogen, UA: 1 mg/dL (ref 0.0–1.0)
pH: 6.5 (ref 5.0–8.0)

## 2019-11-18 LAB — POC URINE PREG, ED: Preg Test, Ur: NEGATIVE

## 2019-11-18 NOTE — Discharge Instructions (Signed)
Your urine did not show any infection or pregnancy.  We are checking your hemoglobin.  We will call with any abnormal results.  I recommend taking the iron supplements. Otherwise this could be some sort of virus Follow-up with your primary care for further problems

## 2019-11-18 NOTE — ED Triage Notes (Signed)
Patient presents to [facility] for assessment of dizziness, fatigue,and generalized abdominal pain x 1.5 weeks.  Patient states hx of severe anemia and syncope, and is concerned.  States she normally does not take her iron supplement.

## 2019-11-18 NOTE — ED Provider Notes (Signed)
[place]    CSN: [id number] Arrival date & time: 11/18/19  8916      History   Chief Complaint Chief Complaint  Patient presents with  . Dizziness  . Fatigue    HPI Kimberly Gibson is a 24 y.o. female.   Patient is a 24 year old female presents today with generalized abdominal cramping, dizziness, fatigue, nausea x1/2 weeks.  Concern due to history of severe anemia.  She takes the iron supplements off and on.  Last menstrual period was on August 1.  She is currently sexually active.  Denies any vaginal discharge, itching, irritation, dysuria, hematuria urinary frequency.  No fevers, vomiting or diarrhea.     Past Medical History:  Diagnosis Date  . BV (bacterial vaginosis)   . GERD (gastroesophageal reflux disease)   . Microcytic anemia   . Normal pregnancy in multigravida, antepartum 12/11/2017  . SVD (spontaneous vaginal delivery) 12/12/2017    Patient Active Problem List   Diagnosis Date Noted  . Normal pregnancy in multigravida in third trimester 12/12/2017  . SVD (spontaneous vaginal delivery) 12/12/2017  . Normal pregnancy in multigravida, antepartum 12/11/2017  . No leakage of amniotic fluid into vagina 11/27/2017  . Adjustment disorder with mixed anxiety and depressed mood 08/28/2015  . GERD (gastroesophageal reflux disease) 08/27/2015  . Overdose of iron or iron compound 08/27/2015  . Microcytic anemia 08/27/2015  . Normal pregnancy, first 09/18/2013    Past Surgical History:  Procedure Laterality Date  . CLOSED REDUCTION METACARPAL WITH PERCUTANEOUS PINNING Right 08/08/2019   Procedure: CLOSED REDUCTION WITH PERCUTANEOUS PINNING RIGHT SMALL METACARPAL BASE FRACTURE;  Surgeon: Leanora Cover, MD;  Location: [place];  Service: Orthopedics;  Laterality: Right;  . WISDOM TOOTH EXTRACTION      OB History    Gravida  4   Para  2   Term  2   Preterm  0   AB  1   Living  2     SAB  1   TAB  0   Ectopic  0   Multiple  0   Live Births    2            Home Medications    Prior to Admission medications   Medication Sig Start Date End Date Taking? Authorizing Provider  acetaminophen (TYLENOL) 325 MG tablet Take 2 tablets (650 mg total) by mouth every 4 (four) hours as needed (for pain scale < 4). 12/14/17   Paula Compton, MD  ferrous sulfate 325 (65 FE) MG tablet Take 1 tablet (325 mg total) by mouth 2 (two) times daily with a meal. 07/28/19   Veryl Speak, MD  ibuprofen (ADVIL) 800 MG tablet Take 1 tablet (800 mg total) by mouth 3 (three) times daily with meals. 08/02/19   Vanessa Kick, MD    Family History Family History  Problem Relation Age of Onset  . Asthma Sister   . Hearing loss Neg Hx     Social History Social History   Tobacco Use  . Smoking status: Current Some Day Smoker    Packs/day: 2.00    Types: Cigarettes, Cigars  . Smokeless tobacco: Never Used  . Tobacco comment: Black and off, on and off 2 years  Vaping Use  . Vaping Use: Never used  Substance Use Topics  . Alcohol use: No  . Drug use: No     Allergies   Patient has no known allergies.   Review of Systems Review of Systems   Physical  Exam Triage Vital Signs ED Triage Vitals  Enc Vitals Group     BP 11/18/19 0825 121/79     Pulse Rate 11/18/19 0825 70     Resp 11/18/19 0825 18     Temp 11/18/19 0825 98.4 F (36.9 C)     Temp Source 11/18/19 0825 Oral     SpO2 11/18/19 0825 100 %     Weight --      Height --      Head Circumference --      Peak Flow --      Pain Score 11/18/19 0826 3     Pain Loc --      Pain Edu? --      Excl. in [place]? --    No data found.  Updated Vital Signs BP 121/79 (BP Location: Right Arm)   Pulse 70   Temp 98.4 F (36.9 C) (Oral)   Resp 18   LMP 10/29/2019   SpO2 100%   Visual Acuity Right Eye Distance:   Left Eye Distance:   Bilateral Distance:    Right Eye Near:   Left Eye Near:    Bilateral Near:     Physical Exam Vitals and nursing note reviewed.  Constitutional:       General: She is not in acute distress.    Appearance: Normal appearance. She is not ill-appearing, toxic-appearing or diaphoretic.  HENT:     Head: Normocephalic.     Nose: Nose normal.     Mouth/Throat:     Pharynx: Oropharynx is clear.  Eyes:     Conjunctiva/sclera: Conjunctivae normal.  Pulmonary:     Effort: Pulmonary effort is normal.  Musculoskeletal:        General: Normal range of motion.     Cervical back: Normal range of motion.  Skin:    General: Skin is warm and dry.     Findings: No rash.  Neurological:     Mental Status: She is alert.  Psychiatric:        Mood and Affect: Mood normal.      UC Treatments / Results  Labs (all labs ordered are listed, but only abnormal results are displayed) Labs Reviewed  CBC - Abnormal; Notable for the following components:      Result Value   Hemoglobin 7.5 (*)    HCT 28.5 (*)    MCV 66.7 (*)    MCH 17.6 (*)    MCHC 26.3 (*)    RDW 21.3 (*)    All other components within normal limits  POC URINE PREG, ED  POCT URINALYSIS DIPSTICK, ED / UC    EKG   Radiology No results found.  Procedures Procedures (including critical care time)  Medications Ordered in UC Medications - No data to display  Initial Impression / Assessment and Plan / UC Course  I have reviewed the triage vital signs and the nursing notes.  Pertinent labs & imaging results that were available during my care of the patient were reviewed by me and considered in my medical decision making (see chart for details).     Fatigue, dizziness Urine without infection or pregnancy today. CBC with hemoglobin of 7.5. Patient called and results relayed.  Recommended restart her iron daily  and for any continued or worsening symptoms that she will need to go to the ER. Pt understanding and agreed   Final Clinical Impressions(s) / UC Diagnoses   Final diagnoses:  Fatigue, unspecified type  Dizziness  Discharge Instructions     Your urine  did not show any infection or pregnancy.  We are checking your hemoglobin.  We will call with any abnormal results.  I recommend taking the iron supplements. Otherwise this could be some sort of virus Follow-up with your primary care for further problems    ED Prescriptions    None     PDMP not reviewed this encounter.   Orvan July, NP 11/18/19 1447

## 2019-12-03 ENCOUNTER — Ambulatory Visit (HOSPITAL_COMMUNITY): Payer: Self-pay

## 2019-12-05 ENCOUNTER — Other Ambulatory Visit: Payer: Self-pay

## 2019-12-05 ENCOUNTER — Ambulatory Visit (HOSPITAL_COMMUNITY)
Admission: EM | Admit: 2019-12-05 | Discharge: 2019-12-05 | Disposition: A | Discharge status: Home / Self Care | Payer: Medicaid Other | Attending: Family Medicine | Admitting: Family Medicine

## 2019-12-05 ENCOUNTER — Encounter (HOSPITAL_COMMUNITY): Payer: Self-pay | Admitting: Emergency Medicine

## 2019-12-05 DIAGNOSIS — N898 Other specified noninflammatory disorders of vagina: Secondary | ICD-10-CM | POA: Diagnosis not present

## 2019-12-05 MED ORDER — METRONIDAZOLE 500 MG PO TABS: 500.0000 mg | ORAL_TABLET | Freq: Two times a day (BID) | ORAL | 0 refills | Status: DC

## 2019-12-05 NOTE — ED Triage Notes (Signed)
PT has history of BV. Used a new soap and developed vaginal discharge 2 days ago.

## 2019-12-05 NOTE — ED Provider Notes (Signed)
[place]    CSN: [id number] Arrival date & time: 12/05/19  0800      History   Chief Complaint Chief Complaint  Patient presents with  . Vaginal Discharge    HPI Kimberly Gibson is a 24 y.o. female.   24 year old female past medical history of BV.  She presents today for vaginal discharge similar to previous BV infections.  Reporting she used a new soap and developed this approximate 2 days ago.  Denies any itching, irritation, abdominal pain, dysuria, hematuria or urinary frequency. Patient's last menstrual period was 11/22/2019.      Past Medical History:  Diagnosis Date  . BV (bacterial vaginosis)   . GERD (gastroesophageal reflux disease)   . Microcytic anemia   . Normal pregnancy in multigravida, antepartum 12/11/2017  . SVD (spontaneous vaginal delivery) 12/12/2017    Patient Active Problem List   Diagnosis Date Noted  . Normal pregnancy in multigravida in third trimester 12/12/2017  . SVD (spontaneous vaginal delivery) 12/12/2017  . Normal pregnancy in multigravida, antepartum 12/11/2017  . No leakage of amniotic fluid into vagina 11/27/2017  . Adjustment disorder with mixed anxiety and depressed mood 08/28/2015  . GERD (gastroesophageal reflux disease) 08/27/2015  . Overdose of iron or iron compound 08/27/2015  . Microcytic anemia 08/27/2015  . Normal pregnancy, first 09/18/2013    Past Surgical History:  Procedure Laterality Date  . CLOSED REDUCTION METACARPAL WITH PERCUTANEOUS PINNING Right 08/08/2019   Procedure: CLOSED REDUCTION WITH PERCUTANEOUS PINNING RIGHT SMALL METACARPAL BASE FRACTURE;  Surgeon: Leanora Cover, MD;  Location: [place];  Service: Orthopedics;  Laterality: Right;  . WISDOM TOOTH EXTRACTION      OB History    Gravida  4   Para  2   Term  2   Preterm  0   AB  1   Living  2     SAB  1   TAB  0   Ectopic  0   Multiple  0   Live Births  2            Home Medications    Prior to Admission medications    Medication Sig Start Date End Date Taking? Authorizing Provider  acetaminophen (TYLENOL) 325 MG tablet Take 2 tablets (650 mg total) by mouth every 4 (four) hours as needed (for pain scale < 4). 12/14/17   Paula Compton, MD  ferrous sulfate 325 (65 FE) MG tablet Take 1 tablet (325 mg total) by mouth 2 (two) times daily with a meal. 07/28/19   Veryl Speak, MD  ibuprofen (ADVIL) 800 MG tablet Take 1 tablet (800 mg total) by mouth 3 (three) times daily with meals. 08/02/19   Vanessa Kick, MD  metroNIDAZOLE (FLAGYL) 500 MG tablet Take 1 tablet (500 mg total) by mouth 2 (two) times daily. 12/05/19   Orvan July, NP    Family History Family History  Problem Relation Age of Onset  . Asthma Sister   . Hearing loss Neg Hx     Social History Social History   Tobacco Use  . Smoking status: Current Some Day Smoker    Packs/day: 2.00    Types: Cigarettes, Cigars  . Smokeless tobacco: Never Used  . Tobacco comment: Black and off, on and off 2 years  Vaping Use  . Vaping Use: Never used  Substance Use Topics  . Alcohol use: No  . Drug use: No     Allergies   Patient has no known allergies.  Review of Systems Review of Systems   Physical Exam Triage Vital Signs ED Triage Vitals  Enc Vitals Group     BP 12/05/19 0821 96/60     Pulse Rate 12/05/19 0821 95     Resp 12/05/19 0821 16     Temp 12/05/19 0821 98.3 F (36.8 C)     Temp Source 12/05/19 0821 Oral     SpO2 12/05/19 0821 98 %     Weight --      Height --      Head Circumference --      Peak Flow --      Pain Score 12/05/19 0820 0     Pain Loc --      Pain Edu? --      Excl. in [place]? --    No data found.  Updated Vital Signs BP 96/60   Pulse 95   Temp 98.3 F (36.8 C) (Oral)   Resp 16   LMP 11/22/2019   SpO2 98%   Visual Acuity Right Eye Distance:   Left Eye Distance:   Bilateral Distance:    Right Eye Near:   Left Eye Near:    Bilateral Near:     Physical Exam Vitals and nursing note  reviewed.  Constitutional:      General: She is not in acute distress.    Appearance: Normal appearance. She is not ill-appearing, toxic-appearing or diaphoretic.  HENT:     Head: Normocephalic.     Nose: Nose normal.  Eyes:     Conjunctiva/sclera: Conjunctivae normal.  Pulmonary:     Effort: Pulmonary effort is normal.  Musculoskeletal:        General: Normal range of motion.     Cervical back: Normal range of motion.  Skin:    General: Skin is warm and dry.     Findings: No rash.  Neurological:     Mental Status: She is alert.  Psychiatric:        Mood and Affect: Mood normal.      UC Treatments / Results  Labs (all labs ordered are listed, but only abnormal results are displayed) Labs Reviewed  CERVICOVAGINAL ANCILLARY ONLY    EKG   Radiology No results found.  Procedures Procedures (including critical care time)  Medications Ordered in UC Medications - No data to display  Initial Impression / Assessment and Plan / UC Course  I have reviewed the triage vital signs and the nursing notes.  Pertinent labs & imaging results that were available during my care of the patient were reviewed by me and considered in my medical decision making (see chart for details).     Vaginal discharge Treating for BV based on symptoms and history. Swab sent for testing Follow up as needed for continued or worsening symptoms  Final Clinical Impressions(s) / UC Diagnoses   Final diagnoses:  Vaginal discharge     Discharge Instructions     Treating you for BV  Medicine as prescribed Swab sent for testing You can check my chart for results.     ED Prescriptions    Medication Sig Dispense Auth. Provider   metroNIDAZOLE (FLAGYL) 500 MG tablet Take 1 tablet (500 mg total) by mouth 2 (two) times daily. 14 tablet Demir Titsworth A, NP     PDMP not reviewed this encounter.   Orvan July, NP 12/05/19 [phone]

## 2019-12-05 NOTE — Discharge Instructions (Signed)
Treating you for BV  Medicine as prescribed Swab sent for testing You can check my chart for results.

## 2019-12-07 LAB — CERVICOVAGINAL ANCILLARY ONLY
Bacterial Vaginitis (gardnerella): POSITIVE — AB
Candida Glabrata: NEGATIVE
Candida Vaginitis: POSITIVE — AB
Chlamydia: NEGATIVE
Comment: NEGATIVE
Comment: NEGATIVE
Comment: NEGATIVE
Comment: NEGATIVE
Comment: NEGATIVE
Comment: NORMAL
Neisseria Gonorrhea: POSITIVE — AB
Trichomonas: NEGATIVE

## 2019-12-08 ENCOUNTER — Telehealth (HOSPITAL_COMMUNITY): Payer: Self-pay | Admitting: Emergency Medicine

## 2019-12-08 MED ORDER — FLUCONAZOLE 150 MG PO TABS: 150.0000 mg | ORAL_TABLET | Freq: Once | ORAL | 0 refills | Status: AC

## 2019-12-09 ENCOUNTER — Other Ambulatory Visit: Payer: Self-pay

## 2019-12-09 ENCOUNTER — Ambulatory Visit (HOSPITAL_COMMUNITY)
Admission: RE | Admit: 2019-12-09 | Discharge: 2019-12-09 | Disposition: A | Discharge status: Home / Self Care | Payer: Medicaid Other | Source: Ambulatory Visit | Attending: Family Medicine | Admitting: Family Medicine

## 2019-12-09 DIAGNOSIS — A5400 Gonococcal infection of lower genitourinary tract, unspecified: Secondary | ICD-10-CM

## 2019-12-09 MED ORDER — LIDOCAINE HCL (PF) 1 % IJ SOLN
INTRAMUSCULAR | Status: AC
  Filled 2019-12-09: qty 2

## 2019-12-09 MED ORDER — CEFTRIAXONE SODIUM 500 MG IJ SOLR
INTRAMUSCULAR | Status: AC
  Filled 2019-12-09: qty 500

## 2019-12-09 MED ORDER — CEFTRIAXONE SODIUM 500 MG IJ SOLR
500.0000 mg | Freq: Once | INTRAMUSCULAR | Status: AC
  Administered 2019-12-09: 500 mg via INTRAMUSCULAR

## 2019-12-09 NOTE — ED Triage Notes (Signed)
Patient presents to [facility] for treatment of recent positive Gonorrhea

## 2019-12-09 NOTE — ED Notes (Signed)
Reviewed safe sex practices, notifying partners, and refraining from sexual activities for 7 days from time of treatment.  Patient verified understanding, all questions answered.

## 2020-01-14 ENCOUNTER — Ambulatory Visit (HOSPITAL_COMMUNITY): Payer: Self-pay

## 2020-03-10 ENCOUNTER — Ambulatory Visit (HOSPITAL_COMMUNITY): Payer: Self-pay

## 2020-03-18 ENCOUNTER — Ambulatory Visit (HOSPITAL_COMMUNITY)
Admission: RE | Admit: 2020-03-18 | Discharge: 2020-03-18 | Disposition: A | Discharge status: Home / Self Care | Payer: Medicaid Other | Source: Ambulatory Visit | Attending: Family Medicine | Admitting: Family Medicine

## 2020-03-18 ENCOUNTER — Encounter (HOSPITAL_COMMUNITY): Payer: Self-pay

## 2020-03-18 ENCOUNTER — Other Ambulatory Visit: Payer: Self-pay

## 2020-03-18 VITALS — BP 100/66 | HR 80 | Temp 98.3°F | Resp 18

## 2020-03-18 DIAGNOSIS — Z113 Encounter for screening for infections with a predominantly sexual mode of transmission: Secondary | ICD-10-CM | POA: Diagnosis present

## 2020-03-18 DIAGNOSIS — L209 Atopic dermatitis, unspecified: Secondary | ICD-10-CM | POA: Diagnosis not present

## 2020-03-18 LAB — HIV ANTIBODY (ROUTINE TESTING W REFLEX): HIV Screen 4th Generation wRfx: NONREACTIVE

## 2020-03-18 MED ORDER — FLUOCINONIDE 0.05 % EX CREA
1.0000 | TOPICAL_CREAM | Freq: Two times a day (BID) | CUTANEOUS | 1 refills | Status: DC
Start: 2020-03-18 — End: 2020-10-06

## 2020-03-18 NOTE — Discharge Instructions (Signed)
We have sent testing for sexually transmitted infections. We will notify you of any positive results once they are received. If required, we will prescribe any medications you might need.  Please refrain from all sexual activity for at least the next seven days.  

## 2020-03-18 NOTE — ED Provider Notes (Signed)
[place] [facility]   [id number] 03/18/20 Arrival Time: 4098  ASSESSMENT & PLAN:  1. Atopic dermatitis, unspecified type   2. Screening for STDs (sexually transmitted diseases)     Meds ordered this encounter  Medications  . fluocinonide cream (LIDEX) 0.05 %    Sig: Apply 1 application topically 2 (two) times daily.    Dispense:  30 g    Refill:  1    May f/u here as needed.    Discharge Instructions     We have sent testing for sexually transmitted infections. We will notify you of any positive results once they are received. If required, we will prescribe any medications you might need.  Please refrain from all sexual activity for at least the next seven days.     Pending: Labs Reviewed  HIV ANTIBODY (ROUTINE TESTING W REFLEX)  RPR  CERVICOVAGINAL ANCILLARY ONLY     Reviewed expectations re: course of current medical issues. Questions answered. Outlined signs and symptoms indicating need for more acute intervention. Patient verbalized understanding. After Visit Summary given.   SUBJECTIVE:  Kimberly Gibson is a 25 y.o. female who presents with a skin complaint. Dry, cracked, irritated skin of hands; past week or two; questions related to household chemical exposure. Itching. No pain. No OTC tx.  Also requests STD screening. No symptoms.   OBJECTIVE: Vitals:   03/18/20 1120  BP: 100/66  Pulse: 80  Resp: 18  Temp: 98.3 F (36.8 C)  TempSrc: Oral  SpO2: 99%    General appearance: alert; no distress HEENT: [place]; AT Neck: supple with FROM Lungs: clear to auscultation bilaterally Heart: regular rate and rhythm Extremities: no edema; moves all extremities normally Skin: warm and dry; dry, cracked skin of both hands; no bleeding or signs of infection Psychological: alert and cooperative; normal mood and affect  No Known Allergies  Past Medical History:  Diagnosis Date  . BV (bacterial vaginosis)   . GERD (gastroesophageal reflux disease)   .  Microcytic anemia   . Normal pregnancy in multigravida, antepartum 12/11/2017  . SVD (spontaneous vaginal delivery) 12/12/2017   Social History   Socioeconomic History  . Marital status: Single    Spouse name: Not on file  . Number of children: Not on file  . Years of education: Not on file  . Highest education level: Not on file  Occupational History  . Not on file  Tobacco Use  . Smoking status: Current Some Day Smoker    Packs/day: 2.00    Types: Cigarettes, Cigars  . Smokeless tobacco: Never Used  . Tobacco comment: Black and off, on and off 2 years  Vaping Use  . Vaping Use: Never used  Substance and Sexual Activity  . Alcohol use: No  . Drug use: No  . Sexual activity: Yes    Birth control/protection: None  Other Topics Concern  . Not on file  Social History Narrative  . Not on file   Social Determinants of Health   Financial Resource Strain: Not on file  Food Insecurity: Not on file  Transportation Needs: Not on file  Physical Activity: Not on file  Stress: Not on file  Social Connections: Not on file  Intimate Partner Violence: Not on file   Family History  Problem Relation Age of Onset  . Asthma Sister   . Hearing loss Neg Hx    Past Surgical History:  Procedure Laterality Date  . CLOSED REDUCTION METACARPAL WITH PERCUTANEOUS PINNING Right 08/08/2019   Procedure:  CLOSED REDUCTION WITH PERCUTANEOUS PINNING RIGHT SMALL METACARPAL BASE FRACTURE;  Surgeon: Leanora Cover, MD;  Location: [place];  Service: Orthopedics;  Laterality: Right;  . WISDOM TOOTH EXTRACTION       Vanessa Kick, MD 03/18/20 [phone]

## 2020-03-18 NOTE — ED Triage Notes (Signed)
Pt presents with skin on hand & feet peeling with no complaints of pain; pt also presents for STD testing with no complaints of any symptoms.

## 2020-03-19 ENCOUNTER — Telehealth (HOSPITAL_COMMUNITY): Payer: Self-pay | Admitting: Emergency Medicine

## 2020-03-19 LAB — RPR: RPR Ser Ql: NONREACTIVE

## 2020-03-19 LAB — CERVICOVAGINAL ANCILLARY ONLY
Bacterial Vaginitis (gardnerella): POSITIVE — AB
Candida Glabrata: NEGATIVE
Candida Vaginitis: POSITIVE — AB
Chlamydia: NEGATIVE
Comment: NEGATIVE
Comment: NEGATIVE
Comment: NEGATIVE
Comment: NEGATIVE
Comment: NEGATIVE
Comment: NORMAL
Neisseria Gonorrhea: NEGATIVE
Trichomonas: POSITIVE — AB

## 2020-03-19 MED ORDER — METRONIDAZOLE 500 MG PO TABS: 500.0000 mg | ORAL_TABLET | Freq: Two times a day (BID) | ORAL | 0 refills | Status: DC

## 2020-03-19 MED ORDER — FLUCONAZOLE 150 MG PO TABS: 150.0000 mg | ORAL_TABLET | Freq: Once | ORAL | 0 refills | Status: AC

## 2020-05-27 ENCOUNTER — Emergency Department (HOSPITAL_COMMUNITY): Payer: Medicaid Other

## 2020-05-27 ENCOUNTER — Ambulatory Visit (HOSPITAL_COMMUNITY)
Admission: RE | Admit: 2020-05-27 | Discharge: 2020-05-27 | Disposition: A | Discharge status: Home / Self Care | Payer: Medicaid Other | Source: Ambulatory Visit | Attending: Obstetrics and Gynecology | Admitting: Obstetrics and Gynecology

## 2020-05-27 ENCOUNTER — Emergency Department (HOSPITAL_COMMUNITY)
Admission: EM | Admit: 2020-05-27 | Discharge: 2020-05-27 | Disposition: A | Discharge status: Home / Self Care | Payer: Medicaid Other | Attending: Emergency Medicine | Admitting: Emergency Medicine

## 2020-05-27 ENCOUNTER — Encounter (HOSPITAL_COMMUNITY): Payer: Self-pay

## 2020-05-27 ENCOUNTER — Other Ambulatory Visit: Payer: Self-pay

## 2020-05-27 DIAGNOSIS — R109 Unspecified abdominal pain: Secondary | ICD-10-CM | POA: Insufficient documentation

## 2020-05-27 DIAGNOSIS — D649 Anemia, unspecified: Secondary | ICD-10-CM | POA: Insufficient documentation

## 2020-05-27 DIAGNOSIS — R55 Syncope and collapse: Secondary | ICD-10-CM | POA: Diagnosis not present

## 2020-05-27 DIAGNOSIS — R0602 Shortness of breath: Secondary | ICD-10-CM | POA: Insufficient documentation

## 2020-05-27 DIAGNOSIS — F17210 Nicotine dependence, cigarettes, uncomplicated: Secondary | ICD-10-CM | POA: Insufficient documentation

## 2020-05-27 DIAGNOSIS — R519 Headache, unspecified: Secondary | ICD-10-CM | POA: Diagnosis present

## 2020-05-27 DIAGNOSIS — R251 Tremor, unspecified: Secondary | ICD-10-CM | POA: Diagnosis not present

## 2020-05-27 DIAGNOSIS — E236 Other disorders of pituitary gland: Secondary | ICD-10-CM

## 2020-05-27 DIAGNOSIS — K219 Gastro-esophageal reflux disease without esophagitis: Secondary | ICD-10-CM | POA: Insufficient documentation

## 2020-05-27 LAB — BASIC METABOLIC PANEL
Anion gap: 7 (ref 5–15)
BUN: 11 mg/dL (ref 6–20)
CO2: 21 mmol/L — ABNORMAL LOW (ref 22–32)
Calcium: 8.7 mg/dL — ABNORMAL LOW (ref 8.9–10.3)
Chloride: 108 mmol/L (ref 98–111)
Creatinine, Ser: 0.59 mg/dL (ref 0.44–1.00)
GFR, Estimated: >60 mL/min (ref >60)
Glucose, Bld: 93 mg/dL (ref 70–99)
Potassium: 4.2 mmol/L (ref 3.5–5.1)
Sodium: 136 mmol/L (ref 135–145)

## 2020-05-27 LAB — CBC
HCT: 28.7 % — ABNORMAL LOW (ref 36.0–46.0)
Hemoglobin: 7.7 g/dL — ABNORMAL LOW (ref 12.0–15.0)
MCH: 18.4 pg — ABNORMAL LOW (ref 26.0–34.0)
MCHC: 26.8 g/dL — ABNORMAL LOW (ref 30.0–36.0)
MCV: 68.5 fL — ABNORMAL LOW (ref 80.0–100.0)
Platelets: 742 10*3/uL — ABNORMAL HIGH (ref 150–400)
RBC: 4.19 MIL/uL (ref 3.87–5.11)
RDW: 21.1 % — ABNORMAL HIGH (ref 11.5–15.5)
WBC: 7.6 10*3/uL (ref 4.0–10.5)
nRBC: 0 % (ref 0.0–0.2)

## 2020-05-27 LAB — URINALYSIS, ROUTINE W REFLEX MICROSCOPIC
Bilirubin Urine: NEGATIVE
Glucose, UA: NEGATIVE mg/dL
Hgb urine dipstick: NEGATIVE
Ketones, ur: NEGATIVE mg/dL
Leukocytes,Ua: NEGATIVE
Nitrite: NEGATIVE
Protein, ur: NEGATIVE mg/dL
Specific Gravity, Urine: 1.018 (ref 1.005–1.030)
pH: 6 (ref 5.0–8.0)

## 2020-05-27 LAB — I-STAT BETA HCG BLOOD, ED (MC, WL, AP ONLY): I-stat hCG, quantitative: <5 m[IU]/mL (ref <5)

## 2020-05-27 LAB — CBG MONITORING, ED: Glucose-Capillary: 93 mg/dL (ref 70–99)

## 2020-05-27 MED ORDER — FERROUS SULFATE 325 (65 FE) MG PO TABS: 325.0000 mg | ORAL_TABLET | Freq: Two times a day (BID) | ORAL | 1 refills | Status: DC

## 2020-05-27 NOTE — ED Provider Notes (Signed)
[facility] DEPT Provider Note   CSN: [id number] Arrival date & time: 05/27/20  1015     History Chief Complaint  Patient presents with  . Loss of Consciousness  . Extremity Laceration    Kimberly Gibson is a 25 y.o. female presenting for evaluation of syncope yesterday.  Patient states yesterday she got out of the car talking to somebody when she had a syncopal event.  She does not report feeling poorly prior to the event.  Per bystander, patient times low back in her head and she had some shaking.  She woke up this and she went to the ground.  She denies injury from the fall.  She not have bowel or bladder incontinence.  She not take bite her tongue.  No history of seizures.  She reports since then, she has had a mild headache and occasionally feels like her vision is going in and out when she is about to pass out again.  She reports mild shortness of breath, worse with exertion.  No fever, chills, cough, chest pain, nausea, vomiting, urinary symptoms, normal bowel movements.  She has had intermittent abdominal pain for the past week.  She is currently on her period, it has been slightly lighter than it was close to but not as heavy.  She does report a history of anemia, is not currently on any medications including iron pills.  No other medical problems, takes medications daily.  She denies cardiac history.  She mentioned travel, surgeries, immobilization, history of cancer, history of previous DVT/PE, or hormone use.  Additional history obtained from chart review.  Patient with a history of microcytic anemia, GERD.   HPI     Past Medical History:  Diagnosis Date  . BV (bacterial vaginosis)   . GERD (gastroesophageal reflux disease)   . Microcytic anemia   . Normal pregnancy in multigravida, antepartum 12/11/2017  . SVD (spontaneous vaginal delivery) 12/12/2017    Patient Active Problem List   Diagnosis Date Noted  . Normal pregnancy in multigravida  in third trimester 12/12/2017  . SVD (spontaneous vaginal delivery) 12/12/2017  . Normal pregnancy in multigravida, antepartum 12/11/2017  . No leakage of amniotic fluid into vagina 11/27/2017  . Adjustment disorder with mixed anxiety and depressed mood 08/28/2015  . GERD (gastroesophageal reflux disease) 08/27/2015  . Overdose of iron or iron compound 08/27/2015  . Microcytic anemia 08/27/2015  . Normal pregnancy, first 09/18/2013    Past Surgical History:  Procedure Laterality Date  . CLOSED REDUCTION METACARPAL WITH PERCUTANEOUS PINNING Right 08/08/2019   Procedure: CLOSED REDUCTION WITH PERCUTANEOUS PINNING RIGHT SMALL METACARPAL BASE FRACTURE;  Surgeon: Leanora Cover, MD;  Location: [facility];  Service: Orthopedics;  Laterality: Right;  . WISDOM TOOTH EXTRACTION       OB History    Gravida  4   Para  2   Term  2   Preterm  0   AB  1   Living  2     SAB  1   IAB  0   Ectopic  0   Multiple  0   Live Births  2           Family History  Problem Relation Age of Onset  . Asthma Sister   . Hearing loss Neg Hx     Social History   Tobacco Use  . Smoking status: Current Some Day Smoker    Packs/day: 2.00    Types: Cigarettes, Cigars  . Smokeless tobacco:  Never Used  . Tobacco comment: Black and off, on and off 2 years  Vaping Use  . Vaping Use: Never used  Substance Use Topics  . Alcohol use: No  . Drug use: No    Home Medications Prior to Admission medications   Medication Sig Start Date End Date Taking? Authorizing Provider  diphenhydrAMINE (BENADRYL) 25 MG tablet Take 25 mg by mouth every 6 (six) hours as needed for allergies.   Yes [provider]  naproxen sodium (ALEVE) 220 MG tablet Take 660-880 mg by mouth 2 (two) times daily as needed (pain/headache).   Yes [provider]  ferrous sulfate 325 (65 FE) MG tablet Take 1 tablet (325 mg total) by mouth 2 (two) times daily with a meal. 05/27/20   Datra Clary, PA-C   fluocinonide cream (LIDEX) 1.69 % Apply 1 application topically 2 (two) times daily. Patient not taking: Reported on 05/27/2020 03/18/20   Vanessa Kick, MD  metroNIDAZOLE (FLAGYL) 500 MG tablet Take 1 tablet (500 mg total) by mouth 2 (two) times daily. Patient not taking: Reported on 05/27/2020 03/19/20   Chase Picket, MD    Allergies    Patient has no known allergies.  Review of Systems   Review of Systems  Gastrointestinal: Positive for abdominal pain.  Neurological: Positive for syncope and headaches.  All other systems reviewed and are negative.   Physical Exam Updated Vital Signs BP 110/79   Pulse 73   Temp 98.4 F (36.9 C) (Oral)   Resp 16   Ht 5\' 5"  (1.651 m)   Wt 54.4 kg   LMP 05/27/2020 (Approximate)   SpO2 100%   BMI 19.97 kg/m   Physical Exam Vitals and nursing note reviewed.  Constitutional:      General: She is not in acute distress.    Appearance: She is well-developed.     Comments: Resting in the bed in NAD  HENT:     Head: Normocephalic and atraumatic.     Comments: No signs of head trauma Eyes:     Extraocular Movements: Extraocular movements intact.     Conjunctiva/sclera: Conjunctivae normal.     Pupils: Pupils are equal, round, and reactive to light.  Cardiovascular:     Rate and Rhythm: Normal rate and regular rhythm.     Pulses: Normal pulses.  Pulmonary:     Effort: Pulmonary effort is normal. No respiratory distress.     Breath sounds: Normal breath sounds. No wheezing.  Abdominal:     General: There is no distension.     Palpations: Abdomen is soft. There is no mass.     Tenderness: There is no abdominal tenderness. There is no guarding or rebound.     Comments: No ttp of the abd  Musculoskeletal:        General: Normal range of motion.     Cervical back: Normal range of motion and neck supple.     Right lower leg: No edema.     Left lower leg: No edema.  Skin:    General: Skin is warm and dry.     Capillary Refill: Capillary  refill takes less than 2 seconds.  Neurological:     Mental Status: She is alert and oriented to person, place, and time.     ED Results / Procedures / Treatments   Labs (all labs ordered are listed, but only abnormal results are displayed) Labs Reviewed  BASIC METABOLIC PANEL - Abnormal; Notable for the following components:  Result Value   CO2 21 (*)    Calcium 8.7 (*)    All other components within normal limits  CBC - Abnormal; Notable for the following components:   Hemoglobin 7.7 (*)    HCT 28.7 (*)    MCV 68.5 (*)    MCH 18.4 (*)    MCHC 26.8 (*)    RDW 21.1 (*)    Platelets 742 (*)    All other components within normal limits  URINALYSIS, ROUTINE W REFLEX MICROSCOPIC  CBG MONITORING, ED  I-STAT BETA HCG BLOOD, ED ([facility], [facility], [facility] ONLY)    EKG EKG Interpretation  Date/Time:  Thursday May 27 2020 10:24:39 EDT Ventricular Rate:  96 PR Interval:  166 QRS Duration: 79 QT Interval:  351 QTC Calculation: 444 R Axis:   76 Text Interpretation: Sinus rhythm Biatrial enlargement Artifact in lead(s) I II III aVR aVL aVF since last tracing no significant change Confirmed by Daleen Bo [phone]) on 05/27/2020 1:01:58 PM   Radiology DG Chest 2 View  Result Date: 05/27/2020 CLINICAL DATA:  Shortness of breath. EXAM: CHEST - 2 VIEW COMPARISON:  December 19, 2015 FINDINGS: The heart size and mediastinal contours are within normal limits. Both lungs are clear. No visible pleural effusions or pneumothorax. No acute osseous abnormality. IMPRESSION: No active cardiopulmonary disease. Electronically Signed   By: Margaretha Sheffield MD   On: 05/27/2020 13:51   CT Head Wo Contrast  Result Date: 05/27/2020 CLINICAL DATA:  Syncope EXAM: CT HEAD WITHOUT CONTRAST TECHNIQUE: Contiguous axial images were obtained from the base of the skull through the vertex without intravenous contrast. COMPARISON:  None. FINDINGS: Brain: No evidence of acute large vascular territory infarction, hemorrhage,  hydrocephalus, or extra-axial fluid collection Although suboptimally evaluated on this noncontrast head CT, there is apparent enlargement of the pituitary gland (particularly on the coronal sequence) with possible thickening of the infundibulum. Vascular: No hyperdense vessel identified. Skull: No acute fracture. Sinuses/Orbits: Mucosal thickening of left frontal sinus and scattered ethmoid air cells and anterior sphenoid sinuses. Other: No mastoid effusions. IMPRESSION: 1. Although suboptimally evaluated on this noncontrast head CT, there is apparent enlargement of the pituitary gland with suprasellar extension (particularly on the coronal sequence) and possible thickening of the infundibulum. Recommend pituitary protocol MRI to further characterize and evaluate for a pituitary mass. 2. Otherwise, no acute intracranial abnormality. 3. Paranasal sinus mucosal thickening. Electronically Signed   By: Margaretha Sheffield MD   On: 05/27/2020 13:50    Procedures Procedures   Medications Ordered in ED Medications - No data to display  ED Course  I have reviewed the triage vital signs and the nursing notes.  Pertinent labs & imaging results that were available during my care of the patient were reviewed by me and considered in my medical decision making (see chart for details).    MDM Rules/Calculators/A&P                          Patient presented for evaluation after syncope yesterday.  On exam, patient appears nontoxic.  While patient does report that her eyes rolled back and she has been shaking, history is not consistent with seizure as patient did not have any incontinence or postictal period.  Consider orthostatic versus vasovagal.  Patient is currently on her period, consider anemia or electrolyte abnormality.  Less likely cardiac or intracranial causes, however as patient needs to feels slightly lightheaded/dizzy today, will obtain CT head.   Chest x-ray  viewed and interpreted by me, no pna, pnx,  or effusion.  CT head shows possible enlargement of pituitary gland.  Will consult with neurology to see whether patient needs ED or outpatient MRI for further evaluation.  Labs show anemia of 7.7, but per chart review this is baseline.  Most likely iron deficiency.  Will have patient restart iron tablets and follow-up closely for recheck of CBC.  Of note, she has thrombocytosis, likely secondary to being on her.  And IDA.  However this should also be rechecked when her counts are rechecked.  On reevaluation, patient remains well-appearing.  Blood pressure stable.  No orthostatic vital changes.  Discussed with Dr. Lorrin Goodell from neurology who recommends neurosurgery consulation to see if pt need MRI.   Discussed with Dr. Reatha Armour from neurosurgery who reviewed the images. Feels pt can f/u OP in the clinic, and his office will reach out to pt to set up appt. I discussed findings and plan with pt, who is agreeable. At this time, pt pears safe for d/c. Return precautions given. Pt states she understands and agrees to plan.   Final Clinical Impression(s) / ED Diagnoses Final diagnoses:  Anemia, unspecified type  Syncope, unspecified syncope type    Rx / DC Orders ED Discharge Orders         Ordered    ferrous sulfate 325 (65 FE) MG tablet  2 times daily with meals        05/27/20 [place], Carson Meche, PA-C 05/27/20 1615    Daleen Bo, MD 05/28/20 [phone]

## 2020-05-27 NOTE — ED Triage Notes (Signed)
Patient states she got out of her car yesterday and had an episode where the other person reported to her that she was shaking and her eyes rolled back into her head. Patient states this same scenario happened approx 1 year ago. Patient states today that she feels like "I'm going to go out again and I can't focus."

## 2020-05-27 NOTE — Discharge Instructions (Addendum)
Your blood counts were found to be low again today.  Start taking iron pills and follow-up closely for recheck of your blood counts. Make sure you are staying well-hydrated water. Return to the emergency room if you have repeat episodes of passing out or with any new, worsening, or concerning symptoms  Your head CT showed a slightly enlarged pituitary gland.  I have discussed this with Dr. Reatha Armour from neurosurgery who reviewed the imaging and recommends he follow-up outpatient with him.  His office should be reaching out to you to set up an appointment, however if you do not hear from them, you may call the number listed below.

## 2020-07-02 ENCOUNTER — Ambulatory Visit (HOSPITAL_COMMUNITY)
Admission: EM | Admit: 2020-07-02 | Discharge: 2020-07-02 | Disposition: A | Discharge status: Home / Self Care | Payer: Medicaid Other | Attending: Physician Assistant | Admitting: Physician Assistant

## 2020-07-02 ENCOUNTER — Encounter (HOSPITAL_COMMUNITY): Payer: Self-pay

## 2020-07-02 DIAGNOSIS — K5900 Constipation, unspecified: Secondary | ICD-10-CM

## 2020-07-02 DIAGNOSIS — Z113 Encounter for screening for infections with a predominantly sexual mode of transmission: Secondary | ICD-10-CM | POA: Diagnosis not present

## 2020-07-02 DIAGNOSIS — N898 Other specified noninflammatory disorders of vagina: Secondary | ICD-10-CM | POA: Diagnosis not present

## 2020-07-02 HISTORY — DX: Other disorders of pituitary gland: E23.6

## 2020-07-02 MED ORDER — METRONIDAZOLE 500 MG PO TABS: 500.0000 mg | ORAL_TABLET | Freq: Two times a day (BID) | ORAL | 0 refills | Status: DC

## 2020-07-02 NOTE — Discharge Instructions (Addendum)
Take metronidazole twice daily for 1 week.  You should not drink any alcohol with this medication or for 72 hours after completing it as it can cause you to vomit.  Make sure you wear loosefitting cotton underwear and hypoallergenic soaps and detergents.  We will be in touch with your results as soon as we have them if we need to arrange additional treatment.  Drink plenty of fluid and try fiber supplement to help with constipation.  If you have any worsening symptoms please let me know.

## 2020-07-02 NOTE — ED Provider Notes (Addendum)
[facility]    CSN: [id number] Arrival date & time: 07/02/20  0802      History   Chief Complaint Chief Complaint  Patient presents with  . Vaginal Discharge  . STD testing    HPI Kimberly Gibson is a 25 y.o. female.   Patient presents today with a several day history of vaginal discharge.  She describes this as a thin, copious, malodorous.  She is a history of recurrent bacterial vaginosis and states current symptoms are similar to previous episodes of this condition.  She is interested in STI testing as she recently went on vacation and had too much to drink and woke up next to her best friend's brother is unsure if she had sexual intercourse.  She denies history of STI.  She has no concern for pregnancy as LMP 06/17/2020.  She denies any additional symptoms including nausea, vomiting, fever, pelvic pain, abdominal pain, headache, dizziness.  She has not tried any over-the-counter medications for symptom management.  Denies any changes in personal hygiene products including soaps or detergents.  During ROS, patient reported some constipation.  She was encouraged to increase fiber and water in diet.  Discussed that if she has any abdominal pain, nausea, vomiting, obstipation she is to be reevaluated to which she expressed understanding.     Past Medical History:  Diagnosis Date  . BV (bacterial vaginosis)   . Enlarged pituitary gland (HCC)    per pt report  . GERD (gastroesophageal reflux disease)   . Microcytic anemia   . Normal pregnancy in multigravida, antepartum 12/11/2017  . SVD (spontaneous vaginal delivery) 12/12/2017    Patient Active Problem List   Diagnosis Date Noted  . Normal pregnancy in multigravida in third trimester 12/12/2017  . SVD (spontaneous vaginal delivery) 12/12/2017  . Normal pregnancy in multigravida, antepartum 12/11/2017  . No leakage of amniotic fluid into vagina 11/27/2017  . Adjustment disorder with mixed anxiety and depressed mood  08/28/2015  . GERD (gastroesophageal reflux disease) 08/27/2015  . Overdose of iron or iron compound 08/27/2015  . Microcytic anemia 08/27/2015  . Normal pregnancy, first 09/18/2013    Past Surgical History:  Procedure Laterality Date  . CLOSED REDUCTION METACARPAL WITH PERCUTANEOUS PINNING Right 08/08/2019   Procedure: CLOSED REDUCTION WITH PERCUTANEOUS PINNING RIGHT SMALL METACARPAL BASE FRACTURE;  Surgeon: Leanora Cover, MD;  Location: [facility];  Service: Orthopedics;  Laterality: Right;  . WISDOM TOOTH EXTRACTION      OB History    Gravida  4   Para  2   Term  2   Preterm  0   AB  1   Living  2     SAB  1   IAB  0   Ectopic  0   Multiple  0   Live Births  2            Home Medications    Prior to Admission medications   Medication Sig Start Date End Date Taking? Authorizing Provider  diphenhydrAMINE (BENADRYL) 25 MG tablet Take 25 mg by mouth every 6 (six) hours as needed for allergies.   Yes [provider]  ferrous sulfate 325 (65 FE) MG tablet Take 1 tablet (325 mg total) by mouth 2 (two) times daily with a meal. 05/27/20  Yes Caccavale, Sophia, PA-C  metroNIDAZOLE (FLAGYL) 500 MG tablet Take 1 tablet (500 mg total) by mouth 2 (two) times daily. 07/02/20  Yes Sarea Fyfe K, PA-C  fluocinonide cream (LIDEX) 0.05 %  Apply 1 application topically 2 (two) times daily. Patient not taking: Reported on 05/27/2020 03/18/20   Vanessa Kick, MD  naproxen sodium (ALEVE) 220 MG tablet Take 660-880 mg by mouth 2 (two) times daily as needed (pain/headache).    [provider]    Family History Family History  Problem Relation Age of Onset  . Asthma Sister   . Hearing loss Neg Hx     Social History Social History   Tobacco Use  . Smoking status: Current Some Day Smoker    Packs/day: 2.00    Types: Cigarettes, Cigars  . Smokeless tobacco: Never Used  . Tobacco comment: Black and off, on and off 2 years  Vaping Use  . Vaping Use: Never used   Substance Use Topics  . Alcohol use: No  . Drug use: No     Allergies   Patient has no known allergies.   Review of Systems Review of Systems  Constitutional: Negative for activity change, appetite change, fatigue and fever.  Respiratory: Negative for cough and shortness of breath.   Cardiovascular: Negative for chest pain.  Gastrointestinal: Positive for constipation. Negative for abdominal pain, diarrhea, nausea and vomiting.  Genitourinary: Positive for vaginal discharge. Negative for dysuria, flank pain, frequency, genital sores, urgency, vaginal bleeding and vaginal pain.  Neurological: Negative for dizziness, light-headedness and headaches.     Physical Exam Triage Vital Signs ED Triage Vitals  Enc Vitals Group     BP 07/02/20 0813 100/62     Pulse Rate 07/02/20 0813 77     Resp 07/02/20 0813 18     Temp 07/02/20 0813 98.1 F (36.7 C)     Temp src --      SpO2 07/02/20 0813 100 %     Weight --      Height --      Head Circumference --      Peak Flow --      Pain Score 07/02/20 0811 0     Pain Loc --      Pain Edu? --      Excl. in [place]? --    No data found.  Updated Vital Signs BP 100/62   Pulse 77   Temp 98.1 F (36.7 C)   Resp 18   LMP 06/20/2020   SpO2 100%   Visual Acuity Right Eye Distance:   Left Eye Distance:   Bilateral Distance:    Right Eye Near:   Left Eye Near:    Bilateral Near:     Physical Exam Vitals reviewed.  Constitutional:      General: She is awake. She is not in acute distress.    Appearance: Normal appearance. She is not ill-appearing.     Comments: Very pleasant female appears stated age in no acute distress  HENT:     Head: Normocephalic and atraumatic.  Cardiovascular:     Rate and Rhythm: Normal rate and regular rhythm.     Heart sounds: No murmur heard.   Pulmonary:     Effort: Pulmonary effort is normal.     Breath sounds: Normal breath sounds. No wheezing, rhonchi or rales.  Abdominal:     General: Bowel  sounds are normal.     Palpations: Abdomen is soft.     Tenderness: There is no abdominal tenderness. There is no right CVA tenderness, left CVA tenderness, guarding or rebound.     Comments: Benign abdominal exam  Genitourinary:    Comments: Exam deferred Psychiatric:  Behavior: Behavior is cooperative.      UC Treatments / Results  Labs (all labs ordered are listed, but only abnormal results are displayed) Labs Reviewed  CERVICOVAGINAL ANCILLARY ONLY    EKG   Radiology No results found.  Procedures Procedures (including critical care time)  Medications Ordered in UC Medications - No data to display  Initial Impression / Assessment and Plan / UC Course  I have reviewed the triage vital signs and the nursing notes.  Pertinent labs & imaging results that were available during my care of the patient were reviewed by me and considered in my medical decision making (see chart for details).     Will empirically treat for bacterial vaginosis given clinical presentation with metronidazole.  Patient was instructed not to drink any alcohol with this medication due to Antabuse side effects.  She was encouraged to wear loosefitting cotton underwear and use hypoallergenic soaps and detergents.  STI screening obtained today and discussed potential need to arrange additional treatment based on laboratory results.  Strict return precautions given to which patient expressed understanding.  Patient reports that constipation is mild and was encouraged to increase fiber and fluid in diet.  Discussed alarm symptoms that would warrant emergent reevaluation to which patient expressed understanding.  Final Clinical Impressions(s) / UC Diagnoses   Final diagnoses:  Vaginal discharge  Routine screening for STI (sexually transmitted infection)  Constipation, unspecified constipation type     Discharge Instructions     Take metronidazole twice daily for 1 week.  You should not drink  any alcohol with this medication or for 72 hours after completing it as it can cause you to vomit.  Make sure you wear loosefitting cotton underwear and hypoallergenic soaps and detergents.  We will be in touch with your results as soon as we have them if we need to arrange additional treatment.  Drink plenty of fluid and try fiber supplement to help with constipation.  If you have any worsening symptoms please let me know.    ED Prescriptions    Medication Sig Dispense Auth. Provider   metroNIDAZOLE (FLAGYL) 500 MG tablet Take 1 tablet (500 mg total) by mouth 2 (two) times daily. 14 tablet Jodene Polyak, Derry Skill, PA-C     PDMP not reviewed this encounter.   Terrilee Croak, PA-C 07/02/20 0831    Nyana Haren, Derry Skill, PA-C 07/02/20 [phone]

## 2020-07-02 NOTE — ED Triage Notes (Signed)
Pt reports she went out of town last week through the weekend, and woke up in a bed and doesn't remember if anything happened. Pt now wants STD testing. Pt thinks may be having BV symptoms past 2-3 days- thick discharge with no odor- but states this is only symptom.

## 2020-07-05 ENCOUNTER — Telehealth (HOSPITAL_COMMUNITY): Payer: Self-pay | Admitting: Emergency Medicine

## 2020-07-05 LAB — CERVICOVAGINAL ANCILLARY ONLY
Bacterial Vaginitis (gardnerella): POSITIVE — AB
Candida Glabrata: NEGATIVE
Candida Vaginitis: POSITIVE — AB
Chlamydia: NEGATIVE
Comment: NEGATIVE
Comment: NEGATIVE
Comment: NEGATIVE
Comment: NEGATIVE
Comment: NEGATIVE
Comment: NORMAL
Neisseria Gonorrhea: NEGATIVE
Trichomonas: NEGATIVE

## 2020-07-05 MED ORDER — FLUCONAZOLE 150 MG PO TABS: 150.0000 mg | ORAL_TABLET | Freq: Once | ORAL | 0 refills | Status: AC

## 2020-07-10 ENCOUNTER — Encounter: Payer: Self-pay | Admitting: *Deleted

## 2020-08-19 ENCOUNTER — Ambulatory Visit (HOSPITAL_COMMUNITY): Payer: Medicaid Other

## 2020-09-16 ENCOUNTER — Encounter (HOSPITAL_COMMUNITY): Payer: Self-pay | Admitting: Emergency Medicine

## 2020-09-16 ENCOUNTER — Emergency Department (HOSPITAL_COMMUNITY)
Admission: EM | Admit: 2020-09-16 | Discharge: 2020-09-16 | Disposition: A | Discharge status: Home / Self Care | Payer: Medicaid Other | Attending: Emergency Medicine | Admitting: Emergency Medicine

## 2020-09-16 ENCOUNTER — Ambulatory Visit: Payer: Self-pay

## 2020-09-16 ENCOUNTER — Other Ambulatory Visit: Payer: Self-pay

## 2020-09-16 DIAGNOSIS — Z5321 Procedure and treatment not carried out due to patient leaving prior to being seen by health care provider: Secondary | ICD-10-CM | POA: Diagnosis not present

## 2020-09-16 DIAGNOSIS — U071 COVID-19: Secondary | ICD-10-CM | POA: Insufficient documentation

## 2020-09-16 DIAGNOSIS — R509 Fever, unspecified: Secondary | ICD-10-CM | POA: Diagnosis present

## 2020-09-16 NOTE — ED Triage Notes (Signed)
Patient reports Covid positive on home test today. C/o headache, fever, and body aches. States she took aleve PTA.

## 2020-09-22 ENCOUNTER — Ambulatory Visit (HOSPITAL_COMMUNITY): Payer: Medicaid Other

## 2020-10-06 ENCOUNTER — Other Ambulatory Visit: Payer: Self-pay

## 2020-10-06 ENCOUNTER — Ambulatory Visit: Payer: Medicaid Other

## 2020-10-06 ENCOUNTER — Encounter: Payer: Self-pay | Admitting: Emergency Medicine

## 2020-10-06 ENCOUNTER — Ambulatory Visit
Admission: EM | Admit: 2020-10-06 | Discharge: 2020-10-06 | Disposition: A | Discharge status: Home / Self Care | Payer: Medicaid Other | Attending: Emergency Medicine | Admitting: Emergency Medicine

## 2020-10-06 DIAGNOSIS — Z113 Encounter for screening for infections with a predominantly sexual mode of transmission: Secondary | ICD-10-CM

## 2020-10-06 DIAGNOSIS — Z20828 Contact with and (suspected) exposure to other viral communicable diseases: Secondary | ICD-10-CM | POA: Diagnosis not present

## 2020-10-06 NOTE — ED Provider Notes (Signed)
[facility]    CSN: [id number] Arrival date & time: 10/06/20  1241      History   Chief Complaint Chief Complaint  Patient presents with   Exposure to STD    HPI Kimberly Gibson is a 25 y.o. female presenting today for STD screening.  Reports a couple weeks ago had possible exposure to HSV.  Reports that her partner reports recently being diagnosed with HSV.  Last intercourse with this person was a couple weeks ago and reports using protection.  She denies any rashes or lesions herself.  She denies any vaginal discharge or abdominal pain.  Denies abnormal bleeding.  Would like to be screened for all STDs.  Discussed utility of HSV testing without any lesions.  Recommended against antibody testing, patient wished to still pursue this.  HPI  Past Medical History:  Diagnosis Date   BV (bacterial vaginosis)    Enlarged pituitary gland (HCC)    per pt report   GERD (gastroesophageal reflux disease)    Microcytic anemia    Normal pregnancy in multigravida, antepartum 12/11/2017   SVD (spontaneous vaginal delivery) 12/12/2017    Patient Active Problem List   Diagnosis Date Noted   Normal pregnancy in multigravida in third trimester 12/12/2017   SVD (spontaneous vaginal delivery) 12/12/2017   Normal pregnancy in multigravida, antepartum 12/11/2017   No leakage of amniotic fluid into vagina 11/27/2017   Adjustment disorder with mixed anxiety and depressed mood 08/28/2015   GERD (gastroesophageal reflux disease) 08/27/2015   Overdose of iron or iron compound 08/27/2015   Microcytic anemia 08/27/2015   Normal pregnancy, first 09/18/2013    Past Surgical History:  Procedure Laterality Date   CLOSED REDUCTION METACARPAL WITH PERCUTANEOUS PINNING Right 08/08/2019   Procedure: CLOSED REDUCTION WITH PERCUTANEOUS PINNING RIGHT SMALL METACARPAL BASE FRACTURE;  Surgeon: Leanora Cover, MD;  Location: [facility];  Service: Orthopedics;  Laterality: Right;   WISDOM TOOTH EXTRACTION       OB History     Gravida  4   Para  2   Term  2   Preterm  0   AB  1   Living  2      SAB  1   IAB  0   Ectopic  0   Multiple  0   Live Births  2            Home Medications    Prior to Admission medications   Not on File    Family History Family History  Problem Relation Age of Onset   Asthma Sister    Hearing loss Neg Hx     Social History Social History   Tobacco Use   Smoking status: Some Days    Packs/day: 2.00    Types: Cigarettes, Cigars   Smokeless tobacco: Never   Tobacco comments:    Black and off, on and off 2 years  Vaping Use   Vaping Use: Never used  Substance Use Topics   Alcohol use: No   Drug use: No     Allergies   Patient has no known allergies.   Review of Systems Review of Systems  Constitutional:  Negative for fever.  Respiratory:  Negative for shortness of breath.   Cardiovascular:  Negative for chest pain.  Gastrointestinal:  Negative for abdominal pain, diarrhea, nausea and vomiting.  Genitourinary:  Negative for dysuria, flank pain, genital sores, hematuria, menstrual problem, vaginal bleeding, vaginal discharge and vaginal pain.  Musculoskeletal:  Negative for  back pain.  Skin:  Negative for rash.  Neurological:  Negative for dizziness, light-headedness and headaches.    Physical Exam Triage Vital Signs ED Triage Vitals  Enc Vitals Group     BP 10/06/20 1254 125/85     Pulse Rate 10/06/20 1254 86     Resp 10/06/20 1254 12     Temp 10/06/20 1254 98.4 F (36.9 C)     Temp Source 10/06/20 1254 Oral     SpO2 10/06/20 1254 99 %     Weight --      Height --      Head Circumference --      Peak Flow --      Pain Score 10/06/20 1252 0     Pain Loc --      Pain Edu? --      Excl. in [place]? --    No data found.  Updated Vital Signs BP 125/85 (BP Location: Left Arm)   Pulse 86   Temp 98.4 F (36.9 C) (Oral)   Resp 12   LMP 09/29/2020 (Approximate)   SpO2 99%   Breastfeeding No   Visual  Acuity Right Eye Distance:   Left Eye Distance:   Bilateral Distance:    Right Eye Near:   Left Eye Near:    Bilateral Near:     Physical Exam Vitals and nursing note reviewed.  Constitutional:      Appearance: She is well-developed.     Comments: No acute distress  HENT:     Head: Normocephalic and atraumatic.     Nose: Nose normal.  Eyes:     Conjunctiva/sclera: Conjunctivae normal.  Cardiovascular:     Rate and Rhythm: Normal rate.  Pulmonary:     Effort: Pulmonary effort is normal. No respiratory distress.  Abdominal:     General: There is no distension.  Musculoskeletal:        General: Normal range of motion.     Cervical back: Neck supple.  Skin:    General: Skin is warm and dry.  Neurological:     Mental Status: She is alert and oriented to person, place, and time.     UC Treatments / Results  Labs (all labs ordered are listed, but only abnormal results are displayed) Labs Reviewed  HIV ANTIBODY (ROUTINE TESTING W REFLEX)  RPR  HSV(HERPES SIMPLEX VRS) I + II AB-IGG  HSV(HERPES SIMPLEX VRS) I + II AB-IGM  CERVICOVAGINAL ANCILLARY ONLY    EKG   Radiology No results found.  Procedures Procedures (including critical care time)  Medications Ordered in UC Medications - No data to display  Initial Impression / Assessment and Plan / UC Course  I have reviewed the triage vital signs and the nursing notes.  Pertinent labs & imaging results that were available during my care of the patient were reviewed by me and considered in my medical decision making (see chart for details).     STD screening-swab pending to screen for GC/chlamydia/trichomonas, blood work pending for HIV, syphilis and HSV.  Discussed with patient if HSV testing positive this is of uncertain significance.  Unable to determine if patient will have an outbreak or have active infection in the future.  Advised patient to return if developing any sores or lesions.  Discussed strict return  precautions. Patient verbalized understanding and is agreeable with plan.  Final Clinical Impressions(s) / UC Diagnoses   Final diagnoses:  Screen for STD (sexually transmitted disease)  Herpes exposure  Discharge Instructions      STD screening pending-we are checking for gonorrhea, chlamydia, trichomonas, HIV, syphilis and HSV antibodies  The best screening for HSV if you ever have a lesion bump or sore to genital area to follow-up for direct swab of this area     ED Prescriptions   None    PDMP not reviewed this encounter.   Kalifa Cadden, Oliver C, PA-C 10/06/20 1420

## 2020-10-06 NOTE — ED Triage Notes (Signed)
Patient c/o STD exposure.   Patient endorses her partner was diagnosed with herpes. Patient has had intercourse with this person one time with a condom per patient statement.   Patient denies any symptoms.   Patient wants a full STD workup per patient statement.

## 2020-10-06 NOTE — Discharge Instructions (Addendum)
STD screening pending-we are checking for gonorrhea, chlamydia, trichomonas, HIV, syphilis and HSV antibodies  The best screening for HSV if you ever have a lesion bump or sore to genital area to follow-up for direct swab of this area

## 2020-10-07 LAB — CERVICOVAGINAL ANCILLARY ONLY
Bacterial Vaginitis (gardnerella): POSITIVE — AB
Candida Glabrata: NEGATIVE
Candida Vaginitis: POSITIVE — AB
Chlamydia: NEGATIVE
Comment: NEGATIVE
Comment: NEGATIVE
Comment: NEGATIVE
Comment: NEGATIVE
Comment: NEGATIVE
Comment: NORMAL
Neisseria Gonorrhea: NEGATIVE
Trichomonas: NEGATIVE

## 2020-10-08 ENCOUNTER — Telehealth (HOSPITAL_COMMUNITY): Payer: Self-pay | Admitting: Emergency Medicine

## 2020-10-08 MED ORDER — METRONIDAZOLE 500 MG PO TABS: 500.0000 mg | ORAL_TABLET | Freq: Two times a day (BID) | ORAL | 0 refills | Status: DC

## 2020-10-08 MED ORDER — FLUCONAZOLE 150 MG PO TABS: 150.0000 mg | ORAL_TABLET | Freq: Once | ORAL | 0 refills | Status: AC

## 2020-10-09 LAB — RPR: RPR Ser Ql: NONREACTIVE

## 2020-10-09 LAB — HSV(HERPES SIMPLEX VRS) I + II AB-IGM: HSVI/II Comb IgM: 1.07 Ratio — ABNORMAL HIGH (ref 0.00–0.90)

## 2020-10-09 LAB — HIV ANTIBODY (ROUTINE TESTING W REFLEX): HIV Screen 4th Generation wRfx: NONREACTIVE

## 2020-10-09 LAB — HSV(HERPES SIMPLEX VRS) I + II AB-IGG
HSV 1 Glycoprotein G Ab, IgG: <0.91 index (ref 0.00–0.90)
HSV 2 IgG, Type Spec: 8.62 index — ABNORMAL HIGH (ref 0.00–0.90)

## 2020-12-09 ENCOUNTER — Inpatient Hospital Stay (HOSPITAL_COMMUNITY)
Admission: AD | Admit: 2020-12-09 | Discharge: 2020-12-09 | Disposition: A | Discharge status: Home / Self Care | Payer: Medicaid Other | Attending: Obstetrics and Gynecology | Admitting: Obstetrics and Gynecology

## 2020-12-09 ENCOUNTER — Other Ambulatory Visit: Payer: Self-pay

## 2020-12-09 DIAGNOSIS — F17210 Nicotine dependence, cigarettes, uncomplicated: Secondary | ICD-10-CM | POA: Diagnosis not present

## 2020-12-09 DIAGNOSIS — D649 Anemia, unspecified: Secondary | ICD-10-CM

## 2020-12-09 DIAGNOSIS — Z3202 Encounter for pregnancy test, result negative: Secondary | ICD-10-CM

## 2020-12-09 DIAGNOSIS — N939 Abnormal uterine and vaginal bleeding, unspecified: Secondary | ICD-10-CM | POA: Insufficient documentation

## 2020-12-09 LAB — URINALYSIS, ROUTINE W REFLEX MICROSCOPIC
Bacteria, UA: NONE SEEN
Bilirubin Urine: NEGATIVE
Glucose, UA: NEGATIVE mg/dL
Ketones, ur: NEGATIVE mg/dL
Leukocytes,Ua: NEGATIVE
Nitrite: NEGATIVE
Protein, ur: 30 mg/dL — AB
RBC / HPF: >50 RBC/hpf — ABNORMAL HIGH (ref 0–5)
Specific Gravity, Urine: 1.026 (ref 1.005–1.030)
pH: 5 (ref 5.0–8.0)

## 2020-12-09 LAB — CBC
HCT: 27.4 % — ABNORMAL LOW (ref 36.0–46.0)
Hemoglobin: 7.6 g/dL — ABNORMAL LOW (ref 12.0–15.0)
MCH: 18.1 pg — ABNORMAL LOW (ref 26.0–34.0)
MCHC: 27.7 g/dL — ABNORMAL LOW (ref 30.0–36.0)
MCV: 65.1 fL — ABNORMAL LOW (ref 80.0–100.0)
Platelets: 295 10*3/uL (ref 150–400)
RBC: 4.21 MIL/uL (ref 3.87–5.11)
RDW: 18.6 % — ABNORMAL HIGH (ref 11.5–15.5)
WBC: 7.7 10*3/uL (ref 4.0–10.5)
nRBC: 0 % (ref 0.0–0.2)

## 2020-12-09 LAB — HCG, QUANTITATIVE, PREGNANCY: hCG, Beta Chain, Quant, S: 1 m[IU]/mL (ref <5)

## 2020-12-09 LAB — COMPREHENSIVE METABOLIC PANEL
ALT: 11 U/L (ref 0–44)
AST: 18 U/L (ref 15–41)
Albumin: 3.7 g/dL (ref 3.5–5.0)
Alkaline Phosphatase: 43 U/L (ref 38–126)
Anion gap: 7 (ref 5–15)
BUN: 6 mg/dL (ref 6–20)
CO2: 25 mmol/L (ref 22–32)
Calcium: 9.1 mg/dL (ref 8.9–10.3)
Chloride: 106 mmol/L (ref 98–111)
Creatinine, Ser: 0.62 mg/dL (ref 0.44–1.00)
GFR, Estimated: >60 mL/min (ref >60)
Glucose, Bld: 100 mg/dL — ABNORMAL HIGH (ref 70–99)
Potassium: 3.4 mmol/L — ABNORMAL LOW (ref 3.5–5.1)
Sodium: 138 mmol/L (ref 135–145)
Total Bilirubin: 0.2 mg/dL — ABNORMAL LOW (ref 0.3–1.2)
Total Protein: 7.6 g/dL (ref 6.5–8.1)

## 2020-12-09 LAB — TYPE AND SCREEN
ABO/RH(D): A POS
Antibody Screen: NEGATIVE

## 2020-12-09 LAB — POCT PREGNANCY, URINE: Preg Test, Ur: NEGATIVE

## 2020-12-09 MED ORDER — FERROUS SULFATE 325 (65 FE) MG PO TABS: 325.0000 mg | ORAL_TABLET | ORAL | 1 refills | Status: DC

## 2020-12-09 NOTE — Discharge Instructions (Signed)
Prenatal Care Providers           [facility]  [address] [phone]  [facility]   [address]  [phone]  [facility]       [address] [phone]            [facility] @ [facility]     [address] [phone]          [facility] @ [facility]   [address] [phone]  [facility]  [address] [phone]     [facility])  [address]   [phone]     [facility]  Phone: [phone]  [facility]  Phone: [phone]  [facility] Phone: [phone]  [facility] Phone: [phone]  [facility] Phone: [phone]  [facility] Phone: [phone]  [facility]  Phone: [phone]  

## 2020-12-09 NOTE — MAU Note (Signed)
Unable to urinate at this time, pt returned to lobby.

## 2020-12-09 NOTE — MAU Provider Note (Addendum)
Chief Complaint: Vaginal Bleeding, Abdominal Pain, and Possible Pregnancy   None     SUBJECTIVE HPI: Kimberly Gibson is a 25 y.o. [phone] who presents to maternity admissions for positive urine pregnancy test at home 1 week ago with onset of light bleeding and mild cramping yesterday.      Past Medical History:  Diagnosis Date   BV (bacterial vaginosis)    Enlarged pituitary gland (HCC)    per pt report   GERD (gastroesophageal reflux disease)    Microcytic anemia    Normal pregnancy in multigravida, antepartum 12/11/2017   SVD (spontaneous vaginal delivery) 12/12/2017   Past Surgical History:  Procedure Laterality Date   CLOSED REDUCTION METACARPAL WITH PERCUTANEOUS PINNING Right 08/08/2019   Procedure: CLOSED REDUCTION WITH PERCUTANEOUS PINNING RIGHT SMALL METACARPAL BASE FRACTURE;  Surgeon: Leanora Cover, MD;  Location: [facility];  Service: Orthopedics;  Laterality: Right;   WISDOM TOOTH EXTRACTION     Social History   Socioeconomic History   Marital status: Single    Spouse name: Not on file   Number of children: Not on file   Years of education: Not on file   Highest education level: Not on file  Occupational History   Not on file  Tobacco Use   Smoking status: Some Days    Packs/day: 2.00    Types: Cigarettes, Cigars   Smokeless tobacco: Never   Tobacco comments:    Black and off, on and off 2 years  Vaping Use   Vaping Use: Never used  Substance and Sexual Activity   Alcohol use: No   Drug use: No   Sexual activity: Yes    Birth control/protection: None  Other Topics Concern   Not on file  Social History Narrative   Not on file   Social Determinants of Health   Financial Resource Strain: Not on file  Food Insecurity: Not on file  Transportation Needs: Not on file  Physical Activity: Not on file  Stress: Not on file  Social Connections: Not on file  Intimate Partner Violence: Not on file   No current facility-administered medications on file prior to  encounter.   Current Outpatient Medications on File Prior to Encounter  Medication Sig Dispense Refill   metroNIDAZOLE (FLAGYL) 500 MG tablet Take 1 tablet (500 mg total) by mouth 2 (two) times daily. 14 tablet 0   No Known Allergies  ROS:  Review of Systems  Constitutional:  Negative for chills, fatigue and fever.  Respiratory:  Negative for shortness of breath.   Cardiovascular:  Negative for chest pain.  Gastrointestinal:  Positive for abdominal pain. Negative for nausea and vomiting.  Genitourinary:  Positive for pelvic pain and vaginal bleeding. Negative for difficulty urinating, dysuria, flank pain, vaginal discharge and vaginal pain.  Neurological:  Negative for dizziness and headaches.  Psychiatric/Behavioral: Negative.     I have reviewed patient's Past Medical Hx, Surgical Hx, Family Hx, Social Hx, medications and allergies.   Physical Exam  Patient Vitals for the past 24 hrs:  BP Temp Temp src Pulse Resp SpO2 Height Weight  12/09/20 1139 113/66 98.1 F (36.7 C) Oral 85 16 100 % 5\' 4"  (1.626 m) 49.3 kg   VS reviewed, nursing note reviewed,  Constitutional: well developed, well nourished, no distress HEENT: normocephalic CV: normal rate Pulm/chest wall: normal effort Abdomen: soft Neuro: alert and oriented x 3 Skin: warm, dry Psych: affect normal    MDM UPT in MAU today is negative. Pt stable, nonemergent exam today.  Pt had positive UPT at home 1-2 weeks ago so stat hcg drawn today. Pt discharged with plan to send MyChart message with hcg results and follow up plan. Rx for ferrous sulfate every other day for pt anemia.   ASSESSMENT MSE Complete 1. Urine pregnancy test negative   2. Anemia, unspecified type      PLAN Provider to contact pt via MyChart with hcg results and follow up plan  Elvera Maria, CNM 12/09/2020 2:02 PM

## 2020-12-09 NOTE — MAU Note (Signed)
2+HPT about 2 wks ago.  Has appt for OB.  Started spotting yesterday, has been off and on. Also having some intermittent cramping.

## 2020-12-09 NOTE — MAU Note (Addendum)
Had told registration that she was going to car, to get phone(over 15 min ago).  Asked them to call if/when she returns.  Blood work has been ordered. Attempted to call pt on her cell phone, rang a few times, then disconnected.

## 2021-02-26 ENCOUNTER — Telehealth: Payer: Medicaid Other | Admitting: Physician Assistant

## 2021-02-26 DIAGNOSIS — K649 Unspecified hemorrhoids: Secondary | ICD-10-CM | POA: Diagnosis not present

## 2021-02-26 DIAGNOSIS — K602 Anal fissure, unspecified: Secondary | ICD-10-CM | POA: Diagnosis not present

## 2021-02-26 MED ORDER — PRAMOXINE-HC 1-2.5 % EX CREA: TOPICAL_CREAM | Freq: Three times a day (TID) | CUTANEOUS | 0 refills | Status: DC

## 2021-02-26 NOTE — Progress Notes (Signed)
I have spent 5 minutes in review of e-visit questionnaire, review and updating patient chart, medical decision making and response to patient.   Latonya Nelon Cody Kailan Carmen, PA-C    

## 2021-02-26 NOTE — Progress Notes (Signed)

## 2021-05-20 ENCOUNTER — Telehealth: Payer: Medicaid Other | Admitting: Physician Assistant

## 2021-05-20 DIAGNOSIS — N760 Acute vaginitis: Secondary | ICD-10-CM

## 2021-05-20 MED ORDER — METRONIDAZOLE 500 MG PO TABS: 500.0000 mg | ORAL_TABLET | Freq: Two times a day (BID) | ORAL | 0 refills | Status: AC

## 2021-05-20 MED ORDER — FLUCONAZOLE 150 MG PO TABS: 150.0000 mg | ORAL_TABLET | Freq: Once | ORAL | 0 refills | Status: AC

## 2021-05-20 NOTE — Progress Notes (Signed)
E-Visit for Vaginal Symptoms ? ?We are sorry that you are not feeling well. Here is how we plan to help! ?Based on what you shared with me it looks like you: May have a vaginosis due to bacteria and a yeast infection. ? ?Vaginosis is an inflammation of the vagina that can result in discharge, itching and pain. The cause is usually a change in the normal balance of vaginal bacteria or an infection. Vaginosis can also result from reduced estrogen levels after menopause. ? ?The most common causes of vaginosis are: ? ? Bacterial vaginosis which results from an overgrowth of one on several organisms that are normally present in your vagina. ? ? Yeast infections which are caused by a naturally occurring fungus called candida. ? ? Vaginal atrophy (atrophic vaginosis) which results from the thinning of the vagina from reduced estrogen levels after menopause. ? ? Trichomoniasis which is caused by a parasite and is commonly transmitted by sexual intercourse. ? ?Factors that increase your risk of developing vaginosis include: ?Medications, such as antibiotics and steroids ?Uncontrolled diabetes ?Use of hygiene products such as bubble bath, vaginal spray or vaginal deodorant ?Douching ?Wearing damp or tight-fitting clothing ?Using an intrauterine device (IUD) for birth control ?Hormonal changes, such as those associated with pregnancy, birth control pills or menopause ?Sexual activity ?Having a sexually transmitted infection ? ?Your treatment plan is Metronidazole or Flagyl '500mg'$  twice a day for 7 days and A single Diflucan (fluconazole) '150mg'$  tablet once.  I have electronically sent this prescription into the pharmacy that you have chosen. ? ?Be sure to take all of the medication as directed. Stop taking any medication if you develop a rash, tongue swelling or shortness of breath. Mothers who are breast feeding should consider pumping and discarding their breast milk while on these antibiotics. However, there is no consensus  that infant exposure at these doses would be harmful.  ?Remember that medication creams can weaken latex condoms. ?. ? ? ?HOME CARE: ? ?Good hygiene may prevent some types of vaginosis from recurring and may relieve some symptoms: ? ?Avoid baths, hot tubs and whirlpool spas. Rinse soap from your outer genital area after a shower, and dry the area well to prevent irritation. Don't use scented or harsh soaps, such as those with deodorant or antibacterial action. ?Avoid irritants. These include scented tampons and pads. ?Wipe from front to back after using the toilet. Doing so avoids spreading fecal bacteria to your vagina. ? ?Other things that may help prevent vaginosis include: ? ?Don't douche. Your vagina doesn't require cleansing other than normal bathing. Repetitive douching disrupts the normal organisms that reside in the vagina and can actually increase your risk of vaginal infection. Douching won't clear up a vaginal infection. ?Use a latex condom. Both female and female latex condoms may help you avoid infections spread by sexual contact. ?Wear cotton underwear. Also wear pantyhose with a cotton crotch. If you feel comfortable without it, skip wearing underwear to bed. Yeast thrives in moist environments ?Your symptoms should improve in the next day or two. ? ?GET HELP RIGHT AWAY IF: ? ?You have pain in your lower abdomen ( pelvic area or over your ovaries) ?You develop nausea or vomiting ?You develop a fever ?Your discharge changes or worsens ?You have persistent pain with intercourse ?You develop shortness of breath, a rapid pulse, or you faint. ? ?These symptoms could be signs of problems or infections that need to be evaluated by a medical provider now. ? ?MAKE SURE YOU  ? ?  Understand these instructions. ?Will watch your condition. ?Will get help right away if you are not doing well or get worse. ? ?Thank you for choosing an e-visit. ? ?Your e-visit answers were reviewed by a board certified advanced  clinical practitioner to complete your personal care plan. Depending upon the condition, your plan could have included both over the counter or prescription medications. ? ?Please review your pharmacy choice. Make sure the pharmacy is open so you can pick up prescription now. If there is a problem, you may contact your provider through [employer] and have the prescription routed to another pharmacy.  Your safety is important to [place]. If you have drug allergies check your prescription carefully.  ? ?For the next 24 hours you can use MyChart to ask questions about today's visit, request a non-urgent call back, or ask for a work or school excuse. ?You will get an email in the next two days asking about your experience. I hope that your e-visit has been valuable and will speed your recovery. ? ?Approximately 5 minutes was spent documenting and reviewing patient's chart. ? ?

## 2021-08-12 ENCOUNTER — Ambulatory Visit: Payer: Medicaid Other | Admitting: Nurse Practitioner

## 2021-08-18 ENCOUNTER — Encounter (HOSPITAL_COMMUNITY): Payer: Self-pay

## 2021-08-18 ENCOUNTER — Encounter: Payer: Self-pay | Admitting: Gastroenterology

## 2021-08-18 ENCOUNTER — Telehealth (HOSPITAL_COMMUNITY): Payer: Self-pay

## 2021-08-18 ENCOUNTER — Ambulatory Visit (HOSPITAL_COMMUNITY)
Admission: RE | Admit: 2021-08-18 | Discharge: 2021-08-18 | Disposition: A | Discharge status: Home / Self Care | Payer: Medicaid Other | Source: Ambulatory Visit | Attending: Physician Assistant | Admitting: Physician Assistant

## 2021-08-18 VITALS — BP 116/53 | HR 89 | Temp 98.6°F | Resp 16

## 2021-08-18 DIAGNOSIS — R6881 Early satiety: Secondary | ICD-10-CM | POA: Diagnosis present

## 2021-08-18 DIAGNOSIS — R634 Abnormal weight loss: Secondary | ICD-10-CM

## 2021-08-18 LAB — COMPREHENSIVE METABOLIC PANEL
ALT: 11 U/L (ref 0–44)
AST: 16 U/L (ref 15–41)
Albumin: 4.1 g/dL (ref 3.5–5.0)
Alkaline Phosphatase: 40 U/L (ref 38–126)
Anion gap: 9 (ref 5–15)
BUN: 8 mg/dL (ref 6–20)
CO2: 22 mmol/L (ref 22–32)
Calcium: 9.2 mg/dL (ref 8.9–10.3)
Chloride: 105 mmol/L (ref 98–111)
Creatinine, Ser: 0.47 mg/dL (ref 0.44–1.00)
GFR, Estimated: >60 mL/min (ref >60)
Glucose, Bld: 89 mg/dL (ref 70–99)
Potassium: 3.4 mmol/L — ABNORMAL LOW (ref 3.5–5.1)
Sodium: 136 mmol/L (ref 135–145)
Total Bilirubin: 0.5 mg/dL (ref 0.3–1.2)
Total Protein: 7.8 g/dL (ref 6.5–8.1)

## 2021-08-18 LAB — POCT URINALYSIS DIPSTICK, ED / UC
Glucose, UA: NEGATIVE mg/dL
Hgb urine dipstick: NEGATIVE
Ketones, ur: NEGATIVE mg/dL
Leukocytes,Ua: NEGATIVE
Nitrite: NEGATIVE
Protein, ur: 30 mg/dL — AB
Specific Gravity, Urine: 1.02 (ref 1.005–1.030)
Urobilinogen, UA: 1 mg/dL (ref 0.0–1.0)
pH: 7 (ref 5.0–8.0)

## 2021-08-18 LAB — CBC WITH DIFFERENTIAL/PLATELET
Abs Immature Granulocytes: 0.01 10*3/uL (ref 0.00–0.07)
Basophils Absolute: 0 10*3/uL (ref 0.0–0.1)
Basophils Relative: 1 %
Eosinophils Absolute: 0.1 10*3/uL (ref 0.0–0.5)
Eosinophils Relative: 3 %
HCT: 25.1 % — ABNORMAL LOW (ref 36.0–46.0)
Hemoglobin: 6.6 g/dL — CL (ref 12.0–15.0)
Immature Granulocytes: 0 %
Lymphocytes Relative: 38 %
Lymphs Abs: 2 10*3/uL (ref 0.7–4.0)
MCH: 16.6 pg — ABNORMAL LOW (ref 26.0–34.0)
MCHC: 26.3 g/dL — ABNORMAL LOW (ref 30.0–36.0)
MCV: 63.1 fL — ABNORMAL LOW (ref 80.0–100.0)
Monocytes Absolute: 0.4 10*3/uL (ref 0.1–1.0)
Monocytes Relative: 7 %
Neutro Abs: 2.7 10*3/uL (ref 1.7–7.7)
Neutrophils Relative %: 51 %
Platelets: 298 10*3/uL (ref 150–400)
RBC: 3.98 MIL/uL (ref 3.87–5.11)
RDW: 19.7 % — ABNORMAL HIGH (ref 11.5–15.5)
WBC: 5.3 10*3/uL (ref 4.0–10.5)
nRBC: 0 % (ref 0.0–0.2)

## 2021-08-18 LAB — POC URINE PREG, ED: Preg Test, Ur: NEGATIVE

## 2021-08-18 LAB — TSH: TSH: 0.928 u[IU]/mL (ref 0.350–4.500)

## 2021-08-18 LAB — T4, FREE: Free T4: 0.7 ng/dL (ref 0.61–1.12)

## 2021-08-18 MED ORDER — ALUM & MAG HYDROXIDE-SIMETH 200-200-20 MG/5ML PO SUSP
ORAL | Status: AC
  Filled 2021-08-18: qty 30

## 2021-08-18 MED ORDER — ALUM & MAG HYDROXIDE-SIMETH 200-200-20 MG/5ML PO SUSP
30.0000 mL | Freq: Once | ORAL | Status: AC
  Administered 2021-08-18: 30 mL via ORAL

## 2021-08-18 MED ORDER — SUCRALFATE 1 GM/10ML PO SUSP: 1.0000 g | Freq: Three times a day (TID) | ORAL | 0 refills | Status: DC

## 2021-08-18 MED ORDER — FAMOTIDINE 40 MG/5ML PO SUSR: 40.0000 mg | Freq: Every day | ORAL | 1 refills | Status: DC

## 2021-08-18 MED ORDER — LIDOCAINE VISCOUS HCL 2 % MT SOLN
OROMUCOSAL | Status: AC
  Filled 2021-08-18: qty 15

## 2021-08-18 MED ORDER — LIDOCAINE VISCOUS HCL 2 % MT SOLN
15.0000 mL | Freq: Once | OROMUCOSAL | Status: AC
  Administered 2021-08-18: 15 mL via ORAL

## 2021-08-18 NOTE — ED Provider Notes (Signed)
[place]    CSN: [id number] Arrival date & time: 08/18/21  1133      History   Chief Complaint Chief Complaint  Patient presents with   Abdominal Pain    I been having a issue eating and everyday it's getting worse I lost a lot of weight to the point it's bad I can only eat one bite of food and be full .. - Entered by patient    HPI Kimberly Gibson is a 26 y.o. female.   Patient presents today with a year-long history of decreased appetite and early satiety.  She reports that even when she tries to make herself eat she will feel full after just 1 bite and then have nausea with occasional vomiting.  She reports that symptoms began after a very stressful time when her children's father took the kids from her.  She then developed some depression and anxiety and believes this initiated her symptoms.  She is working with a Social worker and has found that this is better but continues to have severe abdominal discomfort whenever she tries to eat which has resulted in weight loss.  She has not seen a GI specialist in the past and denies history of peptic ulcer disease.  She denies any melena, hematochezia, dysphagia.  Does report occasional esophageal burning and GI symptoms.  She has not tried medication for symptom management.  Denies history of H. pylori.    Past Medical History:  Diagnosis Date   BV (bacterial vaginosis)    Enlarged pituitary gland (HCC)    per pt report   GERD (gastroesophageal reflux disease)    Microcytic anemia    Normal pregnancy in multigravida, antepartum 12/11/2017   SVD (spontaneous vaginal delivery) 12/12/2017    Patient Active Problem List   Diagnosis Date Noted   Normal pregnancy in multigravida in third trimester 12/12/2017   SVD (spontaneous vaginal delivery) 12/12/2017   Normal pregnancy in multigravida, antepartum 12/11/2017   No leakage of amniotic fluid into vagina 11/27/2017   Adjustment disorder with mixed anxiety and depressed mood  08/28/2015   GERD (gastroesophageal reflux disease) 08/27/2015   Overdose of iron or iron compound 08/27/2015   Microcytic anemia 08/27/2015   Normal pregnancy, first 09/18/2013    Past Surgical History:  Procedure Laterality Date   CLOSED REDUCTION METACARPAL WITH PERCUTANEOUS PINNING Right 08/08/2019   Procedure: CLOSED REDUCTION WITH PERCUTANEOUS PINNING RIGHT SMALL METACARPAL BASE FRACTURE;  Surgeon: Leanora Cover, MD;  Location: [facility];  Service: Orthopedics;  Laterality: Right;   WISDOM TOOTH EXTRACTION      OB History     Gravida  4   Para  2   Term  2   Preterm  0   AB  1   Living  2      SAB  1   IAB  0   Ectopic  0   Multiple  0   Live Births  2            Home Medications    Prior to Admission medications   Medication Sig Start Date End Date Taking? Authorizing Provider  famotidine (PEPCID) 40 MG/5ML suspension Take 5 mLs (40 mg total) by mouth at bedtime. 08/18/21  Yes Fani Rotondo K, PA-C  sucralfate (CARAFATE) 1 GM/10ML suspension Take 10 mLs (1 g total) by mouth 4 (four) times daily -  with meals and at bedtime. 08/18/21  Yes Cherrise Occhipinti, Derry Skill, PA-C    Family History Family History  Problem Relation Age of Onset   Asthma Sister    Hearing loss Neg Hx     Social History Social History   Tobacco Use   Smoking status: Some Days    Packs/day: 2.00    Types: Cigarettes, Cigars   Smokeless tobacco: Never   Tobacco comments:    Black and off, on and off 2 years  Vaping Use   Vaping Use: Never used  Substance Use Topics   Alcohol use: No   Drug use: No     Allergies   Patient has no known allergies.   Review of Systems Review of Systems  Constitutional:  Positive for activity change and unexpected weight change. Negative for appetite change, fatigue and fever.  Respiratory:  Negative for cough and shortness of breath.   Cardiovascular:  Negative for chest pain.  Gastrointestinal:  Positive for abdominal pain. Negative for blood in  stool, constipation, diarrhea, nausea and vomiting.  Neurological:  Negative for dizziness, light-headedness and headaches.     Physical Exam Triage Vital Signs ED Triage Vitals  Enc Vitals Group     BP 08/18/21 1157 (!) 116/53     Pulse Rate 08/18/21 1157 89     Resp 08/18/21 1157 16     Temp 08/18/21 1157 98.6 F (37 C)     Temp Source 08/18/21 1157 Oral     SpO2 08/18/21 1157 99 %     Weight --      Height --      Head Circumference --      Peak Flow --      Pain Score 08/18/21 1158 0     Pain Loc --      Pain Edu? --      Excl. in [place]? --    No data found.  Updated Vital Signs BP (!) 116/53 (BP Location: Left Arm)   Pulse 89   Temp 98.6 F (37 C) (Oral)   Resp 16   LMP 07/24/2021   SpO2 99%   Visual Acuity Right Eye Distance:   Left Eye Distance:   Bilateral Distance:    Right Eye Near:   Left Eye Near:    Bilateral Near:     Physical Exam Vitals reviewed.  Constitutional:      General: She is awake. She is not in acute distress.    Appearance: Normal appearance. She is well-developed and underweight. She is not ill-appearing.     Comments: Very pleasant female appears stated age in no acute distress sitting comfortably in exam room  HENT:     Head: Normocephalic and atraumatic.  Cardiovascular:     Rate and Rhythm: Normal rate and regular rhythm.     Heart sounds: Normal heart sounds, S1 normal and S2 normal. No murmur heard. Pulmonary:     Effort: Pulmonary effort is normal.     Breath sounds: Normal breath sounds. No wheezing, rhonchi or rales.     Comments: Clear to auscultation bilaterally Abdominal:     General: Bowel sounds are normal.     Palpations: Abdomen is soft.     Tenderness: There is no abdominal tenderness. There is no right CVA tenderness, left CVA tenderness, guarding or rebound.     Comments: Benign abdominal exam  Psychiatric:        Mood and Affect: Affect is tearful.        Speech: Speech normal.        Behavior: Behavior  is cooperative.  UC Treatments / Results  Labs (all labs ordered are listed, but only abnormal results are displayed) Labs Reviewed  POCT URINALYSIS DIPSTICK, ED / UC - Abnormal; Notable for the following components:      Result Value   Bilirubin Urine SMALL (*)    Protein, ur 30 (*)    All other components within normal limits  CBC WITH DIFFERENTIAL/PLATELET  COMPREHENSIVE METABOLIC PANEL  TSH  T4, FREE  POC URINE PREG, ED    EKG   Radiology No results found.  Procedures Procedures (including critical care time)  Medications Ordered in UC Medications  alum & mag hydroxide-simeth (MAALOX/MYLANTA) 200-200-20 MG/5ML suspension 30 mL (30 mLs Oral Given 08/18/21 1223)    And  lidocaine (XYLOCAINE) 2 % viscous mouth solution 15 mL (15 mLs Oral Given 08/18/21 1223)    Initial Impression / Assessment and Plan / UC Course  I have reviewed the triage vital signs and the nursing notes.  Pertinent labs & imaging results that were available during my care of the patient were reviewed by me and considered in my medical decision making (see chart for details).     UA was normal.  Urine pregnancy is negative.  Patient was given GI cocktail with significant improvement of symptoms and was able to eat a few crackers and chips.  Concern for gastritis versus peptic ulcer disease.  We will start Carafate and Pepcid.  Recommended a bland diet and drinking plenty of fluid.  Discussed the importance of following up with a GI specialist as she likely needs an endoscopy for further investigation of symptoms was given contact information for local provider with instruction to call to schedule an appointment.  Discussed that if she has any worsening symptoms including severe abdominal pain, fever, nausea, vomiting, melena, hematochezia, hematemesis she needs to go to the emergency room to which she expressed understanding.  We will try to establish patient with PCP to consider SSRI therapy if  appropriate.  She was encouraged to follow-up with her therapist as scheduled.  Patient contract for safety today and if she has any worsening symptoms she will return for reevaluation.  Final Clinical Impressions(s) / UC Diagnoses   Final diagnoses:  Early satiety  Unintentional weight loss     Discharge Instructions      I believe that you have inflammation of your stomach lining causing your symptoms.  Start Pepcid and Carafate.  Eat a bland diet and avoid spicy/acidic/fatty foods.  You should avoid NSAIDs including aspirin, ibuprofen/Advil, naproxen/Aleve.  You can use Tylenol.  It is very importantly follow-up with GI.  Please call to schedule an appointment.  If you have any worsening symptoms including severe abdominal pain, fever, nausea, vomiting, blood in your stool, blood in your vomit you need to go to the emergency room immediately.     ED Prescriptions     Medication Sig Dispense Auth. Provider   famotidine (PEPCID) 40 MG/5ML suspension Take 5 mLs (40 mg total) by mouth at bedtime. 150 mL Suda Forbess K, PA-C   sucralfate (CARAFATE) 1 GM/10ML suspension Take 10 mLs (1 g total) by mouth 4 (four) times daily -  with meals and at bedtime. 420 mL Richad Ramsay K, PA-C      PDMP not reviewed this encounter.   Terrilee Croak, PA-C 08/18/21 1302

## 2021-08-18 NOTE — Telephone Encounter (Signed)
Received call from [facility] regarding critical lab for patient. Hemoglobin at 6.6. Per Dr.Halger patient is being followed by her PCP and has upcoming appointment. Pt advised to notify her PCP/ Pt voiced understanding.

## 2021-08-18 NOTE — Discharge Instructions (Signed)
I believe that you have inflammation of your stomach lining causing your symptoms.  Start Pepcid and Carafate.  Eat a bland diet and avoid spicy/acidic/fatty foods.  You should avoid NSAIDs including aspirin, ibuprofen/Advil, naproxen/Aleve.  You can use Tylenol.  It is very importantly follow-up with GI.  Please call to schedule an appointment.  If you have any worsening symptoms including severe abdominal pain, fever, nausea, vomiting, blood in your stool, blood in your vomit you need to go to the emergency room immediately.

## 2021-08-18 NOTE — ED Triage Notes (Signed)
Pt states she has no appetite since 07/2019 states if she eats a bite of food she  feels like vomiting.

## 2021-08-23 ENCOUNTER — Encounter: Payer: Self-pay | Admitting: Gastroenterology

## 2021-08-23 ENCOUNTER — Other Ambulatory Visit: Payer: Self-pay

## 2021-08-23 ENCOUNTER — Encounter (HOSPITAL_COMMUNITY): Payer: Self-pay

## 2021-08-23 ENCOUNTER — Other Ambulatory Visit (INDEPENDENT_AMBULATORY_CARE_PROVIDER_SITE_OTHER): Payer: Medicaid Other

## 2021-08-23 ENCOUNTER — Ambulatory Visit (INDEPENDENT_AMBULATORY_CARE_PROVIDER_SITE_OTHER): Payer: Medicaid Other | Admitting: Gastroenterology

## 2021-08-23 ENCOUNTER — Emergency Department (HOSPITAL_COMMUNITY)
Admission: EM | Admit: 2021-08-23 | Discharge: 2021-08-23 | Disposition: A | Discharge status: Home / Self Care | Payer: Medicaid Other | Attending: Emergency Medicine | Admitting: Emergency Medicine

## 2021-08-23 VITALS — BP 118/66 | HR 103 | Ht 65.0 in | Wt 102.0 lb

## 2021-08-23 DIAGNOSIS — R6881 Early satiety: Secondary | ICD-10-CM | POA: Diagnosis not present

## 2021-08-23 DIAGNOSIS — R634 Abnormal weight loss: Secondary | ICD-10-CM | POA: Diagnosis not present

## 2021-08-23 DIAGNOSIS — D509 Iron deficiency anemia, unspecified: Secondary | ICD-10-CM

## 2021-08-23 DIAGNOSIS — D500 Iron deficiency anemia secondary to blood loss (chronic): Secondary | ICD-10-CM | POA: Diagnosis not present

## 2021-08-23 DIAGNOSIS — D649 Anemia, unspecified: Secondary | ICD-10-CM | POA: Diagnosis present

## 2021-08-23 LAB — CBC WITH DIFFERENTIAL/PLATELET
Basophils Absolute: 0 10*3/uL (ref 0.0–0.1)
Basophils Relative: 0.8 % (ref 0.0–3.0)
Eosinophils Absolute: 0.1 10*3/uL (ref 0.0–0.7)
Eosinophils Relative: 2.1 % (ref 0.0–5.0)
HCT: 25 % — ABNORMAL LOW (ref 36.0–46.0)
Hemoglobin: 6.9 g/dL — CL (ref 12.0–15.0)
Lymphocytes Relative: 36 % (ref 12.0–46.0)
Lymphs Abs: 2.3 10*3/uL (ref 0.7–4.0)
MCHC: 27.6 g/dL — ABNORMAL LOW (ref 30.0–36.0)
MCV: 60.1 fl — ABNORMAL LOW (ref 78.0–100.0)
Monocytes Absolute: 0.5 10*3/uL (ref 0.1–1.0)
Monocytes Relative: 7.7 % (ref 3.0–12.0)
Neutro Abs: 3.4 10*3/uL (ref 1.4–7.7)
Neutrophils Relative %: 53.4 % (ref 43.0–77.0)
Platelets: 189 10*3/uL (ref 150.0–400.0)
RBC: 4.16 Mil/uL (ref 3.87–5.11)
RDW: 20.2 % — ABNORMAL HIGH (ref 11.5–15.5)
WBC: 6.4 10*3/uL (ref 4.0–10.5)

## 2021-08-23 LAB — IBC + FERRITIN
Ferritin: 1.6 ng/mL — ABNORMAL LOW (ref 10.0–291.0)
Iron: 8 ug/dL — ABNORMAL LOW (ref 42–145)
Saturation Ratios: 1.4 % — ABNORMAL LOW (ref 20.0–50.0)
TIBC: 554.4 ug/dL — ABNORMAL HIGH (ref 250.0–450.0)
Transferrin: 396 mg/dL — ABNORMAL HIGH (ref 212.0–360.0)

## 2021-08-23 LAB — FOLATE: Folate: 14.4 ng/mL (ref >5.9)

## 2021-08-23 LAB — VITAMIN B12: Vitamin B-12: 420 pg/mL (ref 211–911)

## 2021-08-23 NOTE — ED Provider Triage Note (Cosign Needed)
Emergency Medicine Provider Triage Evaluation Note  Kimberly Gibson , a 26 y.o. female  was evaluated in triage.  Pt complains of abnormal labs.  Hx of anemia. Currently having vaginal bleeding.   States she has had to have a blood transfusion.   Her only sx rn is fatigue. No CP, SOB, LH or dizziness.   Review of Systems  Positive: Anemia  Negative: Fever   Physical Exam  BP 106/70 (BP Location: Left Arm)   Pulse 94   Temp 98.9 F (37.2 C) (Oral)   Resp 18   LMP 07/24/2021   SpO2 100%  Gen:   Awake, no distress   Resp:  Normal effort  MSK:   Moves extremities without difficulty  Other:    Medical Decision Making  Medically screening exam initiated at 6:16 PM.  Appropriate orders placed.  LUNAFREYA REDEKER was informed that the remainder of the evaluation will be completed by another provider, this initial triage assessment does not replace that evaluation, and the importance of remaining in the ED until their evaluation is complete.  Labs ordered. Pt VS WNLs    Gailen Shelter, [place] 08/23/21 1819

## 2021-08-23 NOTE — Progress Notes (Signed)
HPI : Kimberly Gibson is a very pleasant 27 year old female with a history of chronic iron deficiency anemia who is referred to [place] by Dr. Janyth Contes for further evaluation of unintentional weight loss.  The patient states that she normally weighs between 130 and 135 pounds.  She currently weighs 102 pounds.  She started losing weight in 2021.  This was during a period of extreme stress for the patient related to a custody battle over her children with her husband.  That stressful period has resolved, but the patient still continues to lose weight.  She does admit that stress and anxiety are still a problem for her, and she states that she follows closely with a therapist.  The patient reports that she is unable to eat very much food at all.  She gets very full after a few bites of food.  She does get nauseated frequently as well after she eats, but she only rarely vomits.  She denies having significant abdominal pain either after eating or on an empty stomach. She also reports difficulty swallowing both food and pills.  She feels like food sometimes just will not go down and she sometimes has discomfort is food travels down her esophagus.  She does have a longstanding history of GERD symptoms dating back to when she was a child.  She reports ongoing frequent symptoms of heartburn and acid reflux.  She does not take any medications for this. She is having normal bowel movements currently, with formed brown soft stools.  She had problems with constipation recently, as well as symptomatic hemorrhoids.  She had been seeing bright red blood on the toilet paper for a while in the setting of hard stools and straining.  Both these problems have been resolved for some time now.  Diarrhea is not a problem for her.  The patient is noted to have severe microcytic anemia.  The patient is aware that she has had low hemoglobin in the past.  She has been prescribed oral iron in the past, but does not tolerate them.   She says she has also received IV iron infusions. She went to urgent care last week and was found to have a hemoglobin of 6.6.  She was apparently not advised to go to the emergency department for a blood transfusion.  Her baseline hemoglobin in the past 2 years seems to be between 7 and 8. The patient denies any history of overt GI bleeding, other than the scant bright red blood per rectum she was seeing when she was having hemorrhoids.  She denies any melanic stool. She does have very heavy periods which started after her pregnancy in 2019 (hemoglobin 12 in April 2019, 9.5 in October 2019, and has not been within normal range since)..  This time point also correlates with when her anemia first became a problem.  She reports that during her menstrual periods, she will bleed heavily for 5 days, typically going through 2-1/2 boxes of tampons during that time.  The patient reports chronic fatigue which she attributes to being a single parent, going to school and working full-time, but otherwise denies any symptoms of anemia or acute blood loss such as lightheadedness/dizziness, shortness of breath or weakness.   Past Medical History:  Diagnosis Date   BV (bacterial vaginosis)    Enlarged pituitary gland (HCC)    per pt report   GERD (gastroesophageal reflux disease)    Microcytic anemia    Normal pregnancy in multigravida, antepartum 12/11/2017  SVD (spontaneous vaginal delivery) 12/12/2017     Past Surgical History:  Procedure Laterality Date   CLOSED REDUCTION METACARPAL WITH PERCUTANEOUS PINNING Right 08/08/2019   Procedure: CLOSED REDUCTION WITH PERCUTANEOUS PINNING RIGHT SMALL METACARPAL BASE FRACTURE;  Surgeon: Leanora Cover, MD;  Location: [facility];  Service: Orthopedics;  Laterality: Right;   WISDOM TOOTH EXTRACTION     Family History  Problem Relation Age of Onset   Asthma Sister    Hearing loss Neg Hx    Colon cancer Neg Hx    Esophageal cancer Neg Hx    Stomach cancer Neg Hx     Social History   Tobacco Use   Smoking status: Some Days    Packs/day: 2.00    Types: Cigarettes, Cigars   Smokeless tobacco: Never   Tobacco comments:    Black and off, on and off 2 years  Vaping Use   Vaping Use: Never used  Substance Use Topics   Alcohol use: No   Drug use: No   Current Outpatient Medications  Medication Sig Dispense Refill   famotidine (PEPCID) 40 MG/5ML suspension Take 5 mLs (40 mg total) by mouth at bedtime. 150 mL 1   sucralfate (CARAFATE) 1 GM/10ML suspension Take 10 mLs (1 g total) by mouth 4 (four) times daily -  with meals and at bedtime. 420 mL 0   No current facility-administered medications for this visit.   No Known Allergies   Review of Systems: All systems reviewed and negative except where noted in HPI.    No results found.  Physical Exam: BP 118/66   Pulse (!) 103   Ht '5\' 5"'$  (1.651 m)   Wt 102 lb (46.3 kg)   LMP 07/24/2021   SpO2 99%   BMI 16.97 kg/m  Constitutional: Pleasant,well-developed but very thin, African American female in no acute distress. HEENT: Normocephalic and atraumatic. Conjunctivae are normal. No scleral icterus. Neck supple.  Cardiovascular: Normal rate, regular rhythm.  Pulmonary/chest: Effort normal and breath sounds normal. No wheezing, rales or rhonchi. Abdominal: Soft, nondistended, nontender.  Scaphoid abdomen.  Bowel sounds active throughout. There are no masses palpable. No hepatomegaly.  Palpable abdominal aorta Extremities: no edema Lymphadenopathy: No cervical adenopathy noted. Neurological: Alert and oriented to person place and time. Skin: Skin is warm and dry. No rashes noted. Psychiatric: Normal mood and affect. Behavior is normal.  CBC    Component Value Date/Time   WBC 5.3 08/18/2021 1210   RBC 3.98 08/18/2021 1210   HGB 6.6 (LL) 08/18/2021 1210   HCT 25.1 (L) 08/18/2021 1210   PLT 298 08/18/2021 1210   MCV 63.1 (L) 08/18/2021 1210   MCH 16.6 (L) 08/18/2021 1210   MCHC 26.3 (L)  08/18/2021 1210   RDW 19.7 (H) 08/18/2021 1210   LYMPHSABS 2.0 08/18/2021 1210   MONOABS 0.4 08/18/2021 1210   EOSABS 0.1 08/18/2021 1210   BASOSABS 0.0 08/18/2021 1210    CMP     Component Value Date/Time   NA 136 08/18/2021 1210   K 3.4 (L) 08/18/2021 1210   CL 105 08/18/2021 1210   CO2 22 08/18/2021 1210   GLUCOSE 89 08/18/2021 1210   BUN 8 08/18/2021 1210   CREATININE 0.47 08/18/2021 1210   CALCIUM 9.2 08/18/2021 1210   PROT 7.8 08/18/2021 1210   ALBUMIN 4.1 08/18/2021 1210   AST 16 08/18/2021 1210   ALT 11 08/18/2021 1210   ALKPHOS 40 08/18/2021 1210   BILITOT 0.5 08/18/2021 1210   GFRNONAA >60 08/18/2021 1210  GFRAA >60 07/28/2019 1551     ASSESSMENT AND PLAN: 26 year old female with over 30 pounds unintentional weight loss over 2 to 3 years with early satiety, dysphagia and decreased appetite, as well as severe iron deficiency anemia, which is most likely secondary to heavy menstrual loss, although she may have other sources of iron deficiency in addition to menstrual blood loss.  Although it is possible that all of her weight loss is secondary to stress related changes in her appetite, further evaluation is indicated.  I recommended we proceed with an upper endoscopy to evaluate her dysphagia and early satiety and rule out any stenoses that may be contributing to her symptoms, and also evaluate for sources of chronic GI blood loss/iron malabsorption to include celiac disease and H. pylori.  We will also obtain CT of abdomen and pelvis given her extensive weight loss and early satiety to rule out an intra-abdominal mass. We will repeat CBC today.  If hemoglobin still less than 7, I recommend patient go to the emergency department for blood transfusion.  Per discussion with our head CRNA, patient would not be eligible for a procedure in our endoscopy suite with a hemoglobin that low.  We will also check iron panel and B12/folate (I was not able to find one in the EMR).  If  consistent with iron deficiency, I would recommend the patient undergo IV iron infusions given her intolerance of oral iron supplements.  Weight loss, early satiety - EGD - CT abdomen pelvis  Microcytic anemia - Repeat CBC - If hemoglobin less than 7, recommend patient go to the emergency department for blood transfusion - Iron panel, B12/folate - If iron deficient, recommend IV iron infusions  The details, risks (including bleeding, perforation, infection, missed lesions, medication reactions and possible hospitalization or surgery if complications occur), benefits, and alternatives to EGD with possible biopsy and possible dilation were discussed with the patient and she consents to proceed.    Drema Eddington E. Candis Schatz, MD [facility]  CC:  Bovard-Stuckert, Jody, *

## 2021-08-23 NOTE — ED Triage Notes (Signed)
Pt reports being sent here for blood transfusion due to hemoglobin of 6.9. Pt reports she has an endoscopy tomorrow and they requested that she have a blood transfusion prior to that. Pt denies any symptoms.

## 2021-08-24 ENCOUNTER — Encounter: Payer: Medicaid Other | Admitting: Gastroenterology

## 2021-08-24 ENCOUNTER — Inpatient Hospital Stay (HOSPITAL_COMMUNITY)
Admission: EM | Admit: 2021-08-24 | Discharge: 2021-08-26 | DRG: 812 | Disposition: A | Discharge status: Home / Self Care | Payer: Medicaid Other | Source: Ambulatory Visit | Attending: Internal Medicine | Admitting: Internal Medicine

## 2021-08-24 ENCOUNTER — Encounter (HOSPITAL_COMMUNITY): Payer: Self-pay

## 2021-08-24 ENCOUNTER — Telehealth: Payer: Self-pay | Admitting: Gastroenterology

## 2021-08-24 DIAGNOSIS — K219 Gastro-esophageal reflux disease without esophagitis: Secondary | ICD-10-CM | POA: Diagnosis present

## 2021-08-24 DIAGNOSIS — D62 Acute posthemorrhagic anemia: Principal | ICD-10-CM | POA: Diagnosis present

## 2021-08-24 DIAGNOSIS — K2100 Gastro-esophageal reflux disease with esophagitis, without bleeding: Secondary | ICD-10-CM | POA: Diagnosis present

## 2021-08-24 DIAGNOSIS — K644 Residual hemorrhoidal skin tags: Secondary | ICD-10-CM | POA: Diagnosis present

## 2021-08-24 DIAGNOSIS — R1319 Other dysphagia: Secondary | ICD-10-CM

## 2021-08-24 DIAGNOSIS — F419 Anxiety disorder, unspecified: Secondary | ICD-10-CM | POA: Diagnosis present

## 2021-08-24 DIAGNOSIS — Z825 Family history of asthma and other chronic lower respiratory diseases: Secondary | ICD-10-CM

## 2021-08-24 DIAGNOSIS — K5900 Constipation, unspecified: Secondary | ICD-10-CM | POA: Diagnosis present

## 2021-08-24 DIAGNOSIS — Z681 Body mass index (BMI) 19 or less, adult: Secondary | ICD-10-CM

## 2021-08-24 DIAGNOSIS — R634 Abnormal weight loss: Secondary | ICD-10-CM | POA: Diagnosis present

## 2021-08-24 DIAGNOSIS — K648 Other hemorrhoids: Secondary | ICD-10-CM | POA: Diagnosis present

## 2021-08-24 DIAGNOSIS — E876 Hypokalemia: Secondary | ICD-10-CM | POA: Diagnosis present

## 2021-08-24 DIAGNOSIS — Z79899 Other long term (current) drug therapy: Secondary | ICD-10-CM

## 2021-08-24 DIAGNOSIS — N920 Excessive and frequent menstruation with regular cycle: Secondary | ICD-10-CM | POA: Diagnosis present

## 2021-08-24 DIAGNOSIS — D500 Iron deficiency anemia secondary to blood loss (chronic): Secondary | ICD-10-CM | POA: Diagnosis not present

## 2021-08-24 DIAGNOSIS — D508 Other iron deficiency anemias: Secondary | ICD-10-CM

## 2021-08-24 DIAGNOSIS — D649 Anemia, unspecified: Secondary | ICD-10-CM | POA: Diagnosis present

## 2021-08-24 DIAGNOSIS — R1310 Dysphagia, unspecified: Secondary | ICD-10-CM | POA: Diagnosis present

## 2021-08-24 DIAGNOSIS — F17210 Nicotine dependence, cigarettes, uncomplicated: Secondary | ICD-10-CM | POA: Diagnosis present

## 2021-08-24 LAB — COMPREHENSIVE METABOLIC PANEL
ALT: 8 U/L (ref 0–44)
AST: 13 U/L — ABNORMAL LOW (ref 15–41)
Albumin: 4 g/dL (ref 3.5–5.0)
Alkaline Phosphatase: 36 U/L — ABNORMAL LOW (ref 38–126)
Anion gap: 7 (ref 5–15)
BUN: 11 mg/dL (ref 6–20)
CO2: 25 mmol/L (ref 22–32)
Calcium: 9.2 mg/dL (ref 8.9–10.3)
Chloride: 109 mmol/L (ref 98–111)
Creatinine, Ser: 0.6 mg/dL (ref 0.44–1.00)
GFR, Estimated: >60 mL/min (ref >60)
Glucose, Bld: 110 mg/dL — ABNORMAL HIGH (ref 70–99)
Potassium: 3.2 mmol/L — ABNORMAL LOW (ref 3.5–5.1)
Sodium: 141 mmol/L (ref 135–145)
Total Bilirubin: 0.3 mg/dL (ref 0.3–1.2)
Total Protein: 7.5 g/dL (ref 6.5–8.1)

## 2021-08-24 LAB — CBC WITH DIFFERENTIAL/PLATELET
Abs Immature Granulocytes: 0.01 10*3/uL (ref 0.00–0.07)
Basophils Absolute: 0 10*3/uL (ref 0.0–0.1)
Basophils Relative: 1 %
Eosinophils Absolute: 0.2 10*3/uL (ref 0.0–0.5)
Eosinophils Relative: 3 %
HCT: 25.1 % — ABNORMAL LOW (ref 36.0–46.0)
Hemoglobin: 6.8 g/dL — CL (ref 12.0–15.0)
Immature Granulocytes: 0 %
Lymphocytes Relative: 29 %
Lymphs Abs: 1.8 10*3/uL (ref 0.7–4.0)
MCH: 17.4 pg — ABNORMAL LOW (ref 26.0–34.0)
MCHC: 27.1 g/dL — ABNORMAL LOW (ref 30.0–36.0)
MCV: 64.4 fL — ABNORMAL LOW (ref 80.0–100.0)
Monocytes Absolute: 0.3 10*3/uL (ref 0.1–1.0)
Monocytes Relative: 5 %
Neutro Abs: 3.8 10*3/uL (ref 1.7–7.7)
Neutrophils Relative %: 62 %
Platelets: 186 10*3/uL (ref 150–400)
RBC: 3.9 MIL/uL (ref 3.87–5.11)
RDW: 20 % — ABNORMAL HIGH (ref 11.5–15.5)
WBC: 6 10*3/uL (ref 4.0–10.5)
nRBC: 0 % (ref 0.0–0.2)

## 2021-08-24 LAB — CBC
HCT: 26.5 % — ABNORMAL LOW (ref 36.0–46.0)
Hemoglobin: 7.3 g/dL — ABNORMAL LOW (ref 12.0–15.0)
MCH: 18 pg — ABNORMAL LOW (ref 26.0–34.0)
MCHC: 27.5 g/dL — ABNORMAL LOW (ref 30.0–36.0)
MCV: 65.4 fL — ABNORMAL LOW (ref 80.0–100.0)
Platelets: 162 10*3/uL (ref 150–400)
RBC: 4.05 MIL/uL (ref 3.87–5.11)
RDW: 20.5 % — ABNORMAL HIGH (ref 11.5–15.5)
WBC: 6.2 10*3/uL (ref 4.0–10.5)
nRBC: 0 % (ref 0.0–0.2)

## 2021-08-24 LAB — PREPARE RBC (CROSSMATCH)

## 2021-08-24 LAB — IGA: Immunoglobulin A: 208 mg/dL (ref 47–310)

## 2021-08-24 LAB — TISSUE TRANSGLUTAMINASE, IGA: (tTG) Ab, IgA: <1 U/mL

## 2021-08-24 MED ORDER — SODIUM CHLORIDE 0.9 % IV SOLN
300.0000 mg | Freq: Once | INTRAVENOUS | Status: AC
  Administered 2021-08-24: 300 mg via INTRAVENOUS
  Filled 2021-08-24: qty 15

## 2021-08-24 MED ORDER — PEG-KCL-NACL-NASULF-NA ASC-C 100 G PO SOLR
0.5000 | Freq: Once | ORAL | Status: AC
  Administered 2021-08-25: 100 g via ORAL

## 2021-08-24 MED ORDER — POTASSIUM CHLORIDE 10 MEQ/100ML IV SOLN
10.0000 meq | INTRAVENOUS | Status: DC
  Administered 2021-08-24: 10 meq via INTRAVENOUS
  Filled 2021-08-24: qty 100

## 2021-08-24 MED ORDER — SODIUM CHLORIDE 0.9 % IV SOLN
10.0000 mL/h | Freq: Once | INTRAVENOUS | Status: AC
  Administered 2021-08-24: 10 mL/h via INTRAVENOUS

## 2021-08-24 MED ORDER — PANTOPRAZOLE SODIUM 40 MG IV SOLR
40.0000 mg | Freq: Two times a day (BID) | INTRAVENOUS | Status: DC
  Administered 2021-08-24 – 2021-08-25 (×4): 40 mg via INTRAVENOUS
  Filled 2021-08-24 (×4): qty 10

## 2021-08-24 MED ORDER — PEG-KCL-NACL-NASULF-NA ASC-C 100 G PO SOLR: 1.0000 | Freq: Once | ORAL | Status: DC

## 2021-08-24 MED ORDER — POTASSIUM CHLORIDE CRYS ER 20 MEQ PO TBCR
40.0000 meq | EXTENDED_RELEASE_TABLET | Freq: Once | ORAL | Status: AC
  Administered 2021-08-24: 40 meq via ORAL
  Filled 2021-08-24: qty 2

## 2021-08-24 MED ORDER — PEG-KCL-NACL-NASULF-NA ASC-C 100 G PO SOLR
0.5000 | Freq: Once | ORAL | Status: AC
  Administered 2021-08-24: 100 g via ORAL
  Filled 2021-08-24: qty 1

## 2021-08-24 NOTE — ED Provider Notes (Signed)
[facility] DEPT Provider Note   CSN: [id number] Arrival date & time: 08/24/21  0815     History  Chief Complaint  Patient presents with   Abnormal Lab    Kimberly Gibson is a 26 y.o. female with a PMHx of anemia, who presents to the ED complaining of concerns for an abnormal lab. Pt notes that she was seen in the ED yesterday, however, left AMA. Has associated worsening DOE. She is currently on her menstrual cycle and notes that she will go through a box of super tampons in a day on her menstrual cycle. No meds tried PTA. Denies menorrhagia, hemoptysis, rectal bleeding, fever, palpitations. Patient notes she was to have an endoscopy today however was told that it would not be completed due to her hemoglobin being 6.6.   Per pt chart review: Pt was evaluated in the ED yesterday and had labs drawn that were unremarkable. Pt left AMA.   The history is provided by the patient. No language interpreter was used.       Home Medications Prior to Admission medications   Medication Sig Start Date End Date Taking? Authorizing Provider  famotidine (PEPCID) 40 MG/5ML suspension Take 5 mLs (40 mg total) by mouth at bedtime. 08/18/21   Raspet, Derry Skill, PA-C  sucralfate (CARAFATE) 1 GM/10ML suspension Take 10 mLs (1 g total) by mouth 4 (four) times daily -  with meals and at bedtime. 08/18/21   Raspet, Derry Skill, PA-C      Allergies    Patient has no known allergies.    Review of Systems   Review of Systems  All other systems reviewed and are negative.   Physical Exam Updated Vital Signs BP 124/70 (BP Location: Left Arm)   Pulse 94   Temp 98 F (36.7 C) (Oral)   Resp 16   LMP 07/24/2021 (Within Days)   SpO2 98%  Physical Exam Vitals and nursing note reviewed.  Constitutional:      General: She is not in acute distress.    Appearance: She is not diaphoretic.  HENT:     Head: Normocephalic and atraumatic.     Mouth/Throat:     Pharynx: No oropharyngeal  exudate.  Eyes:     General: No scleral icterus.    Conjunctiva/sclera: Conjunctivae normal.  Cardiovascular:     Rate and Rhythm: Normal rate and regular rhythm.     Pulses: Normal pulses.     Heart sounds: Normal heart sounds.  Pulmonary:     Effort: Pulmonary effort is normal. No respiratory distress.     Breath sounds: Normal breath sounds. No wheezing.  Abdominal:     General: Bowel sounds are normal.     Palpations: Abdomen is soft. There is no mass.     Tenderness: There is no abdominal tenderness. There is no guarding or rebound.  Musculoskeletal:        General: Normal range of motion.     Cervical back: Normal range of motion and neck supple.     Comments: Strength and sensation intact to bilateral upper and lower extremities.  Skin:    General: Skin is warm and dry.  Neurological:     Mental Status: She is alert.  Psychiatric:        Behavior: Behavior normal.    ED Results / Procedures / Treatments   Labs (all labs ordered are listed, but only abnormal results are displayed) Labs Reviewed - No data to display  EKG None  Radiology No results found.  Procedures Procedures    Medications Ordered in ED Medications - No data to display  ED Course/ Medical Decision Making/ A&P Clinical Course as of 08/24/21 1448  Wed Aug 24, 2021  1008 Hemoglobin(!!): 6.8 [SB]  1022 Patient reevaluated resting comfortably stretcher.  Discussed with patient lab findings and treatment with unit of blood here in the ED.  Patient agreeable this time. [SB]  1135 Patient re-evaluated and resting comfortably on stretcher. Discussed with patient admission treatment plan at this time. Patient notes that she is unable to be admitted due to having to take care of her kids and needing to work. Discussed with patient importance of being admitted to have her symptoms monitored and re-evaluated if necessary for repeat blood transfusion. Pt notes that she will call family and see if she can  arrange care for her children. Will re-evaluate.  [SB]  1312 Went to discuss with patient regarding admission plans. Pt tearful and on the phone with someone. Patient asked me to come back. Will come back and reassess.  [SB]  3825 Discussed with patient regarding admission, patient agreeable at this time.  [SB]    Clinical Course User Index [SB] Adelma Bowdoin A, PA-C                           Medical Decision Making Amount and/or Complexity of Data Reviewed Labs: ordered. Decision-making details documented in ED Course.  Risk Prescription drug management. Decision regarding hospitalization.   Pt presents with concerns for abnormal lab onset today.  Patient was to have an endoscopy completed today however was sent to the ED due to an decreased hemoglobin level.  Has a history of iron deficiency anemia.  Does not take iron supplements.  Was told that her iron deficiency anemia was due to her heavy menstrual cycles.  Patient afebrile, not tachycardic or hypoxic.  On exam patient without cardiovascular, respiratory, down exam findings.  Differential diagnosis includes anemia, electrolyte abnormality, arrhythmia.    Co morbidities that complicate the patient evaluation: Iron deficiency anemia (not on iron supplementation)  Additional history obtained:  External records from outside source obtained and reviewed including: Pt was evaluated in the ED yesterday and had labs drawn that were unremarkable. Pt left AMA.   Labs:  I ordered, and personally interpreted labs.  The pertinent results include:   CBC with hemoglobin downtrending at 6.8, decreased from yesterday's value of 6.9 otherwise unremarkable. Type and screen ordered with results pending. CMP with slightly decreased potassium at 3.2 otherwise unremarkable  Critical Interventions pRBC   Consultations: I requested consultation with the Hospitalist, Dr. Posey Pronto and discussed lab and imaging findings as well as pertinent plan - they  recommend: Admission to the hospital as well as consult with gastroenterologist    Disposition: Presentation suspicious for symptomatic anemia in the setting of heavy menstrual periods.  Doubt electrolyte abnormality.  Doubt ACS, patient without chest pain or palpitations at this time.  After consideration of the diagnostic results and the patients response to treatment, I feel that the patient would benefit from Admission to the hospital.  Discussed with patient plans for admission.  Answered all available questions.  Patient appears safe for admission at this time.      This chart was dictated using voice recognition software, Dragon. Despite the best efforts of this provider to proofread and correct errors, errors may still occur which can change documentation meaning.   Final Clinical  Impression(s) / ED Diagnoses Final diagnoses:  Symptomatic anemia    Rx / DC Orders ED Discharge Orders     None         Keviana Guida A, PA-C 08/24/21 1540    Pattricia Boss, MD 08/24/21 1621

## 2021-08-24 NOTE — Telephone Encounter (Signed)
Good Morning Dr. Candis Schatz,  I called this patient and she is at the ER having a Blood transfusion.  She will not be able to make her procedure today.    She will call back to reschedule

## 2021-08-24 NOTE — H&P (Addendum)
History and Physical    Patient: Kimberly Gibson ERD:[id number] DOB: 1996/02/10 DOA: 08/24/2021 DOS: the patient was seen and examined on 08/24/2021 PCP: Janyth Contes, MD  Patient coming from: Home  Chief Complaint:  Chief Complaint  Patient presents with   Abnormal Lab   HPI: Kimberly Gibson is a 26 y.o. female with medical history significant of anemia, GERD, weight loss here for symptomatic anemia.  Reports long history of symptomatic anemia, dating back to 2021.  She came here yesterday for a planned endoscope, was found to have a low hemoglobin and recommended go to the ED for transfusion.  She subsequently had to leave to arrange childcare.  She represented today.  Patient reports fatigue and weakness all the time not significantly worse today than other days.  She reports shortness of breath with exertion that has been going on for some time but not worse than normal.  No chest pain, no syncope, no headache.  She reports she gets lightheaded if she moves too fast.  She denies any blood in her stool, blood in her urine, vomiting of blood.  She reports her blood loss is due to heavy menses.  Her menses started 5 days ago, usually last 6 to 7 days.  She normally goes to 3 boxes of tampons during her period.  Pt reports easy bruising on leg.Some bleeding with gums with brushing.No nosebleeds.  No family history of easy bleeding.  She has not seen a hematologist in the past.  Her symptoms started back in 2021 of heavy menses.  Reports history of GERD but been worse since her second pregnancy.  Reports progressive pain in her esophagus if she eats a bite of food, has lost about 30 pounds over the past several months.  Review of Systems: As mentioned in the history of present illness. All other systems reviewed and are negative. Past Medical History:  Diagnosis Date   BV (bacterial vaginosis)    Enlarged pituitary gland (HCC)    per pt report   GERD (gastroesophageal reflux disease)     Microcytic anemia    Normal pregnancy in multigravida, antepartum 12/11/2017   SVD (spontaneous vaginal delivery) 12/12/2017   Past Surgical History:  Procedure Laterality Date   CLOSED REDUCTION METACARPAL WITH PERCUTANEOUS PINNING Right 08/08/2019   Procedure: CLOSED REDUCTION WITH PERCUTANEOUS PINNING RIGHT SMALL METACARPAL BASE FRACTURE;  Surgeon: Leanora Cover, MD;  Location: [facility];  Service: Orthopedics;  Laterality: Right;   WISDOM TOOTH EXTRACTION     Social History:  reports that she has been smoking cigarettes and cigars. She has been smoking an average of 2 packs per day. She has never used smokeless tobacco. She reports that she does not drink alcohol and does not use drugs.  No Known Allergies  Family History  Problem Relation Age of Onset   Asthma Sister    Hearing loss Neg Hx    Colon cancer Neg Hx    Esophageal cancer Neg Hx    Stomach cancer Neg Hx     Prior to Admission medications   Medication Sig Start Date End Date Taking? Authorizing Provider  famotidine (PEPCID) 40 MG/5ML suspension Take 5 mLs (40 mg total) by mouth at bedtime. 08/18/21  Yes Raspet, Erin K, PA-C  sucralfate (CARAFATE) 1 GM/10ML suspension Take 10 mLs (1 g total) by mouth 4 (four) times daily -  with meals and at bedtime. 08/18/21  Yes Raspet, Derry Skill, PA-C    Physical Exam: Vitals:   08/24/21 1300  08/24/21 1330 08/24/21 1402 08/24/21 1409  BP: 116/82 121/86 112/73 107/79  Pulse: 78 78 67 72  Resp: '19 12 19 16  '$ Temp:    97.9 F (36.6 C)  TempSrc:    Oral  SpO2: 100% 100% 100%    Physical Exam Vitals and nursing note reviewed.  Constitutional:      Appearance: Normal appearance. She is not ill-appearing.  HENT:     Head: Normocephalic and atraumatic.     Mouth/Throat:     Mouth: Mucous membranes are moist.  Cardiovascular:     Rate and Rhythm: Normal rate and regular rhythm.  Abdominal:     General: Abdomen is flat. There is no distension.     Palpations: Abdomen is soft. There is  no mass.     Tenderness: There is no abdominal tenderness.  Musculoskeletal:     Right lower leg: No edema.     Left lower leg: No edema.  Skin:    General: Skin is warm and dry.     Capillary Refill: Capillary refill takes less than 2 seconds.  Neurological:     Mental Status: She is alert.  Psychiatric:        Mood and Affect: Mood normal.        Behavior: Behavior normal.    Data Reviewed:     Latest Ref Rng & Units 08/24/2021    9:05 AM 08/23/2021    4:19 PM 08/18/2021   12:10 PM  CBC  WBC 4.0 - 10.5 K/uL 6.0  6.4  5.3   Hemoglobin 12.0 - 15.0 g/dL 6.8  6.9 Repeated and verified X2.  6.6   Hematocrit 36.0 - 46.0 % 25.1  25.0 Repeated and verified X2.  25.1   Platelets 150 - 400 K/uL 186  189.0  298        Latest Ref Rng & Units 08/24/2021    9:05 AM 08/18/2021   12:10 PM 12/09/2020   12:54 PM  BMP  Glucose 70 - 99 mg/dL 110  89  100   BUN 6 - 20 mg/dL '11  8  6   '$ Creatinine 0.44 - 1.00 mg/dL 0.60  0.47  0.62   Sodium 135 - 145 mmol/L 141  136  138   Potassium 3.5 - 5.1 mmol/L 3.2  3.4  3.4   Chloride 98 - 111 mmol/L 109  105  106   CO2 22 - 32 mmol/L '25  22  25   '$ Calcium 8.9 - 10.3 mg/dL 9.2  9.2  9.1      Assessment and Plan:  Kimberly Gibson is a 26 y.o. female with medical history significant of iron deficiency anemia, GERD, weight loss here for symptomatic anemia and dysphagia.  Symptomatic Anemia Hx of IDA Hx of heavy menses History of symptomatic iron deficiency anemia dating back to 2020/2021, with symptoms of fatigue, weakness, lightheadedness with exertion, shortness of breath with exertion.  She reports around that time developed heavy menses that last about 5 to 7 days, goes through 3 box of tampons over that period.  She reports no other sources of blood loss at this time.  She reports easy bruising in her legs, some bleeding in her gums, no spontaneous nosebleeds she does.  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 -type and screen, transfusion started in the ED -Hemoglobin after transfusion -Transfuse for hemoglobin less than 7 or symptoms not resolving -Recommend outpatient  work-up for bleeding - Nutritionist evaluation  Weight loss Dysphagia GERD Patient reports progressive dysphagia with pain in her esophagus, that radiates to her back whenever she tries to eat food.  Symptoms she reports food gets stuck in her esophagus.  She also reports that she gets full very easily when she is able to eat anything.  The symptoms have been going on since 2021, reports significant weight loss from 140 pounds down to 105 pounds.  GI was previously planning to scope her and obtain a CT abdomen. -GI consulted, appreciate recommendations, plan for scope tomorrow AM - N.p.o. after midnight - IV PPI twice daily -IV iron   Hypokalemia, replete with potassium  DVT ppx: None Diet: Full clear, NPO at midnight Advance Care Planning:   Code Status: Prior Full Consults: GI Family Communication: family not at bedside  Severity of Illness: The appropriate patient status for this patient is OBSERVATION. Observation status is judged to be reasonable and necessary in order to provide the required intensity of service to ensure the patient's safety. The patient's presenting symptoms, physical exam findings, and initial radiographic and laboratory data in the context of their medical condition is felt to place them at decreased risk for further clinical deterioration. Furthermore, it is anticipated that the patient will be medically stable for discharge from the hospital within 2 midnights of admission.   Author: Lorelei Pont, MD 08/24/2021 2:18 PM  For on call review [email or web address].

## 2021-08-24 NOTE — Consult Note (Addendum)
                                             Consultation Note   Referring Provider: [facility] PCP: Gibson, Jody, MD Primary Gastroenterologist: Kimberly Cunningham, MD Reason for consultation: anemia  Hospital Day: 1   Attending physician's note  I have taken a history, reviewed the chart and examined the patient. I performed a substantive portion of this encounter, including complete performance of at least one of the key components, in conjunction with the APP. I agree with the APP's note, impression and recommendations.    26-year-old very pleasant female with severe iron deficiency anemia and weight loss. She has severe menorrhagia is likely contributing to iron deficiency but will need to exclude GI pathology or neoplastic lesion No family history of IBD or GI malignancy Schedule for EGD and colonoscopy tomorrow, if unrevealing for any high risk lesion for GI blood loss, she will need follow-up with GYN for management of menorrhagia  The risks and benefits as well as alternatives of endoscopic procedure(s) have been discussed and reviewed. All questions answered. The patient agrees to proceed.   The patient was provided an opportunity to ask questions and all were answered. The patient agreed with the plan and demonstrated an understanding of the instructions.  K. Veena [name] , MD [phone]    Assessment   26 yo female with severe iron deficiency most likely related to menstrual losses. Folate, B12 normal. Ferritin is < 2. Celiac serology pending from yesterday.   Chronic intermittent dysphagia / intermittent odynophagia Etiology unclear.   Unintentional weight loss. Reports 40 pound loss since June 2021. Weight loss started during very stressful time but has continued despite partial relief of stressful situation.   Hypokalemia, K+ 3.2. Will ask [facility] to replete so hypokalemia doesn't get worse and preclude endoscopic procedures.   See PMH for additional  medical problems  Plan   Plan for EGD and colonoscopy to evaluation IDA, weight loss and intermittent odynophagia and dysphagia. Schedule for a colonoscopy to be done tomorrow. The risks and benefits of EGD and colonoscopy with possible polypectomy / biopsies were discussed and the patient agrees to proceed.  She will not take oral iron. I spoke with [facility] who will order a dose of IV iron.  Will decide on utility CT scan after EGD  / colonoscopy.   HPI   Kimberly Gibson is a 26 y.o. female with a past medical history significant for  iron deficiency anemia.  See PMH for any additional medical problems.  Patient established care with us yesterday for evaluation of chronic iron deficiency anemia and unintentional weight loss. Below is excerpt from Dr. Cunningham's office note:   "The patient states that she normally weighs between 130 and 135 pounds.  She currently weighs 102 pounds.  She started losing weight in 2021.  This was during a period of extreme stress for the patient related to a custody battle over her children with her husband.  That stressful period has resolved, but the patient still continues to lose weight.  She does admit that stress and anxiety are still a problem for her, and she states that she follows closely with a therapist.  The patient reports that she is unable to eat very much food at all.  She gets very full after a few bites of food.    She does get nauseated frequently as well after she eats, but she only rarely vomits.  She denies having significant abdominal pain either after eating or on an empty stomach. She also reports difficulty swallowing both food and pills.  She feels like food sometimes just will not go down and she sometimes has discomfort is food travels down her esophagus.  She does have a longstanding history of GERD symptoms dating back to when she was a child.  She reports ongoing frequent symptoms of heartburn and acid reflux.  She does not take any medications  for this. She is having normal bowel movements currently, with formed brown soft stools.  She had problems with constipation recently, as well as symptomatic hemorrhoids.  She had been seeing bright red blood on the toilet paper for a while in the setting of hard stools and straining.  Both these problems have been resolved for some time now.  Diarrhea is not a problem for her.   The patient is noted to have severe microcytic anemia.  The patient is aware that she has had low hemoglobin in the past.  She has been prescribed oral iron in the past, but does not tolerate them.  She says she has also received IV iron infusions. She went to urgent care last week and was found to have a hemoglobin of 6.6.  She was apparently not advised to go to the emergency department for a blood transfusion.  Her baseline hemoglobin in the past 2 years seems to be between 7 and 8. The patient denies any history of overt GI bleeding, other than the scant bright red blood per rectum she was seeing when she was having hemorrhoids.  She denies any melanic stool. She does have very heavy periods which started after her pregnancy in 2019 (hemoglobin 12 in April 2019, 9.5 in October 2019, and has not been within normal range since)..  This time point also correlates with when her anemia first became a problem.  She reports that during her menstrual periods, she will bleed heavily for 5 days, typically going through 2-1/2 boxes of tampons during that time."  Interval history:  She was scheduled for a CT scan to evaluate ongoing weight loss. She was supposed to have an EGD today but Hgb was < 7 and procedure postponed. Sent to ED for a blood transfusion.   Data Reviewed:  Ferritin 1.6 K+ 3.2.  Hgb 6.8., Platelets 186.   She has just received a unit of PRBCs. She feels okay.     Recent Labs and Imaging No results found.  Labs:  Recent Labs    08/23/21 1619 08/24/21 0905  WBC 6.4 6.0  HGB 6.9 Repeated and verified X2.*  6.8*  HCT 25.0 Repeated and verified X2.* 25.1*  PLT 189.0 186   Recent Labs    08/24/21 0905  NA 141  K 3.2*  CL 109  CO2 25  GLUCOSE 110*  BUN 11  CREATININE 0.60  CALCIUM 9.2   Recent Labs    08/24/21 0905  PROT 7.5  ALBUMIN 4.0  AST 13*  ALT 8  ALKPHOS 36*  BILITOT 0.3   No results for input(s): "HEPBSAG", "HCVAB", "HEPAIGM", "HEPBIGM" in the last 72 hours. No results for input(s): "LABPROT", "INR" in the last 72 hours.  Past Medical History:  Diagnosis Date   BV (bacterial vaginosis)    Enlarged pituitary gland (HCC)    per pt report   GERD (gastroesophageal reflux disease)    Microcytic   anemia    Normal pregnancy in multigravida, antepartum 12/11/2017   SVD (spontaneous vaginal delivery) 12/12/2017    Past Surgical History:  Procedure Laterality Date   CLOSED REDUCTION METACARPAL WITH PERCUTANEOUS PINNING Right 08/08/2019   Procedure: CLOSED REDUCTION WITH PERCUTANEOUS PINNING RIGHT SMALL METACARPAL BASE FRACTURE;  Surgeon: Kuzma, Kevin, MD;  Location: [facility];  Service: Orthopedics;  Laterality: Right;   WISDOM TOOTH EXTRACTION      Family History  Problem Relation Age of Onset   Asthma Sister    Hearing loss Neg Hx    Colon cancer Neg Hx    Esophageal cancer Neg Hx    Stomach cancer Neg Hx     Prior to Admission medications   Medication Sig Start Date End Date Taking? Authorizing Provider  famotidine (PEPCID) 40 MG/5ML suspension Take 5 mLs (40 mg total) by mouth at bedtime. 08/18/21  Yes Raspet, Erin K, PA-C  sucralfate (CARAFATE) 1 GM/10ML suspension Take 10 mLs (1 g total) by mouth 4 (four) times daily -  with meals and at bedtime. 08/18/21  Yes Raspet, Erin K, PA-C    Current Facility-Administered Medications  Medication Dose Route Frequency Provider Last Rate Last Admin   pantoprazole (PROTONIX) injection 40 mg  40 mg Intravenous Q12H Patel, Rajiv C, MD   40 mg at 08/24/21 1530   potassium chloride 10 mEq in 100 mL IVPB  10 mEq Intravenous Q1  Hr x 2 Patel, Rajiv C, MD       Current Outpatient Medications  Medication Sig Dispense Refill   famotidine (PEPCID) 40 MG/5ML suspension Take 5 mLs (40 mg total) by mouth at bedtime. 150 mL 1   sucralfate (CARAFATE) 1 GM/10ML suspension Take 10 mLs (1 g total) by mouth 4 (four) times daily -  with meals and at bedtime. 420 mL 0    Allergies as of 08/24/2021   (No Known Allergies)    Social History   Socioeconomic History   Marital status: Single    Spouse name: Not on file   Number of children: Not on file   Years of education: Not on file   Highest education level: Not on file  Occupational History   Not on file  Tobacco Use   Smoking status: Some Days    Types: Cigarettes, Cigars   Smokeless tobacco: Never   Tobacco comments:    Black and off, on and off 2 years  Vaping Use   Vaping Use: Never used  Substance and Sexual Activity   Alcohol use: No   Drug use: No   Sexual activity: Yes    Birth control/protection: None  Other Topics Concern   Not on file  Social History Narrative   Not on file   Social Determinants of Health   Financial Resource Strain: Low Risk  (12/12/2017)   Overall Financial Resource Strain (CARDIA)    Difficulty of Paying Living Expenses: Not hard at all  Food Insecurity: No Food Insecurity (12/12/2017)   Hunger Vital Sign    Worried About Running Out of Food in the Last Year: Never true    Ran Out of Food in the Last Year: Never true  Transportation Needs: No Transportation Needs (12/12/2017)   PRAPARE - Transportation    Lack of Transportation (Medical): No    Lack of Transportation (Non-Medical): No  Physical Activity: Inactive (12/12/2017)   Exercise Vital Sign    Days of Exercise per Week: 0 days    Minutes of Exercise per Session: 0 min    Stress: No Stress Concern Present (12/12/2017)   [employer] - Occupational Stress Questionnaire    Feeling of Stress : Only a little  Social Connections: Unknown  (12/12/2017)   Social Connection and Isolation Panel [NHANES]    Frequency of Communication with Friends and Family: Not asked    Frequency of Social Gatherings with Friends and Family: Not on file    Attends Religious Services: Not on file    Active Member of Clubs or Organizations: Not on file    Attends Club or Organization Meetings: Not on file    Marital Status: Not on file  Intimate Partner Violence: Not on file    Review of Systems: All systems reviewed and negative except where noted in HPI.  Physical Exam: Vital signs in last 24 hours: Temp:  [97.9 F (36.6 C)-98.9 F (37.2 C)] 97.9 F (36.6 C) (06/28 1409) Pulse Rate:  [67-94] 70 (06/28 1430) Resp:  [12-19] 18 (06/28 1430) BP: (91-124)/(65-87) 112/79 (06/28 1430) SpO2:  [98 %-100 %] 100 % (06/28 1430)    General:  Alert thin female in NAD Psych:  Pleasant, cooperative. Normal mood and affect Eyes: Pupils equal, no icterus. Conjunctive pink Ears:  Normal auditory acuity Nose: No deformity, discharge or lesions Neck:  Supple, no masses felt Lungs:  Clear to auscultation.  Heart:  Regular rate, regular rhythm. No lower extremity edema Abdomen:  Soft, nondistended, nontender, active bowel sounds, no masses felt Rectal :  Deferred Msk: Symmetrical without gross deformities.  Neurologic:  Alert, oriented, grossly normal neurologically Skin:  Intact without significant lesions.    Intake/Output from previous day: No intake/output data recorded. Intake/Output this shift:  Total I/O In: 315 [Blood:315] Out: -     Principal Problem:   Anemia    Paula Guenther, NP-C @  08/24/2021, 3:32 PM     

## 2021-08-24 NOTE — H&P (View-Only) (Signed)
Consultation Note   Referring Provider: [facility] PCP: Janyth Contes, MD Primary Gastroenterologist: Dustin Flock, MD Reason for consultation: anemia  Hospital Day: 1   Attending physician's note  I have taken a history, reviewed the chart and examined the patient. I performed a substantive portion of this encounter, including complete performance of at least one of the key components, in conjunction with the APP. I agree with the APP's note, impression and recommendations.    26 year old very pleasant female with severe iron deficiency anemia and weight loss. She has severe menorrhagia is likely contributing to iron deficiency but will need to exclude GI pathology or neoplastic lesion No family history of IBD or GI malignancy Schedule for EGD and colonoscopy tomorrow, if unrevealing for any high risk lesion for GI blood loss, she will need follow-up with GYN for management of menorrhagia  The risks and benefits as well as alternatives of endoscopic procedure(s) have been discussed and reviewed. All questions answered. The patient agrees to proceed.   The patient was provided an opportunity to ask questions and all were answered. The patient agreed with the plan and demonstrated an understanding of the instructions.  Damaris Hippo , MD [phone]    Assessment   26 yo female with severe iron deficiency most likely related to menstrual losses. Folate, B12 normal. Ferritin is < 2. Celiac serology pending from yesterday.   Chronic intermittent dysphagia / intermittent odynophagia Etiology unclear.   Unintentional weight loss. Reports 40 pound loss since June 2021. Weight loss started during very stressful time but has continued despite partial relief of stressful situation.   Hypokalemia, K+ 3.2. Will ask [facility] to replete so hypokalemia doesn't get worse and preclude endoscopic procedures.   See PMH for additional  medical problems  Plan   Plan for EGD and colonoscopy to evaluation IDA, weight loss and intermittent odynophagia and dysphagia. Schedule for a colonoscopy to be done tomorrow. The risks and benefits of EGD and colonoscopy with possible polypectomy / biopsies were discussed and the patient agrees to proceed.  She will not take oral iron. I spoke with [facility] who will order a dose of IV iron.  Will decide on utility CT scan after EGD  / colonoscopy.   HPI   DALAYNA LAUTER is a 26 y.o. female with a past medical history significant for  iron deficiency anemia.  See PMH for any additional medical problems.  Patient established care with [place] yesterday for evaluation of chronic iron deficiency anemia and unintentional weight loss. Below is excerpt from Dr. Dayle Points office note:   "The patient states that she normally weighs between 130 and 135 pounds.  She currently weighs 102 pounds.  She started losing weight in 2021.  This was during a period of extreme stress for the patient related to a custody battle over her children with her husband.  That stressful period has resolved, but the patient still continues to lose weight.  She does admit that stress and anxiety are still a problem for her, and she states that she follows closely with a therapist.  The patient reports that she is unable to eat very much food at all.  She gets very full after a few bites of food.  She does get nauseated frequently as well after she eats, but she only rarely vomits.  She denies having significant abdominal pain either after eating or on an empty stomach. She also reports difficulty swallowing both food and pills.  She feels like food sometimes just will not go down and she sometimes has discomfort is food travels down her esophagus.  She does have a longstanding history of GERD symptoms dating back to when she was a child.  She reports ongoing frequent symptoms of heartburn and acid reflux.  She does not take any medications  for this. She is having normal bowel movements currently, with formed brown soft stools.  She had problems with constipation recently, as well as symptomatic hemorrhoids.  She had been seeing bright red blood on the toilet paper for a while in the setting of hard stools and straining.  Both these problems have been resolved for some time now.  Diarrhea is not a problem for her.   The patient is noted to have severe microcytic anemia.  The patient is aware that she has had low hemoglobin in the past.  She has been prescribed oral iron in the past, but does not tolerate them.  She says she has also received IV iron infusions. She went to urgent care last week and was found to have a hemoglobin of 6.6.  She was apparently not advised to go to the emergency department for a blood transfusion.  Her baseline hemoglobin in the past 2 years seems to be between 7 and 8. The patient denies any history of overt GI bleeding, other than the scant bright red blood per rectum she was seeing when she was having hemorrhoids.  She denies any melanic stool. She does have very heavy periods which started after her pregnancy in 2019 (hemoglobin 12 in April 2019, 9.5 in October 2019, and has not been within normal range since)..  This time point also correlates with when her anemia first became a problem.  She reports that during her menstrual periods, she will bleed heavily for 5 days, typically going through 2-1/2 boxes of tampons during that time."  Interval history:  She was scheduled for a CT scan to evaluate ongoing weight loss. She was supposed to have an EGD today but Hgb was < 7 and procedure postponed. Sent to ED for a blood transfusion.   Data Reviewed:  Ferritin 1.6 K+ 3.2.  Hgb 6.8., Platelets 186.   She has just received a unit of PRBCs. She feels okay.     Recent Labs and Imaging No results found.  Labs:  Recent Labs    08/23/21 1619 08/24/21 0905  WBC 6.4 6.0  HGB 6.9 Repeated and verified X2.*  6.8*  HCT 25.0 Repeated and verified X2.* 25.1*  PLT 189.0 186   Recent Labs    08/24/21 0905  NA 141  K 3.2*  CL 109  CO2 25  GLUCOSE 110*  BUN 11  CREATININE 0.60  CALCIUM 9.2   Recent Labs    08/24/21 0905  PROT 7.5  ALBUMIN 4.0  AST 13*  ALT 8  ALKPHOS 36*  BILITOT 0.3   No results for input(s): "HEPBSAG", "HCVAB", "HEPAIGM", "HEPBIGM" in the last 72 hours. No results for input(s): "LABPROT", "INR" in the last 72 hours.  Past Medical History:  Diagnosis Date   BV (bacterial vaginosis)    Enlarged pituitary gland (HCC)    per pt report   GERD (gastroesophageal reflux disease)    Microcytic  anemia    Normal pregnancy in multigravida, antepartum 12/11/2017   SVD (spontaneous vaginal delivery) 12/12/2017    Past Surgical History:  Procedure Laterality Date   CLOSED REDUCTION METACARPAL WITH PERCUTANEOUS PINNING Right 08/08/2019   Procedure: CLOSED REDUCTION WITH PERCUTANEOUS PINNING RIGHT SMALL METACARPAL BASE FRACTURE;  Surgeon: Leanora Cover, MD;  Location: [facility];  Service: Orthopedics;  Laterality: Right;   WISDOM TOOTH EXTRACTION      Family History  Problem Relation Age of Onset   Asthma Sister    Hearing loss Neg Hx    Colon cancer Neg Hx    Esophageal cancer Neg Hx    Stomach cancer Neg Hx     Prior to Admission medications   Medication Sig Start Date End Date Taking? Authorizing Provider  famotidine (PEPCID) 40 MG/5ML suspension Take 5 mLs (40 mg total) by mouth at bedtime. 08/18/21  Yes Raspet, Erin K, PA-C  sucralfate (CARAFATE) 1 GM/10ML suspension Take 10 mLs (1 g total) by mouth 4 (four) times daily -  with meals and at bedtime. 08/18/21  Yes Raspet, Derry Skill, PA-C    Current Facility-Administered Medications  Medication Dose Route Frequency Provider Last Rate Last Admin   pantoprazole (PROTONIX) injection 40 mg  40 mg Intravenous Q12H Ulyess Blossom C, MD   40 mg at 08/24/21 1530   potassium chloride 10 mEq in 100 mL IVPB  10 mEq Intravenous Q1  Hr x 2 Lorelei Pont, MD       Current Outpatient Medications  Medication Sig Dispense Refill   famotidine (PEPCID) 40 MG/5ML suspension Take 5 mLs (40 mg total) by mouth at bedtime. 150 mL 1   sucralfate (CARAFATE) 1 GM/10ML suspension Take 10 mLs (1 g total) by mouth 4 (four) times daily -  with meals and at bedtime. 420 mL 0    Allergies as of 08/24/2021   (No Known Allergies)    Social History   Socioeconomic History   Marital status: Single    Spouse name: Not on file   Number of children: Not on file   Years of education: Not on file   Highest education level: Not on file  Occupational History   Not on file  Tobacco Use   Smoking status: Some Days    Types: Cigarettes, Cigars   Smokeless tobacco: Never   Tobacco comments:    Black and off, on and off 2 years  Vaping Use   Vaping Use: Never used  Substance and Sexual Activity   Alcohol use: No   Drug use: No   Sexual activity: Yes    Birth control/protection: None  Other Topics Concern   Not on file  Social History Narrative   Not on file   Social Determinants of Health   Financial Resource Strain: Low Risk  (12/12/2017)   Overall Financial Resource Strain (CARDIA)    Difficulty of Paying Living Expenses: Not hard at all  Food Insecurity: No Food Insecurity (12/12/2017)   Hunger Vital Sign    Worried About Running Out of Food in the Last Year: Never true    Ran Out of Food in the Last Year: Never true  Transportation Needs: No Transportation Needs (12/12/2017)   PRAPARE - Hydrologist (Medical): No    Lack of Transportation (Non-Medical): No  Physical Activity: Inactive (12/12/2017)   Exercise Vital Sign    Days of Exercise per Week: 0 days    Minutes of Exercise per Session: 0 min  Stress: No Stress Concern Present (12/12/2017)   [place]    Feeling of Stress : Only a little  Social Connections: Unknown  (12/12/2017)   Social Connection and Isolation Panel [NHANES]    Frequency of Communication with Friends and Family: Not asked    Frequency of Social Gatherings with Friends and Family: Not on file    Attends Religious Services: Not on file    Active Member of Clubs or Organizations: Not on file    Attends Archivist Meetings: Not on file    Marital Status: Not on file  Intimate Partner Violence: Not on file    Review of Systems: All systems reviewed and negative except where noted in HPI.  Physical Exam: Vital signs in last 24 hours: Temp:  [97.9 F (36.6 C)-98.9 F (37.2 C)] 97.9 F (36.6 C) (06/28 1409) Pulse Rate:  [67-94] 70 (06/28 1430) Resp:  [12-19] 18 (06/28 1430) BP: (91-124)/(65-87) 112/79 (06/28 1430) SpO2:  [98 %-100 %] 100 % (06/28 1430)    General:  Alert thin female in NAD Psych:  Pleasant, cooperative. Normal mood and affect Eyes: Pupils equal, no icterus. Conjunctive pink Ears:  Normal auditory acuity Nose: No deformity, discharge or lesions Neck:  Supple, no masses felt Lungs:  Clear to auscultation.  Heart:  Regular rate, regular rhythm. No lower extremity edema Abdomen:  Soft, nondistended, nontender, active bowel sounds, no masses felt Rectal :  Deferred Msk: Symmetrical without gross deformities.  Neurologic:  Alert, oriented, grossly normal neurologically Skin:  Intact without significant lesions.    Intake/Output from previous day: No intake/output data recorded. Intake/Output this shift:  Total I/O In: 315 [Blood:315] Out: -     Principal Problem:   Anemia    Tye Savoy, NP-C @  08/24/2021, 3:32 PM

## 2021-08-24 NOTE — ED Triage Notes (Signed)
Pt was seen yesterday in ED for abnormally low Hgb but left AMA. Pt states she was due for endoscopy, but GI was unable to perform procedure due to CBC results. Pt endorses worsening DOE, denies dizziness. Pt states she is currently on her menstrual cycle.

## 2021-08-25 ENCOUNTER — Encounter (HOSPITAL_COMMUNITY)
Admission: EM | Disposition: A | Discharge status: Home / Self Care | Payer: Self-pay | Source: Home / Self Care | Attending: Internal Medicine

## 2021-08-25 ENCOUNTER — Encounter (HOSPITAL_COMMUNITY): Payer: Self-pay | Admitting: Internal Medicine

## 2021-08-25 ENCOUNTER — Ambulatory Visit: Payer: Medicaid Other | Admitting: Nurse Practitioner

## 2021-08-25 ENCOUNTER — Inpatient Hospital Stay (HOSPITAL_COMMUNITY): Payer: Medicaid Other

## 2021-08-25 ENCOUNTER — Observation Stay (HOSPITAL_COMMUNITY): Payer: Medicaid Other | Admitting: Registered Nurse

## 2021-08-25 ENCOUNTER — Observation Stay (HOSPITAL_BASED_OUTPATIENT_CLINIC_OR_DEPARTMENT_OTHER): Payer: Medicaid Other | Admitting: Registered Nurse

## 2021-08-25 DIAGNOSIS — D649 Anemia, unspecified: Secondary | ICD-10-CM | POA: Diagnosis present

## 2021-08-25 DIAGNOSIS — K648 Other hemorrhoids: Secondary | ICD-10-CM

## 2021-08-25 DIAGNOSIS — K2100 Gastro-esophageal reflux disease with esophagitis, without bleeding: Secondary | ICD-10-CM | POA: Diagnosis present

## 2021-08-25 DIAGNOSIS — D62 Acute posthemorrhagic anemia: Secondary | ICD-10-CM | POA: Diagnosis present

## 2021-08-25 DIAGNOSIS — Z681 Body mass index (BMI) 19 or less, adult: Secondary | ICD-10-CM | POA: Diagnosis not present

## 2021-08-25 DIAGNOSIS — D509 Iron deficiency anemia, unspecified: Secondary | ICD-10-CM

## 2021-08-25 DIAGNOSIS — Z825 Family history of asthma and other chronic lower respiratory diseases: Secondary | ICD-10-CM | POA: Diagnosis not present

## 2021-08-25 DIAGNOSIS — Z79899 Other long term (current) drug therapy: Secondary | ICD-10-CM | POA: Diagnosis not present

## 2021-08-25 DIAGNOSIS — F17210 Nicotine dependence, cigarettes, uncomplicated: Secondary | ICD-10-CM | POA: Diagnosis present

## 2021-08-25 DIAGNOSIS — K644 Residual hemorrhoidal skin tags: Secondary | ICD-10-CM | POA: Diagnosis present

## 2021-08-25 DIAGNOSIS — N920 Excessive and frequent menstruation with regular cycle: Secondary | ICD-10-CM | POA: Diagnosis present

## 2021-08-25 DIAGNOSIS — R1319 Other dysphagia: Secondary | ICD-10-CM

## 2021-08-25 DIAGNOSIS — R634 Abnormal weight loss: Secondary | ICD-10-CM | POA: Diagnosis present

## 2021-08-25 DIAGNOSIS — R1310 Dysphagia, unspecified: Secondary | ICD-10-CM | POA: Diagnosis present

## 2021-08-25 DIAGNOSIS — E876 Hypokalemia: Secondary | ICD-10-CM | POA: Diagnosis present

## 2021-08-25 DIAGNOSIS — F17200 Nicotine dependence, unspecified, uncomplicated: Secondary | ICD-10-CM

## 2021-08-25 DIAGNOSIS — D500 Iron deficiency anemia secondary to blood loss (chronic): Secondary | ICD-10-CM | POA: Diagnosis not present

## 2021-08-25 DIAGNOSIS — F419 Anxiety disorder, unspecified: Secondary | ICD-10-CM | POA: Diagnosis present

## 2021-08-25 DIAGNOSIS — K5900 Constipation, unspecified: Secondary | ICD-10-CM | POA: Diagnosis present

## 2021-08-25 DIAGNOSIS — K2289 Other specified disease of esophagus: Secondary | ICD-10-CM | POA: Diagnosis not present

## 2021-08-25 HISTORY — PX: COLONOSCOPY WITH PROPOFOL: SHX5780

## 2021-08-25 HISTORY — PX: BIOPSY: SHX5522

## 2021-08-25 HISTORY — PX: ESOPHAGOGASTRODUODENOSCOPY (EGD) WITH PROPOFOL: SHX5813

## 2021-08-25 LAB — CBC
HCT: 24.8 % — ABNORMAL LOW (ref 36.0–46.0)
Hemoglobin: 6.9 g/dL — CL (ref 12.0–15.0)
MCH: 18.2 pg — ABNORMAL LOW (ref 26.0–34.0)
MCHC: 27.8 g/dL — ABNORMAL LOW (ref 30.0–36.0)
MCV: 65.4 fL — ABNORMAL LOW (ref 80.0–100.0)
Platelets: 158 10*3/uL (ref 150–400)
RBC: 3.79 MIL/uL — ABNORMAL LOW (ref 3.87–5.11)
RDW: 20.7 % — ABNORMAL HIGH (ref 11.5–15.5)
WBC: 7.8 10*3/uL (ref 4.0–10.5)
nRBC: 0 % (ref 0.0–0.2)

## 2021-08-25 LAB — COMPREHENSIVE METABOLIC PANEL
ALT: 8 U/L (ref 0–44)
AST: 12 U/L — ABNORMAL LOW (ref 15–41)
Albumin: 3.4 g/dL — ABNORMAL LOW (ref 3.5–5.0)
Alkaline Phosphatase: 33 U/L — ABNORMAL LOW (ref 38–126)
Anion gap: 5 (ref 5–15)
BUN: 8 mg/dL (ref 6–20)
CO2: 22 mmol/L (ref 22–32)
Calcium: 8.8 mg/dL — ABNORMAL LOW (ref 8.9–10.3)
Chloride: 117 mmol/L — ABNORMAL HIGH (ref 98–111)
Creatinine, Ser: 0.6 mg/dL (ref 0.44–1.00)
GFR, Estimated: >60 mL/min (ref >60)
Glucose, Bld: 94 mg/dL (ref 70–99)
Potassium: 3.7 mmol/L (ref 3.5–5.1)
Sodium: 144 mmol/L (ref 135–145)
Total Bilirubin: 0.7 mg/dL (ref 0.3–1.2)
Total Protein: 6.3 g/dL — ABNORMAL LOW (ref 6.5–8.1)

## 2021-08-25 LAB — PROTIME-INR
INR: 1.3 — ABNORMAL HIGH (ref 0.8–1.2)
Prothrombin Time: 16.5 seconds — ABNORMAL HIGH (ref 11.4–15.2)

## 2021-08-25 LAB — APTT: aPTT: 38 seconds — ABNORMAL HIGH (ref 24–36)

## 2021-08-25 LAB — PREGNANCY, URINE: Preg Test, Ur: NEGATIVE

## 2021-08-25 LAB — HEMOGLOBIN AND HEMATOCRIT, BLOOD
HCT: 30.4 % — ABNORMAL LOW (ref 36.0–46.0)
Hemoglobin: 9 g/dL — ABNORMAL LOW (ref 12.0–15.0)

## 2021-08-25 LAB — PREPARE RBC (CROSSMATCH)

## 2021-08-25 SURGERY — COLONOSCOPY WITH PROPOFOL
Anesthesia: Monitor Anesthesia Care

## 2021-08-25 MED ORDER — FERROUS SULFATE 325 (65 FE) MG PO TABS
325.0000 mg | ORAL_TABLET | Freq: Every day | ORAL | 3 refills | Status: DC
Start: 2021-08-25 — End: 2023-03-15

## 2021-08-25 MED ORDER — LACTATED RINGERS IV SOLN: INTRAVENOUS | Status: DC

## 2021-08-25 MED ORDER — PROPOFOL 500 MG/50ML IV EMUL
INTRAVENOUS | Status: DC | PRN
  Administered 2021-08-25: 30 mg via INTRAVENOUS
  Administered 2021-08-25 (×2): 20 mg via INTRAVENOUS
  Administered 2021-08-25: 30 mg via INTRAVENOUS
  Administered 2021-08-25: 140 ug/kg/min via INTRAVENOUS

## 2021-08-25 MED ORDER — BOOST / RESOURCE BREEZE PO LIQD CUSTOM
1.0000 | Freq: Two times a day (BID) | ORAL | Status: DC
  Administered 2021-08-25: 1 via ORAL
  Filled 2021-08-25 (×3): qty 1

## 2021-08-25 MED ORDER — ENSURE ENLIVE PO LIQD
237.0000 mL | Freq: Two times a day (BID) | ORAL | Status: DC
  Filled 2021-08-25 (×3): qty 237

## 2021-08-25 MED ORDER — IOHEXOL 9 MG/ML PO SOLN
500.0000 mL | ORAL | Status: AC
  Administered 2021-08-25 (×2): 500 mL via ORAL

## 2021-08-25 MED ORDER — SODIUM CHLORIDE 0.9% IV SOLUTION: Freq: Once | INTRAVENOUS | Status: AC

## 2021-08-25 MED ORDER — ONDANSETRON HCL 4 MG/2ML IJ SOLN
INTRAMUSCULAR | Status: DC | PRN
  Administered 2021-08-25: 4 mg via INTRAVENOUS

## 2021-08-25 MED ORDER — PROPOFOL 1000 MG/100ML IV EMUL
INTRAVENOUS | Status: AC
  Filled 2021-08-25: qty 100

## 2021-08-25 MED ORDER — ADULT MULTIVITAMIN W/MINERALS CH
1.0000 | ORAL_TABLET | Freq: Every day | ORAL | Status: DC
Start: 2021-08-26 — End: 2021-08-26
  Filled 2021-08-25: qty 1

## 2021-08-25 MED ORDER — OMEPRAZOLE MAGNESIUM 20 MG PO TBEC: 20.0000 mg | DELAYED_RELEASE_TABLET | Freq: Two times a day (BID) | ORAL | 2 refills | Status: DC

## 2021-08-25 MED ORDER — IOHEXOL 300 MG/ML  SOLN
80.0000 mL | Freq: Once | INTRAMUSCULAR | Status: AC | PRN
  Administered 2021-08-25: 80 mL via INTRAVENOUS

## 2021-08-25 SURGICAL SUPPLY — 25 items

## 2021-08-25 NOTE — Progress Notes (Signed)
PROGRESS NOTE    Kimberly Gibson  ZOX:[id number] DOB: March 10, 1995 DOA: 08/24/2021 PCP: Janyth Contes, MD    Brief Narrative:  26 year old with history of chronic diagnoses anemia, GERD who was seen at GI office yesterday for EGD due to significant weight loss and iron deficiency anemia, however found to have low hemoglobin so sent to hospital for admission and inpatient procedure. Patient has history of menorrhagia, reports fatigue and weakness all the time.   Assessment & Plan:   Acute on chronic iron-deficiency anemia, likely secondary to menorrhagia. Hemoglobin 6.6-monitor PRBC-7.3-6.9-1 more unit of PRBC transfusion.  We will check posttransfusion H&H. Received 1 dose of iron sucrose.  We will repeat before discharge.  Remains on PPI, however active GI bleeding is less likely. Significant weight loss and reported dysphagia with history of GERD, will benefit with endoluminal evaluation with EGD and colonoscopy that is scheduled for today. If EGD and colonoscopy unrevealing, will check CT scan of the abdomen pelvis to rule out any extraluminal mass or tumors. See below, menorrhagia likely contributing for most of the symptoms.  Menorrhagia: This is likely primary problem.  Discussed with patient about follow-up with her gynecology oncology.  She will schedule a follow-up.  She will benefit with hormonal therapy, however she is going to discuss this with her gynecology. Patient is agreeable for further iron transfusion also be agreeable to take over-the-counter iron supplements with flavors.  Hypokalemia, replaced.  Adequate.   DVT prophylaxis: SCDs Start: 08/24/21 1622   Code Status: Full code Family Communication: None Disposition Plan: Status is: Observation The patient will require care spanning > 2 midnights and should be moved to inpatient because: Inpatient procedures planned.  Receiving blood transfusions.     Consultants:  Gastroenterology  Procedures:  None,  EGD and colonoscopy planned  Antimicrobials:  None   Subjective: Patient seen and examined.  Receiving blood transfusion.  Denies any new complaints.  She is having heavy menstrual bleeding.  Prepare for procedure.  Objective: Vitals:   08/25/21 0414 08/25/21 0630 08/25/21 0654 08/25/21 0937  BP: 105/65 (!) 101/55 106/64 110/71  Pulse: 68 (!) 57 72 66  Resp: '20 16 18   '$ Temp: 97.7 F (36.5 C) 97.9 F (36.6 C) 98.4 F (36.9 C) 98.6 F (37 C)  TempSrc: Oral Oral Oral Oral  SpO2: 100% 100% 100% 100%  Weight:      Height:        Intake/Output Summary (Last 24 hours) at 08/25/2021 1114 Last data filed at 08/25/2021 0938 Gross per 24 hour  Intake 711 ml  Output --  Net 711 ml   Filed Weights   08/25/21 0004  Weight: 46.3 kg    Examination:  General exam: Appears calm and comfortable.  Pale. Respiratory system: No added sounds. Cardiovascular system: S1 & S2 heard, RRR.  No edema. Gastrointestinal system: Soft.  Nontender.  Bowel sound present. Central nervous system: Alert and oriented. No focal neurological deficits. Extremities: Symmetric 5 x 5 power. Skin: No rashes, lesions or ulcers Psychiatry: Judgement and insight appear normal. Mood & affect appropriate.     Data Reviewed: I have personally reviewed following labs and imaging studies  CBC: Recent Labs  Lab 08/18/21 1210 08/23/21 1619 08/24/21 0905 08/24/21 1546 08/25/21 0511  WBC 5.3 6.4 6.0 6.2 7.8  NEUTROABS 2.7 3.4 3.8  --   --   HGB 6.6* 6.9 Repeated and verified X2.* 6.8* 7.3* 6.9*  HCT 25.1* 25.0 Repeated and verified X2.* 25.1* 26.5* 24.8*  MCV  63.1* 60.1 Repeated and verified X2.* 64.4* 65.4* 65.4*  PLT 298 189.0 186 162 161   Basic Metabolic Panel: Recent Labs  Lab 08/18/21 1210 08/24/21 0905 08/25/21 0511  NA 136 141 144  K 3.4* 3.2* 3.7  CL 105 109 117*  CO2 '22 25 22  '$ GLUCOSE 89 110* 94  BUN '8 11 8  '$ CREATININE 0.47 0.60 0.60  CALCIUM 9.2 9.2 8.8*   GFR: Estimated Creatinine  Clearance: 77.9 mL/min (by C-G formula based on SCr of 0.6 mg/dL). Liver Function Tests: Recent Labs  Lab 08/18/21 1210 08/24/21 0905 08/25/21 0511  AST 16 13* 12*  ALT '11 8 8  '$ ALKPHOS 40 36* 33*  BILITOT 0.5 0.3 0.7  PROT 7.8 7.5 6.3*  ALBUMIN 4.1 4.0 3.4*   No results for input(s): "LIPASE", "AMYLASE" in the last 168 hours. No results for input(s): "AMMONIA" in the last 168 hours. Coagulation Profile: Recent Labs  Lab 08/25/21 0511  INR 1.3*   Cardiac Enzymes: No results for input(s): "CKTOTAL", "CKMB", "CKMBINDEX", "TROPONINI" in the last 168 hours. BNP (last 3 results) No results for input(s): "PROBNP" in the last 8760 hours. HbA1C: No results for input(s): "HGBA1C" in the last 72 hours. CBG: No results for input(s): "GLUCAP" in the last 168 hours. Lipid Profile: No results for input(s): "CHOL", "HDL", "LDLCALC", "TRIG", "CHOLHDL", "LDLDIRECT" in the last 72 hours. Thyroid Function Tests: No results for input(s): "TSH", "T4TOTAL", "FREET4", "T3FREE", "THYROIDAB" in the last 72 hours. Anemia Panel: Recent Labs    08/23/21 1619  VITAMINB12 420  FOLATE 14.4  FERRITIN 1.6*  TIBC 554.4*  IRON 8*   Sepsis Labs: No results for input(s): "PROCALCITON", "LATICACIDVEN" in the last 168 hours.  No results found for this or any previous visit (from the past 240 hour(s)).       Radiology Studies: No results found.      Scheduled Meds:  pantoprazole (PROTONIX) IV  40 mg Intravenous Q12H   Continuous Infusions:   LOS: 0 days    Time spent: 35 minutes    Barb Merino, MD [facility] Pager [phone]

## 2021-08-25 NOTE — Op Note (Signed)
[facility], [place] Patient Name: Kimberly Gibson Procedure Date: 08/25/2021 MRN: [id number] Attending MD: Mauri Pole , MD Date of Birth: 09-Mar-1995 CSN: [id number] Age: 26 Admit Type: Inpatient Procedure:                Upper GI endoscopy Indications:              Dysphagia, iron deficiency anemia Providers:                Mauri Pole, MD, Mikey College, RN,                            Gloris Ham, Technician Referring MD:              Medicines:                Monitored Anesthesia Care Complications:            No immediate complications. Estimated Blood Loss:     Estimated blood loss was minimal. Procedure:                Pre-Anesthesia Assessment:                           - Prior to the procedure, a History and Physical                            was performed, and patient medications and                            allergies were reviewed. The patient's tolerance of                            previous anesthesia was also reviewed. The risks                            and benefits of the procedure and the sedation                            options and risks were discussed with the patient.                            All questions were answered, and informed consent                            was obtained. Prior Anticoagulants: The patient has                            taken no previous anticoagulant or antiplatelet                            agents. ASA Grade Assessment: II - A patient with                            mild systemic disease. After reviewing the risks  and benefits, the patient was deemed in                            satisfactory condition to undergo the procedure.                           After obtaining informed consent, the endoscope was                            passed under direct vision. Throughout the                            procedure, the patient's blood pressure, pulse, and                             oxygen saturations were monitored continuously. The                            [id number]-H190 ([id number]) Olympus endoscope was introduced                            through the mouth, and advanced to the second part                            of duodenum. The upper GI endoscopy was                            accomplished without difficulty. The patient                            tolerated the procedure well. Scope In: Scope Out: Findings:      Mucosal changes including ringed esophagus, feline appearance,       longitudinal furrows, small-caliber esophagus and white plaques were       found in the entire esophagus. Esophageal findings were graded using the       Eosinophilic Esophagitis Endoscopic Reference Score (EoE-EREFS) as:       Edema Grade 1 Present (decreased clarity or absence of vascular       markings), Rings Grade 1 Mild (subtle circumferential ridges seen on       esophageal distension), Exudates Grade 1 Mild (scattered white lesions       involving less than 10 percent of the esophageal surface area) and       Furrows Grade 1 Present (vertical lines with or without visible depth).       Biopsies were obtained from the proximal and distal esophagus with cold       forceps for histology of suspected eosinophilic esophagitis.      The stomach was normal.      The cardia and gastric fundus were normal on retroflexion.      The first portion of the duodenum and second portion of the duodenum       were normal. Biopsies for histology were taken with a cold forceps for       evaluation of celiac disease. Impression:               - Esophageal mucosal changes suggestive  of                            eosinophilic esophagitis. Biopsied.                           - Normal stomach.                           - Normal first portion of the duodenum and second                            portion of the duodenum. Biopsied. Moderate Sedation:      Not Applicable - Patient had care per  Anesthesia. Recommendation:           - Patient has a contact number available for                            emergencies. The signs and symptoms of potential                            delayed complications were discussed with the                            patient. Return to normal activities tomorrow.                            Written discharge instructions were provided to the                            patient.                           - Resume previous diet.                           - Continue present medications.                           - Await pathology results.                           - Follow an antireflux regimen.                           - Use Prilosec (omeprazole) 40 mg PO BID for 3                            months.                           - Follow up with Dr.Cunnigham next available                            appointment                           - Follow up with  Gyn for menorrhagia                           - Ok to discharge patient home. Will defer CT abd &                            pelvis to outpatient/primary GI. No evidence of IBD                            based on endoscopic evaluation Procedure Code(s):        --- Professional ---                           [id number], Esophagogastroduodenoscopy, flexible,                            transoral; with biopsy, single or multiple Diagnosis Code(s):        --- Professional ---                           K22.8, Other specified diseases of esophagus                           R13.10, Dysphagia, unspecified CPT copyright 2019 American Medical Association. All rights reserved. The codes documented in this report are preliminary and upon coder review may  be revised to meet current compliance requirements. Mauri Pole, MD 08/25/2021 2:21:52 PM This report has been signed electronically. Number of Addenda: 0

## 2021-08-25 NOTE — Progress Notes (Signed)
Date and time results received: 08/25/21 at 0555    Critical Value: Hgb = 6.9  Name of Provider Notified: NP Clarene Essex. At the same time above.  Waiting for new order.

## 2021-08-25 NOTE — Anesthesia Postprocedure Evaluation (Signed)
Anesthesia Post Note  Patient: JESSALYN HINOJOSA  Procedure(s) Performed: COLONOSCOPY WITH PROPOFOL ESOPHAGOGASTRODUODENOSCOPY (EGD) WITH PROPOFOL     Patient location during evaluation: Endoscopy Anesthesia Type: MAC Level of consciousness: awake and alert Pain management: pain level controlled Vital Signs Assessment: post-procedure vital signs reviewed and stable Respiratory status: spontaneous breathing, nonlabored ventilation, respiratory function stable and patient connected to nasal cannula oxygen Cardiovascular status: blood pressure returned to baseline and stable Postop Assessment: no apparent nausea or vomiting Anesthetic complications: no   No notable events documented.  Last Vitals:  Vitals:   08/25/21 1430 08/25/21 1440  BP: (!) 101/58 (!) 91/44  Pulse: (!) 51 61  Resp: 13 (!) 21  Temp:    SpO2: 100% 100%    Last Pain:  Vitals:   08/25/21 1440  TempSrc:   PainSc: 0-No pain                 Decorian Schuenemann DANIEL

## 2021-08-25 NOTE — Anesthesia Preprocedure Evaluation (Addendum)
Anesthesia Evaluation  Patient identified by MRN, date of birth, ID band Patient awake    Reviewed: Allergy & Precautions, NPO status , Patient's Chart, lab work & pertinent test results  History of Anesthesia Complications Negative for: history of anesthetic complications  Airway Mallampati: II  TM Distance: >3 FB Neck ROM: Full    Dental no notable dental hx.    Pulmonary Current Smoker and Patient abstained from smoking.,    Pulmonary exam normal        Cardiovascular Normal cardiovascular exam     Neuro/Psych negative neurological ROS     GI/Hepatic Neg liver ROS, GERD  ,  Endo/Other  negative endocrine ROS  Renal/GU negative Renal ROS     Musculoskeletal negative musculoskeletal ROS (+)   Abdominal   Peds  Hematology  (+) Blood dyscrasia, anemia ,   Anesthesia Other Findings   Reproductive/Obstetrics                            Anesthesia Physical Anesthesia Plan  ASA: 3  Anesthesia Plan: MAC   Post-op Pain Management:    Induction:   PONV Risk Score and Plan: 1  Airway Management Planned: Natural Airway  Additional Equipment:   Intra-op Plan:   Post-operative Plan:   Informed Consent: I have reviewed the patients History and Physical, chart, labs and discussed the procedure including the risks, benefits and alternatives for the proposed anesthesia with the patient or authorized representative who has indicated his/her understanding and acceptance.     Dental advisory given  Plan Discussed with: Anesthesiologist and CRNA  Anesthesia Plan Comments:        Anesthesia Quick Evaluation

## 2021-08-25 NOTE — Progress Notes (Signed)
Patient was advised to go to the emergency department for blood transfusion following results of these blood test.  She was subsequently admitted to [facility] and is undergoing upper and lower endoscopy today and will be referred to gynecology for further management of her menorrhagia, which is the suspected underlying etiology for her severe anemia

## 2021-08-25 NOTE — Progress Notes (Signed)
  Transition of Care [facility]) Screening Note   Patient Details  Name: Kimberly Gibson Date of Birth: [birth year]   Transition of Care [facility]) CM/SW Contact:    Vassie Moselle, LCSW Phone Number: 08/25/2021, 12:16 PM    Transition of Care Department [facility]) has reviewed patient and no TOC needs have been identified at this time. We will continue to monitor patient advancement through interdisciplinary progression rounds. If new patient transition needs arise, please place a TOC consult.

## 2021-08-25 NOTE — Transfer of Care (Signed)
Immediate Anesthesia Transfer of Care Note  Patient: Kimberly Gibson  Procedure(s) Performed: COLONOSCOPY WITH PROPOFOL ESOPHAGOGASTRODUODENOSCOPY (EGD) WITH PROPOFOL  Patient Location: PACU and Endoscopy Unit  Anesthesia Type:MAC  Level of Consciousness: awake, alert , oriented and patient cooperative  Airway & Oxygen Therapy: Patient Spontanous Breathing and Patient connected to face mask oxygen  Post-op Assessment: Report given to RN, Post -op Vital signs reviewed and stable and Patient moving all extremities  Post vital signs: Reviewed and stable  Last Vitals:  Vitals Value Taken Time  BP    Temp    Pulse 72 08/25/21 1417  Resp 21 08/25/21 1417  SpO2 100 % 08/25/21 1417  Vitals shown include unvalidated device data.  Last Pain:  Vitals:   08/25/21 1306  TempSrc: Oral  PainSc: 0-No pain         Complications: No notable events documented.

## 2021-08-25 NOTE — Discharge Instructions (Signed)
[facility] Stay Proper nutrition can help your body recover from illness and injury.   Foods and beverages high in protein, vitamins, and minerals help rebuild muscle loss, promote healing, & reduce fall risk.   In addition to eating healthy foods, a nutrition shake is an easy, delicious way to get the nutrition you need during and after your hospital stay  It is recommended that you continue to drink 2 bottles per day of: Ensure Plus or Ensure Clear for at least 1 month (30 days) after your hospital stay   Tips for adding a nutrition shake into your routine: As allowed, drink one with vitamins or medications instead of water or juice Enjoy one as a tasty mid-morning or afternoon snack Drink cold or make a milkshake out of it Drink one instead of milk with cereal or snacks Use as a coffee creamer   Available at the following grocery stores and pharmacies:           * [employer] [phone]            For COUPONS visit: www.ensure.com/join or http://dawson-may.com/   Suggested Substitutions Ensure Plus = Boost Plus = Carnation Breakfast Essentials = Boost Compact Ensure Active Clear = Boost Breeze Glucerna Shake = Boost Glucose Control = Carnation Breakfast Essentials SUGAR FREE

## 2021-08-25 NOTE — Plan of Care (Signed)

## 2021-08-25 NOTE — Interval H&P Note (Signed)
History and Physical Interval Note:  08/25/2021 1:04 PM  Kimberly Gibson  has presented today for surgery, with the diagnosis of iron deficiency anemia, intermittent dysphagia and odynophagia.  The various methods of treatment have been discussed with the patient and family. After consideration of risks, benefits and other options for treatment, the patient has consented to  Procedure(s): COLONOSCOPY WITH PROPOFOL (N/A) ESOPHAGOGASTRODUODENOSCOPY (EGD) WITH PROPOFOL (N/A) as a surgical intervention.  The patient's history has been reviewed, patient examined, no change in status, stable for surgery.  I have reviewed the patient's chart and labs.  Questions were answered to the patient's satisfaction.     Lamae Fosco

## 2021-08-25 NOTE — Op Note (Signed)
[facility] Patient Name: Kimberly Gibson Procedure Date: 08/25/2021 MRN: [id number] Attending MD: Mauri Pole , MD Date of Birth: 1995/06/09 CSN: [id number] Age: 26 Admit Type: Inpatient Procedure:                Colonoscopy Indications:              Unexplained iron deficiency anemia Providers:                Mauri Pole, MD, Mikey College, RN,                            Gloris Ham, Technician Referring MD:              Medicines:                Monitored Anesthesia Care Complications:            No immediate complications. Estimated Blood Loss:     Estimated blood loss was minimal. Procedure:                Pre-Anesthesia Assessment:                           - Prior to the procedure, a History and Physical                            was performed, and patient medications and                            allergies were reviewed. The patient's tolerance of                            previous anesthesia was also reviewed. The risks                            and benefits of the procedure and the sedation                            options and risks were discussed with the patient.                            All questions were answered, and informed consent                            was obtained. Prior Anticoagulants: The patient has                            taken no previous anticoagulant or antiplatelet                            agents. ASA Grade Assessment: II - A patient with                            mild systemic disease. After reviewing the risks  and benefits, the patient was deemed in                            satisfactory condition to undergo the procedure.                           After obtaining informed consent, the colonoscope                            was passed under direct vision. Throughout the                            procedure, the patient's blood pressure, pulse, and                             oxygen saturations were monitored continuously. The                            [id number]-HQ190L ([id number] Olympus colonoscope was                            introduced through the anus and advanced to the the                            terminal ileum, with identification of the                            appendiceal orifice and IC valve. The colonoscopy                            was performed without difficulty. The patient                            tolerated the procedure well. The quality of the                            bowel preparation was good. The terminal ileum,                            ileocecal valve, appendiceal orifice, and rectum                            were photographed. Scope In: 1:57:44 PM Scope Out: 2:09:10 PM Scope Withdrawal Time: 0 hours 7 minutes 44 seconds  Total Procedure Duration: 0 hours 11 minutes 26 seconds  Findings:      The perianal and digital rectal examinations were normal.      Non-bleeding external and internal hemorrhoids were found during       retroflexion. The hemorrhoids were medium-sized.      The entire examined colon appeared otherwise normal. Unable to retroflex       in rectum due to narrow rectal vault      The terminal ileum appeared normal. Impression:               - Non-bleeding external and internal hemorrhoids.                           -  The entire examined colon is normal.                           - The examined portion of the ileum was normal.                           - No specimens collected. Moderate Sedation:      N/A Recommendation:           - Patient has a contact number available for                            emergencies. The signs and symptoms of potential                            delayed complications were discussed with the                            patient. Return to normal activities tomorrow.                            Written discharge instructions were provided to the                            patient.                            - Resume previous diet.                           - Continue present medications.                           - Repeat colonoscopy at age 28 for screening                            purposes. Procedure Code(s):        --- Professional ---                           [id number], Colonoscopy, flexible; diagnostic, including                            collection of specimen(s) by brushing or washing,                            when performed (separate procedure) Diagnosis Code(s):        --- Professional ---                           K64.8, Other hemorrhoids                           D50.9, Iron deficiency anemia, unspecified CPT copyright 2019 American Medical Association. All rights reserved. The codes documented in this report are preliminary and upon coder review may  be revised to meet current compliance requirements. Mauri Pole, MD 08/25/2021 2:15:40 PM  This report has been signed electronically. Number of Addenda: 0

## 2021-08-25 NOTE — Progress Notes (Signed)
Initial Nutrition Assessment  DOCUMENTATION CODES:   Underweight  INTERVENTION:  - will order Boost Breeze po TID, each supplement provides 250 kcal and 9 grams of protein.  - will order Ensure Plus High Protein po BID, each supplement provides 350 kcal and 20 grams of protein.  - will order 1 tablet multivitamin with minerals/day.  - complete NFPE when feasible.  - will enter smart phrase in AVS for patient to drink 2 bottles of oral nutrition supplement for at least 1 month after d/c.  - Iron Content of Foods List from the Academy of [employer] left on bedside table with a note written at the top of the first page for goal of 18 mg/day of iron.    NUTRITION DIAGNOSIS:   Inadequate oral intake related to inability to eat as evidenced by NPO status.  GOAL:   Patient will meet greater than or equal to 90% of their needs  MONITOR:   Diet advancement, PO intake, Supplement acceptance, Labs, Weight trends  REASON FOR ASSESSMENT:   Malnutrition Screening Tool, Consult Diet education  ASSESSMENT:   26 y.o. female with medical history of anemia, GERD, and enlarged pituitary gland. She presented to the ED due to need for transfusion d/t low hemoglobin prior to planned EGD. She reported fatigue, weakness, shortness of breath with exertion, heavy menses with most recent period starting 5 days ago (typically lasts 6-7 days; she typically goes through 3 boxes of tampons during a period). She reported esophageal pain with eating and that she has lost 30 lb in the past several months.  Patient was out of the room at the time of RD visit. No visitors present in the room at that time. Handout left on bedside table.   Her diet was advanced to FLD yesterday at 1622 and then returned to NPO at midnight; no intakes documented.  She has not been seen by a  RD at any time in the past.  Notes indicates plan for EGD, colonoscopy, and possibly CT abdomen/pelvis  today.  Weight today is 102 lb and weight on 12/09/20 was 108 lb. This indicates 6 lb weight loss (5.5% body weight) in the past 7 months.   Labs reviewed; Cl: 117 mmol/l. Medications reviewed; 40 mEq Klor-Con x1 dose 6/28, 40 mg IV protonix BID.    NUTRITION - FOCUSED PHYSICAL EXAM:  Patient out of the room.  Diet Order:   Diet Order             Diet NPO time specified  Diet effective now                   EDUCATION NEEDS:   Education needs have been addressed  Skin:  Skin Assessment: Reviewed RN Assessment  Last BM:  PTA/unknown  Height:   Ht Readings from Last 1 Encounters:  08/25/21 '5\' 5"'$  (1.651 m)    Weight:   Wt Readings from Last 1 Encounters:  08/25/21 46.3 kg     BMI:  Body mass index is 16.99 kg/m.  Estimated Nutritional Needs:  Kcal:  1600-1900 kcal Protein:  80-95 grams Fluid:  >/= 2 L/day      Jarome Matin, MS, RD, LDN, CNSC Registered Dietitian II Inpatient Clinical Nutrition RD pager # and on-call/weekend pager # available in [facility]

## 2021-08-26 ENCOUNTER — Encounter (HOSPITAL_COMMUNITY): Payer: Self-pay | Admitting: Gastroenterology

## 2021-08-26 ENCOUNTER — Encounter (HOSPITAL_COMMUNITY): Payer: Self-pay

## 2021-08-26 ENCOUNTER — Telehealth: Payer: Self-pay | Admitting: Hematology and Oncology

## 2021-08-26 DIAGNOSIS — D500 Iron deficiency anemia secondary to blood loss (chronic): Secondary | ICD-10-CM | POA: Diagnosis not present

## 2021-08-26 DIAGNOSIS — K2100 Gastro-esophageal reflux disease with esophagitis, without bleeding: Secondary | ICD-10-CM | POA: Diagnosis not present

## 2021-08-26 DIAGNOSIS — R1319 Other dysphagia: Secondary | ICD-10-CM

## 2021-08-26 DIAGNOSIS — N920 Excessive and frequent menstruation with regular cycle: Secondary | ICD-10-CM | POA: Diagnosis not present

## 2021-08-26 DIAGNOSIS — D649 Anemia, unspecified: Secondary | ICD-10-CM

## 2021-08-26 LAB — CBC WITH DIFFERENTIAL/PLATELET
Abs Immature Granulocytes: 0.03 10*3/uL (ref 0.00–0.07)
Basophils Absolute: 0.1 10*3/uL (ref 0.0–0.1)
Basophils Relative: 1 %
Eosinophils Absolute: 0.2 10*3/uL (ref 0.0–0.5)
Eosinophils Relative: 2 %
HCT: 29.1 % — ABNORMAL LOW (ref 36.0–46.0)
Hemoglobin: 8.5 g/dL — ABNORMAL LOW (ref 12.0–15.0)
Immature Granulocytes: 0 %
Lymphocytes Relative: 27 %
Lymphs Abs: 2.1 10*3/uL (ref 0.7–4.0)
MCH: 20.2 pg — ABNORMAL LOW (ref 26.0–34.0)
MCHC: 29.2 g/dL — ABNORMAL LOW (ref 30.0–36.0)
MCV: 69.1 fL — ABNORMAL LOW (ref 80.0–100.0)
Monocytes Absolute: 0.5 10*3/uL (ref 0.1–1.0)
Monocytes Relative: 7 %
Neutro Abs: 5 10*3/uL (ref 1.7–7.7)
Neutrophils Relative %: 63 %
Platelets: 160 10*3/uL (ref 150–400)
RBC: 4.21 MIL/uL (ref 3.87–5.11)
RDW: 24.4 % — ABNORMAL HIGH (ref 11.5–15.5)
WBC: 7.9 10*3/uL (ref 4.0–10.5)
nRBC: 0 % (ref 0.0–0.2)

## 2021-08-26 LAB — BPAM RBC
Blood Product Expiration Date: 202307182359
Blood Product Expiration Date: 202307182359
ISSUE DATE / TIME: 202306281053
ISSUE DATE / TIME: 202306290629
Unit Type and Rh: 6200
Unit Type and Rh: 6200

## 2021-08-26 LAB — TYPE AND SCREEN
ABO/RH(D): A POS
Antibody Screen: NEGATIVE
Unit division: 0
Unit division: 0

## 2021-08-26 NOTE — Progress Notes (Signed)
Went over discharge instructions w/ pt. Pt verbalized understanding.  

## 2021-08-26 NOTE — Discharge Summary (Signed)
Physician Discharge Summary  Kimberly Gibson YIR:[id number] DOB: 03-Nov-1995 DOA: 08/24/2021  PCP: Janyth Contes, MD  Admit date: 08/24/2021 Discharge date: 08/26/2021  Admitted From: Home Disposition: Home  Recommendations for Outpatient Follow-up:  Follow up with PCP in 1-2 weeks Please obtain CBC in 1 week Referral made to hematology, she will benefit with iron infusions. Patient will schedule follow-up with her OB/GYN to treat her menorrhagia.  Home Health: N/A Equipment/Devices: N/A  Discharge Condition: Stable CODE STATUS: Full code Diet recommendation: Regular diet  Discharge summary: 26 year old with history of chronic iron deficiency anemia, GERD who was seen at GI office for EGD due to significant weight loss and iron deficiency anemia, however found to have low hemoglobin so sent to hospital for admission and inpatient procedure. Patient has history of menorrhagia, reports fatigue and weakness all the time.   - Acute on chronic iron-deficiency anemia, likely secondary to menorrhagia. Hemoglobin 6.6 Received total 2 units of PRBC transfusion with appropriate response, hemoglobin 8.5 today. Received 1 dose of iron sucrose.  Complained of dysphagia and weight loss, underwent EGD and colonoscopy. 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 Colonoscopy with no significant findings, nonbleeding internal and external hemorrhoids. CT scan of the abdomen pelvis with contrast was without any significant findings. As per GI recommendations, discharging home on omeprazole twice daily.   Menorrhagia: This is likely primary problem.  Discussed with patient about follow-up with her gynecology oncology.  She will schedule a follow-up.  She will benefit with hormonal therapy, however she is going to discuss this with her gynecology. Patient is agreeable for further iron transfusion  also be agreeable to take over-the-counter iron supplements with flavors.  We will send her for referral to infusion center/hematology to set up with iron transfusions.   Hypokalemia, replaced.  Adequate.  Asymptomatic.  Stable for discharge.     Discharge Diagnoses:  Principal Problem:   Anemia Active Problems:   GERD (gastroesophageal reflux disease)   Menorrhagia   Symptomatic anemia    Discharge Instructions  Discharge Instructions     Ambulatory referral to Hematology / Oncology   Complete by: As directed    Diet - low sodium heart healthy   Complete by: As directed    Diet general   Complete by: As directed    Discharge instructions   Complete by: As directed    Call and schedule follow up with your ObGyn regarding your heavy menstrual bleeding.   Increase activity slowly   Complete by: As directed    Increase activity slowly   Complete by: As directed       Allergies as of 08/26/2021   No Known Allergies      Medication List     STOP taking these medications    famotidine 40 MG/5ML suspension Commonly known as: PEPCID   sucralfate 1 GM/10ML suspension Commonly known as: Carafate       TAKE these medications    ferrous sulfate 325 (65 FE) MG tablet Take 1 tablet (325 mg total) by mouth daily.   omeprazole 20 MG tablet Commonly known as: PriLOSEC OTC Take 1 tablet (20 mg total) by mouth 2 (two) times daily.        No Known Allergies  Consultations: Gastroenterology   Procedures/Studies: CT ABDOMEN PELVIS W CONTRAST  Result Date: 08/25/2021 CLINICAL DATA:  Unintentional weight loss, iron deficiency anemia, occult malignancy EXAM: CT ABDOMEN AND PELVIS WITH CONTRAST  TECHNIQUE: Multidetector CT imaging of the abdomen and pelvis was performed using the standard protocol following bolus administration of intravenous contrast. RADIATION DOSE REDUCTION: This exam was performed according to the departmental dose-optimization program which  includes automated exposure control, adjustment of the mA and/or kV according to patient size and/or use of iterative reconstruction technique. CONTRAST:  64m OMNIPAQUE IOHEXOL 300 MG/ML  SOLN COMPARISON:  None Available. FINDINGS: Lower chest: No acute abnormality. Hepatobiliary: Mild periportal edema is present, nonspecific. This can be seen in the setting of cardiogenic failure, hypoproteinemia, inflammatory conditions of the liver or biliary tree, or aggressive rehydration. No intra or extrahepatic biliary ductal dilation. Liver otherwise unremarkable. Gallbladder is unremarkable. Pancreas: Unremarkable Spleen: Unremarkable Adrenals/Urinary Tract: Adrenal glands are unremarkable. Kidneys are normal, without renal calculi, focal lesion, or hydronephrosis. Bladder is unremarkable. Stomach/Bowel: Stomach, small bowel, and large bowel are unremarkable. Appendix normal. No free intraperitoneal gas. Mild free fluid within the pelvis is nonspecific in a female patient of this age. Vascular/Lymphatic: No significant vascular findings are present. No enlarged abdominal or pelvic lymph nodes. Reproductive: Uterus and bilateral adnexa are unremarkable. Other: No abdominal wall hernia Musculoskeletal: No acute or significant osseous findings. IMPRESSION: 1. Mild periportal edema, nonspecific. This can be seen in the setting of cardiogenic failure, hypoproteinemia, inflammatory conditions of the liver or biliary tree, or aggressive rehydration. Correlation with liver enzymes may be helpful for further management. 2. Mild free fluid within the pelvis, nonspecific in a female patient of this age. Electronically Signed   By: AFidela SalisburyM.D.   On: 08/25/2021 21:22   (Echo, Carotid, EGD, Colonoscopy, ERCP)    Subjective: Patient seen and examined.  No overnight events.  Able to tolerate diet.   Discharge Exam: Vitals:   08/25/21 2131 08/26/21 0500  BP: 105/66 107/66  Pulse: (!) 54 69  Resp: 18 20  Temp: 98.3 F  (36.8 C) 98 F (36.7 C)  SpO2: 100% 100%   Vitals:   08/25/21 1440 08/25/21 1459 08/25/21 2131 08/26/21 0500  BP: (!) 91/44 105/86 105/66 107/66  Pulse: 61 (!) 49 (!) 54 69  Resp: (!) '21 16 18 20  '$ Temp:  98.6 F (37 C) 98.3 F (36.8 C) 98 F (36.7 C)  TempSrc:  Oral Oral Oral  SpO2: 100% 100% 100% 100%  Weight:      Height:        General: Pt is alert, awake, not in acute distress Cardiovascular: RRR, S1/S2 +, no rubs, no gallops Respiratory: CTA bilaterally, no wheezing, no rhonchi Abdominal: Soft, NT, ND, bowel sounds + Extremities: no edema, no cyanosis    The results of significant diagnostics from this hospitalization (including imaging, microbiology, ancillary and laboratory) are listed below for reference.     Microbiology: No results found for this or any previous visit (from the past 240 hour(s)).   Labs: BNP (last 3 results) No results for input(s): "BNP" in the last 8760 hours. Basic Metabolic Panel: Recent Labs  Lab 08/24/21 0905 08/25/21 0511  NA 141 144  K 3.2* 3.7  CL 109 117*  CO2 25 22  GLUCOSE 110* 94  BUN 11 8  CREATININE 0.60 0.60  CALCIUM 9.2 8.8*   Liver Function Tests: Recent Labs  Lab 08/24/21 0905 08/25/21 0511  AST 13* 12*  ALT 8 8  ALKPHOS 36* 33*  BILITOT 0.3 0.7  PROT 7.5 6.3*  ALBUMIN 4.0 3.4*   No results for input(s): "LIPASE", "AMYLASE" in the last 168 hours. No results for  input(s): "AMMONIA" in the last 168 hours. CBC: Recent Labs  Lab 08/23/21 1619 08/24/21 0905 08/24/21 1546 08/25/21 0511 08/25/21 1134 08/26/21 0540  WBC 6.4 6.0 6.2 7.8  --  7.9  NEUTROABS 3.4 3.8  --   --   --  PENDING  HGB 6.9 Repeated and verified X2.* 6.8* 7.3* 6.9* 9.0* 8.5*  HCT 25.0 Repeated and verified X2.* 25.1* 26.5* 24.8* 30.4* 29.1*  MCV 60.1 Repeated and verified X2.* 64.4* 65.4* 65.4*  --  69.1*  PLT 189.0 186 162 158  --  160   Cardiac Enzymes: No results for input(s): "CKTOTAL", "CKMB", "CKMBINDEX", "TROPONINI" in  the last 168 hours. BNP: Invalid input(s): "POCBNP" CBG: No results for input(s): "GLUCAP" in the last 168 hours. D-Dimer No results for input(s): "DDIMER" in the last 72 hours. Hgb A1c No results for input(s): "HGBA1C" in the last 72 hours. Lipid Profile No results for input(s): "CHOL", "HDL", "LDLCALC", "TRIG", "CHOLHDL", "LDLDIRECT" in the last 72 hours. Thyroid function studies No results for input(s): "TSH", "T4TOTAL", "T3FREE", "THYROIDAB" in the last 72 hours.  Invalid input(s): "FREET3" Anemia work up Recent Labs    08/23/21 1619  VITAMINB12 420  FOLATE 14.4  FERRITIN 1.6*  TIBC 554.4*  IRON 8*   Urinalysis    Component Value Date/Time   COLORURINE YELLOW 12/09/2020 1342   APPEARANCEUR HAZY (A) 12/09/2020 1342   LABSPEC 1.020 08/18/2021 1205   PHURINE 7.0 08/18/2021 1205   GLUCOSEU NEGATIVE 08/18/2021 1205   HGBUR NEGATIVE 08/18/2021 1205   BILIRUBINUR SMALL (A) 08/18/2021 1205   KETONESUR NEGATIVE 08/18/2021 1205   PROTEINUR 30 (A) 08/18/2021 1205   UROBILINOGEN 1.0 08/18/2021 1205   NITRITE NEGATIVE 08/18/2021 1205   LEUKOCYTESUR NEGATIVE 08/18/2021 1205   Sepsis Labs Recent Labs  Lab 08/24/21 0905 08/24/21 1546 08/25/21 0511 08/26/21 0540  WBC 6.0 6.2 7.8 7.9   Microbiology No results found for this or any previous visit (from the past 240 hour(s)).   Time coordinating discharge: 35 minutes  SIGNED:   Barb Merino, MD  [facility] 08/26/2021, 7:12 AM

## 2021-08-26 NOTE — Telephone Encounter (Signed)
Scheduled appt per 6/30 referral. Pt is aware of appt date and time. Pt is aware to arrive 15 mins prior to appt time and to bring and updated insurance card. Pt is aware of appt location.   

## 2021-08-26 NOTE — Plan of Care (Signed)

## 2021-08-30 LAB — SURGICAL PATHOLOGY

## 2021-08-31 ENCOUNTER — Encounter: Payer: Self-pay | Admitting: Nurse Practitioner

## 2021-09-06 ENCOUNTER — Inpatient Hospital Stay: Payer: Medicaid Other

## 2021-09-06 ENCOUNTER — Inpatient Hospital Stay: Payer: Medicaid Other | Attending: Hematology and Oncology | Admitting: Hematology and Oncology

## 2021-09-06 NOTE — Progress Notes (Deleted)
[facility] CONSULT NOTE  Patient Care Team: Janyth Contes, MD as PCP - General (Obstetrics and Gynecology)  CHIEF COMPLAINTS/PURPOSE OF CONSULTATION:   Symptomatic anemia.  ASSESSMENT & PLAN:   This is a pleasant 26 year old female patient who was recently seen at the GI office for significant weight loss and iron deficiency anemia found to have low hemoglobin hence hospitalized, received 2 units of packed red blood cell transfusion with appropriate response, 1 dose of iron sucrose, underwent EGD and colonoscopy, EGD findings eosinophilic esophagitis started on PPI, colonoscopy with no significant findings.  CT scan of the abdomen pelvis with contrast without any significant findings, referred to hematology for consideration of management of iron deficiency anemia.  We have discussed the following findings about iron deficiency anemia today.  Iron deficiency anemia affects a large proportion of the wellness population especially females of childbearing age and children.  Common causes of iron deficiency include blood loss, reduced iron absorption because of previous surgeries, dietary restrictions or other malabsorption issues, medications that reduce gastric acidity are due to inherited disorders such as IRIDA due to TMPRSS6 mutation. Symptoms of iron deficiency usually include fatigue, pica, restless legs, exercise intolerance, exertional dyspnea, headaches and weakness. Diagnosis can be usually made by history, physical examination, CBC and iron studies.    We generally treat iron deficiency anemia with oral supplements if this can be tolerated.  IV iron is appropriate for patients are unable to tolerate due to gastrointestinal side effects or for patients with severe/ongoing blood loss, history of gastric bypass which reduces gastric acid and henceforth will impair intestinal absorption of oral iron, malabsorption syndromes are occasional in pregnancy.  In this patient, I  think IV iron is appropriate given the severity of anemia.  There are several formularies of intravenous iron available in the market.  We have discussed about risk of allergic/infusion reactions including potentially life-threatening anaphylaxis with intravenous iron however these serious allergic reactions are exceedingly rare and overestimated.  In contrast to serious allergic reactions, IV iron may be associated with nonallergic infusion reactions including self-limited urticaria, palpitations, dizziness, neck and back spasm which again occur in less than 1% of the individuals and do not progress to more serious reactions.    HISTORY OF PRESENTING ILLNESS:  Kimberly Gibson 26 y.o. female is here because of symptomatic anemia.  REVIEW OF SYSTEMS:   Constitutional: Denies fevers, chills or abnormal night sweats Eyes: Denies blurriness of vision, double vision or watery eyes Ears, nose, mouth, throat, and face: Denies mucositis or sore throat Respiratory: Denies cough, dyspnea or wheezes Cardiovascular: Denies palpitation, chest discomfort or lower extremity swelling Gastrointestinal:  Denies nausea, heartburn or change in bowel habits Skin: Denies abnormal skin rashes Lymphatics: Denies new lymphadenopathy or easy bruising Neurological:Denies numbness, tingling or new weaknesses Behavioral/Psych: Mood is stable, no new changes  All other systems were reviewed with the patient and are negative.  MEDICAL HISTORY:  Past Medical History:  Diagnosis Date   BV (bacterial vaginosis)    Enlarged pituitary gland (HCC)    per pt report   GERD (gastroesophageal reflux disease)    Microcytic anemia    Normal pregnancy in multigravida, antepartum 12/11/2017   SVD (spontaneous vaginal delivery) 12/12/2017    SURGICAL HISTORY: Past Surgical History:  Procedure Laterality Date   BIOPSY  08/25/2021   Procedure: BIOPSY;  Surgeon: Mauri Pole, MD;  Location: [facility];  Service:  Gastroenterology;;   CLOSED REDUCTION METACARPAL WITH PERCUTANEOUS PINNING Right 08/08/2019  Procedure: CLOSED REDUCTION WITH PERCUTANEOUS PINNING RIGHT SMALL METACARPAL BASE FRACTURE;  Surgeon: Leanora Cover, MD;  Location: [facility];  Service: Orthopedics;  Laterality: Right;   COLONOSCOPY WITH PROPOFOL N/A 08/25/2021   Procedure: COLONOSCOPY WITH PROPOFOL;  Surgeon: Mauri Pole, MD;  Location: [facility];  Service: Gastroenterology;  Laterality: N/A;   ESOPHAGOGASTRODUODENOSCOPY (EGD) WITH PROPOFOL N/A 08/25/2021   Procedure: ESOPHAGOGASTRODUODENOSCOPY (EGD) WITH PROPOFOL;  Surgeon: Mauri Pole, MD;  Location: [facility];  Service: Gastroenterology;  Laterality: N/A;   WISDOM TOOTH EXTRACTION      SOCIAL HISTORY: Social History   Socioeconomic History   Marital status: Single    Spouse name: Not on file   Number of children: Not on file   Years of education: Not on file   Highest education level: Not on file  Occupational History   Not on file  Tobacco Use   Smoking status: Some Days    Types: Cigarettes, Cigars   Smokeless tobacco: Never   Tobacco comments:    Black and off, on and off 2 years  Vaping Use   Vaping Use: Never used  Substance and Sexual Activity   Alcohol use: No   Drug use: No   Sexual activity: Yes    Birth control/protection: None  Other Topics Concern   Not on file  Social History Narrative   Not on file   Social Determinants of Health   Financial Resource Strain: Low Risk  (12/12/2017)   Overall Financial Resource Strain (CARDIA)    Difficulty of Paying Living Expenses: Not hard at all  Food Insecurity: No Food Insecurity (12/12/2017)   Hunger Vital Sign    Worried About Running Out of Food in the Last Year: Never true    Ran Out of Food in the Last Year: Never true  Transportation Needs: No Transportation Needs (12/12/2017)   PRAPARE - Hydrologist (Medical): No    Lack of Transportation  (Non-Medical): No  Physical Activity: Inactive (12/12/2017)   Exercise Vital Sign    Days of Exercise per Week: 0 days    Minutes of Exercise per Session: 0 min  Stress: No Stress Concern Present (12/12/2017)   [place]    Feeling of Stress : Only a little  Social Connections: Unknown (12/12/2017)   Social Connection and Isolation Panel [NHANES]    Frequency of Communication with Friends and Family: Not asked    Frequency of Social Gatherings with Friends and Family: Not on file    Attends Religious Services: Not on Advertising copywriter or Organizations: Not on file    Attends Archivist Meetings: Not on file    Marital Status: Not on file  Intimate Partner Violence: Not on file    FAMILY HISTORY: Family History  Problem Relation Age of Onset   Asthma Sister    Hearing loss Neg Hx    Colon cancer Neg Hx    Esophageal cancer Neg Hx    Stomach cancer Neg Hx     ALLERGIES:  has No Known Allergies.  MEDICATIONS:  Current Outpatient Medications  Medication Sig Dispense Refill   ferrous sulfate 325 (65 FE) MG tablet Take 1 tablet (325 mg total) by mouth daily. 90 tablet 3   omeprazole (PRILOSEC OTC) 20 MG tablet Take 1 tablet (20 mg total) by mouth 2 (two) times daily. 60 tablet 2   No current facility-administered medications  for this visit.     PHYSICAL EXAMINATION: ECOG PERFORMANCE STATUS: {CHL ONC ECOG PS:[phone]}  There were no vitals filed for this visit. There were no vitals filed for this visit.  GENERAL:alert, no distress and comfortable SKIN: skin color, texture, turgor are normal, no rashes or significant lesions EYES: normal, conjunctiva are pink and non-injected, sclera clear OROPHARYNX:no exudate, no erythema and lips, buccal mucosa, and tongue normal  NECK: supple, thyroid normal size, non-tender, without nodularity LYMPH:  no palpable lymphadenopathy in the  cervical, axillary or inguinal LUNGS: clear to auscultation and percussion with normal breathing effort HEART: regular rate & rhythm and no murmurs and no lower extremity edema ABDOMEN:abdomen soft, non-tender and normal bowel sounds Musculoskeletal:no cyanosis of digits and no clubbing  PSYCH: alert & oriented x 3 with fluent speech NEURO: no focal motor/sensory deficits  LABORATORY DATA:  I have reviewed the data as listed Lab Results  Component Value Date   WBC 7.9 08/26/2021   HGB 8.5 (L) 08/26/2021   HCT 29.1 (L) 08/26/2021   MCV 69.1 (L) 08/26/2021   PLT 160 08/26/2021     Chemistry      Component Value Date/Time   NA 144 08/25/2021 0511   K 3.7 08/25/2021 0511   CL 117 (H) 08/25/2021 0511   CO2 22 08/25/2021 0511   BUN 8 08/25/2021 0511   CREATININE 0.60 08/25/2021 0511      Component Value Date/Time   CALCIUM 8.8 (L) 08/25/2021 0511   ALKPHOS 33 (L) 08/25/2021 0511   AST 12 (L) 08/25/2021 0511   ALT 8 08/25/2021 0511   BILITOT 0.7 08/25/2021 0511     Labs  Pertinent labs include Hb of 8.5, microcytic hypochromic. WBC and platelets normal.  Endoscopy with candidal esophagitis, no other changes noted.  IBC and ferritin with serum iron level of 8, ferritin 1.6, TIBC 554.4 Folate 14.4, B12 normal 420.  RADIOGRAPHIC STUDIES: I have personally reviewed the radiological images as listed and agreed with the findings in the report. CT ABDOMEN PELVIS W CONTRAST  Result Date: 08/25/2021 CLINICAL DATA:  Unintentional weight loss, iron deficiency anemia, occult malignancy EXAM: CT ABDOMEN AND PELVIS WITH CONTRAST TECHNIQUE: Multidetector CT imaging of the abdomen and pelvis was performed using the standard protocol following bolus administration of intravenous contrast. RADIATION DOSE REDUCTION: This exam was performed according to the departmental dose-optimization program which includes automated exposure control, adjustment of the mA and/or kV according to patient size  and/or use of iterative reconstruction technique. CONTRAST:  64m OMNIPAQUE IOHEXOL 300 MG/ML  SOLN COMPARISON:  None Available. FINDINGS: Lower chest: No acute abnormality. Hepatobiliary: Mild periportal edema is present, nonspecific. This can be seen in the setting of cardiogenic failure, hypoproteinemia, inflammatory conditions of the liver or biliary tree, or aggressive rehydration. No intra or extrahepatic biliary ductal dilation. Liver otherwise unremarkable. Gallbladder is unremarkable. Pancreas: Unremarkable Spleen: Unremarkable Adrenals/Urinary Tract: Adrenal glands are unremarkable. Kidneys are normal, without renal calculi, focal lesion, or hydronephrosis. Bladder is unremarkable. Stomach/Bowel: Stomach, small bowel, and large bowel are unremarkable. Appendix normal. No free intraperitoneal gas. Mild free fluid within the pelvis is nonspecific in a female patient of this age. Vascular/Lymphatic: No significant vascular findings are present. No enlarged abdominal or pelvic lymph nodes. Reproductive: Uterus and bilateral adnexa are unremarkable. Other: No abdominal wall hernia Musculoskeletal: No acute or significant osseous findings. IMPRESSION: 1. Mild periportal edema, nonspecific. This can be seen in the setting of cardiogenic failure, hypoproteinemia, inflammatory conditions of the liver or biliary  tree, or aggressive rehydration. Correlation with liver enzymes may be helpful for further management. 2. Mild free fluid within the pelvis, nonspecific in a female patient of this age. Electronically Signed   By: Fidela Salisbury M.D.   On: 08/25/2021 21:22    All questions were answered. The patient knows to call the clinic with any problems, questions or concerns. I spent *** minutes in the care of this patient including H and P, review of records, counseling and coordination of care.     Benay Pike, MD 09/06/2021 8:35 AM

## 2021-09-07 ENCOUNTER — Other Ambulatory Visit: Payer: Self-pay

## 2021-09-07 MED ORDER — FLUCONAZOLE 100 MG PO TABS: 100.0000 mg | ORAL_TABLET | Freq: Every day | ORAL | 0 refills | Status: AC

## 2021-10-11 ENCOUNTER — Ambulatory Visit: Payer: Medicaid Other | Admitting: Gastroenterology

## 2022-02-10 ENCOUNTER — Encounter (HOSPITAL_COMMUNITY): Payer: Self-pay

## 2022-02-10 ENCOUNTER — Ambulatory Visit (HOSPITAL_COMMUNITY)
Admission: RE | Admit: 2022-02-10 | Discharge: 2022-02-10 | Disposition: A | Discharge status: Home / Self Care | Payer: Medicaid Other | Source: Ambulatory Visit | Attending: Physician Assistant | Admitting: Physician Assistant

## 2022-02-10 VITALS — BP 121/74 | HR 92 | Temp 98.5°F | Resp 18

## 2022-02-10 DIAGNOSIS — Z202 Contact with and (suspected) exposure to infections with a predominantly sexual mode of transmission: Secondary | ICD-10-CM | POA: Diagnosis not present

## 2022-02-10 DIAGNOSIS — Z113 Encounter for screening for infections with a predominantly sexual mode of transmission: Secondary | ICD-10-CM | POA: Diagnosis present

## 2022-02-10 DIAGNOSIS — N898 Other specified noninflammatory disorders of vagina: Secondary | ICD-10-CM

## 2022-02-10 LAB — POC URINE PREG, ED: Preg Test, Ur: NEGATIVE

## 2022-02-10 NOTE — ED Provider Notes (Signed)
[place]    CSN: [id number] Arrival date & time: 02/10/22  0818      History   Chief Complaint Chief Complaint  Patient presents with   Vaginal Discharge    I believe I have a yeast infection or BV from previous experience. But I also like too have a std panel as well . - Entered by patient    HPI Kimberly Gibson is a 26 y.o. female.   Patient here today for evaluation of vaginal discharge that started 3 days ago.  She states that discharge is milky in quality.  She does have a history of resistant describing that she has had a new sexual partner.  She denies any genital lesions or rashes.  She has not had any abdominal pain or back pain.  She denies any dysuria  The history is provided by the patient.  Vaginal Discharge Associated symptoms: no abdominal pain, no dysuria, no fever, no nausea and no vomiting     Past Medical History:  Diagnosis Date   BV (bacterial vaginosis)    Enlarged pituitary gland (HCC)    per pt report   GERD (gastroesophageal reflux disease)    Microcytic anemia    Normal pregnancy in multigravida, antepartum 12/11/2017   SVD (spontaneous vaginal delivery) 12/12/2017    Patient Active Problem List   Diagnosis Date Noted   Esophageal dysphagia    Menorrhagia 08/25/2021   Symptomatic anemia 08/25/2021   Anemia 08/24/2021   Normal pregnancy in multigravida in third trimester 12/12/2017   SVD (spontaneous vaginal delivery) 12/12/2017   Normal pregnancy in multigravida, antepartum 12/11/2017   No leakage of amniotic fluid into vagina 11/27/2017   Adjustment disorder with mixed anxiety and depressed mood 08/28/2015   GERD (gastroesophageal reflux disease) 08/27/2015   Overdose of iron or iron compound 08/27/2015   Microcytic anemia 08/27/2015   Normal pregnancy, first 09/18/2013    Past Surgical History:  Procedure Laterality Date   BIOPSY  08/25/2021   Procedure: BIOPSY;  Surgeon: Mauri Pole, MD;  Location: [facility];   Service: Gastroenterology;;   CLOSED REDUCTION METACARPAL WITH PERCUTANEOUS PINNING Right 08/08/2019   Procedure: CLOSED REDUCTION WITH PERCUTANEOUS PINNING RIGHT SMALL METACARPAL BASE FRACTURE;  Surgeon: Leanora Cover, MD;  Location: [facility];  Service: Orthopedics;  Laterality: Right;   COLONOSCOPY WITH PROPOFOL N/A 08/25/2021   Procedure: COLONOSCOPY WITH PROPOFOL;  Surgeon: Mauri Pole, MD;  Location: [facility];  Service: Gastroenterology;  Laterality: N/A;   ESOPHAGOGASTRODUODENOSCOPY (EGD) WITH PROPOFOL N/A 08/25/2021   Procedure: ESOPHAGOGASTRODUODENOSCOPY (EGD) WITH PROPOFOL;  Surgeon: Mauri Pole, MD;  [facility]: [facility];  Service: Gastroenterology;  Laterality: N/A;   WISDOM TOOTH EXTRACTION      OB History     Gravida  4   Para  2   Term  2   Preterm  0   AB  1   Living  2      SAB  1   IAB  0   Ectopic  0   Multiple  0   Live Births  2            Home Medications    Prior to Admission medications   Medication Sig Start Date End Date Taking? Authorizing Provider  ferrous sulfate 325 (65 FE) MG tablet Take 1 tablet (325 mg total) by mouth daily. 08/25/21 08/25/22  Barb Merino, MD  omeprazole (PRILOSEC OTC) 20 MG tablet Take 1 tablet (20 mg total) by mouth 2 (  two) times daily. 08/25/21 11/23/21  Barb Merino, MD    Family History Family History  Problem Relation Age of Onset   Asthma Sister    Hearing loss Neg Hx    Colon cancer Neg Hx    Esophageal cancer Neg Hx    Stomach cancer Neg Hx     Social History Social History   Tobacco Use   Smoking status: Some Days    Types: Cigarettes, Cigars   Smokeless tobacco: Never   Tobacco comments:    Black and off, on and off 2 years  Vaping Use   Vaping Use: Never used  Substance Use Topics   Alcohol use: No   Drug use: No     Allergies   Patient has no known allergies.   Review of Systems Review of Systems  Constitutional:  Negative for chills and fever.  Eyes:   Negative for discharge and redness.  Gastrointestinal:  Negative for abdominal pain, nausea and vomiting.  Genitourinary:  Positive for vaginal discharge. Negative for dysuria and genital sores.  Musculoskeletal:  Negative for back pain.     Physical Exam Triage Vital Signs ED Triage Vitals  Enc Vitals Group     BP      Pulse      Resp      Temp      Temp src      SpO2      Weight      Height      Head Circumference      Peak Flow      Pain Score      Pain Loc      Pain Edu?      Excl. in [place]?    No data found.  Updated Vital Signs BP 121/74 (BP Location: Left Arm)   Pulse 92   Temp 98.5 F (36.9 C) (Oral)   Resp 18   LMP 01/14/2022 (Exact Date)   SpO2 98%      Physical Exam Vitals and nursing note reviewed.  Constitutional:      General: She is not in acute distress.    Appearance: Normal appearance. She is not ill-appearing.  HENT:     Head: Normocephalic and atraumatic.  Eyes:     Conjunctiva/sclera: Conjunctivae normal.  Cardiovascular:     Rate and Rhythm: Normal rate.  Pulmonary:     Effort: Pulmonary effort is normal.  Neurological:     Mental Status: She is alert.  Psychiatric:        Mood and Affect: Mood normal.        Behavior: Behavior normal.        Thought Content: Thought content normal.      UC Treatments / Results  Labs (all labs ordered are listed, but only abnormal results are displayed) Labs Reviewed  POC URINE PREG, ED  CERVICOVAGINAL ANCILLARY ONLY    EKG   Radiology No results found.  Procedures Procedures (including critical care time)  Medications Ordered in UC Medications - No data to display  Initial Impression / Assessment and Plan / UC Course  I have reviewed the triage vital signs and the nursing notes.  Pertinent labs & imaging results that were available during my care of the patient were reviewed by me and considered in my medical decision making (see chart for details).    Screening ordered as  requested.  Recommendation blood work today due to the having recently performed same at [facility].  Encouraged follow up  with any further concerns following results.  Final Clinical Impressions(s) / UC Diagnoses   Final diagnoses:  Screening for STD (sexually transmitted disease)   Discharge Instructions   None    ED Prescriptions   None    PDMP not reviewed this encounter.   Francene Finders, PA-C 02/10/22 [phone]

## 2022-02-10 NOTE — ED Triage Notes (Signed)
Pt states that she has been having vaginal discharge x 3 days, She would like a STI screen. She has no known exposures.

## 2022-02-13 LAB — CERVICOVAGINAL ANCILLARY ONLY
Bacterial Vaginitis (gardnerella): POSITIVE — AB
Candida Glabrata: NEGATIVE
Candida Vaginitis: POSITIVE — AB
Chlamydia: NEGATIVE
Comment: NEGATIVE
Comment: NEGATIVE
Comment: NEGATIVE
Comment: NEGATIVE
Comment: NEGATIVE
Comment: NORMAL
Neisseria Gonorrhea: NEGATIVE
Trichomonas: NEGATIVE

## 2022-02-14 ENCOUNTER — Telehealth (HOSPITAL_COMMUNITY): Payer: Self-pay | Admitting: Emergency Medicine

## 2022-02-14 MED ORDER — FLUCONAZOLE 150 MG PO TABS: 150.0000 mg | ORAL_TABLET | Freq: Once | ORAL | 0 refills | Status: AC

## 2022-02-14 MED ORDER — METRONIDAZOLE 500 MG PO TABS: 500.0000 mg | ORAL_TABLET | Freq: Two times a day (BID) | ORAL | 0 refills | Status: DC

## 2022-05-17 ENCOUNTER — Ambulatory Visit (HOSPITAL_COMMUNITY): Payer: Medicaid Other

## 2022-06-27 ENCOUNTER — Ambulatory Visit (HOSPITAL_COMMUNITY)
Admission: RE | Admit: 2022-06-27 | Discharge: 2022-06-27 | Disposition: A | Discharge status: Home / Self Care | Payer: Medicaid Other | Source: Ambulatory Visit | Attending: Physician Assistant | Admitting: Physician Assistant

## 2022-06-27 ENCOUNTER — Encounter (HOSPITAL_COMMUNITY): Payer: Self-pay

## 2022-06-27 ENCOUNTER — Ambulatory Visit (HOSPITAL_COMMUNITY): Payer: Medicaid Other

## 2022-06-27 VITALS — BP 104/74 | HR 96 | Temp 97.8°F | Resp 16

## 2022-06-27 DIAGNOSIS — N760 Acute vaginitis: Secondary | ICD-10-CM

## 2022-06-27 DIAGNOSIS — Z113 Encounter for screening for infections with a predominantly sexual mode of transmission: Secondary | ICD-10-CM

## 2022-06-27 DIAGNOSIS — N898 Other specified noninflammatory disorders of vagina: Secondary | ICD-10-CM | POA: Diagnosis present

## 2022-06-27 LAB — HIV ANTIBODY (ROUTINE TESTING W REFLEX): HIV Screen 4th Generation wRfx: NONREACTIVE

## 2022-06-27 LAB — HEPATITIS C ANTIBODY: HCV Ab: NONREACTIVE

## 2022-06-27 MED ORDER — FLUCONAZOLE 150 MG PO TABS: 150.0000 mg | ORAL_TABLET | ORAL | 0 refills | Status: DC | PRN

## 2022-06-27 NOTE — ED Triage Notes (Signed)
Here for vaginal itching and discharge x 2 days. Pt would like STD-testing.

## 2022-06-27 NOTE — Discharge Instructions (Signed)
We are treating you for yeast.  Take fluconazole today and a second dose in 3 days if your symptoms or not resolved.  We will contact you if your swab or blood work is abnormal we will need to arrange additional treatment.  Abstain from sex until you receive your results.  Wear loosefitting cotton underwear and use hypoallergenic soaps and detergents.  If you have any worsening or changing symptoms including abdominal pain, pelvic pain, fever, nausea, vomiting you should be seen immediately.

## 2022-06-27 NOTE — ED Provider Notes (Signed)
[facility]    CSN: [id number] Arrival date & time: 06/27/22  1420      History   Chief Complaint Chief Complaint  Patient presents with   Vaginal Discharge    Had an appt for 10:30 but there is no where to park . - Entered by patient    HPI ROSARIA KUBIN is a 27 y.o. female.   Patient presents today with a 2-day history of vaginal irritation.  She reports associated thick white discharge without odor.  Denies any pelvic pain, abdominal pain, urinary frequency, urinary urgency, fever, nausea, vomiting.  She denies any changes to personal hygiene products including soaps or detergents.  Denies recent antibiotic use.  She was seen for similar symptoms December 2023 at which point he tested positive for BV and yeast.  Reports that she does follow with her OB/GYN has reported to them recurrent BV and yeast infections but is not currently on suppressive therapy.  She denies history of diabetes and does not take SGLT2 inhibitor.  She is confident that she is not pregnant.  She is requesting treatment for yeast infections today.  She is also interested in complete STI panel.    Past Medical History:  Diagnosis Date   BV (bacterial vaginosis)    Enlarged pituitary gland (HCC)    per pt report   GERD (gastroesophageal reflux disease)    Microcytic anemia    Normal pregnancy in multigravida, antepartum 12/11/2017   SVD (spontaneous vaginal delivery) 12/12/2017    Patient Active Problem List   Diagnosis Date Noted   Esophageal dysphagia    Menorrhagia 08/25/2021   Symptomatic anemia 08/25/2021   Anemia 08/24/2021   Normal pregnancy in multigravida in third trimester 12/12/2017   SVD (spontaneous vaginal delivery) 12/12/2017   Normal pregnancy in multigravida, antepartum 12/11/2017   No leakage of amniotic fluid into vagina 11/27/2017   Adjustment disorder with mixed anxiety and depressed mood 08/28/2015   GERD (gastroesophageal reflux disease) 08/27/2015   Overdose of  iron or iron compound 08/27/2015   Microcytic anemia 08/27/2015   Normal pregnancy, first 09/18/2013    Past Surgical History:  Procedure Laterality Date   BIOPSY  08/25/2021   Procedure: BIOPSY;  Surgeon: Napoleon Form, MD;  Location: [facility];  Service: Gastroenterology;;   CLOSED REDUCTION METACARPAL WITH PERCUTANEOUS PINNING Right 08/08/2019   Procedure: CLOSED REDUCTION WITH PERCUTANEOUS PINNING RIGHT SMALL METACARPAL BASE FRACTURE;  Surgeon: Betha Loa, MD;  [facility];  Service: Orthopedics;  Laterality: Right;   COLONOSCOPY WITH PROPOFOL N/A 08/25/2021   Procedure: COLONOSCOPY WITH PROPOFOL;  Surgeon: Napoleon Form, MD;  Location[facility];  Service: Gastroenterology;  Laterality: N/A;   ESOPHAGOGASTRODUODENOSCOPY (EGD) WITH PROPOFOL N/A 08/25/2021   Procedure: ESOPHAGOGASTRODUODENOSCOPY (EGD) WITH PROPOFOL;  Surgeon: Napoleon Form, MD;  [facility];  Service: Gastroenterology;  Laterality: N/A;   WISDOM TOOTH EXTRACTION      OB History     Gravida  4   Para  2   Term  2   Preterm  0   AB  1   Living  2      SAB  1   IAB  0   Ectopic  0   Multiple  0   Live Births  2            Home Medications    Prior to Admission medications   Medication Sig Start Date End Date Taking? Authorizing Provider  fluconazole (DIFLUCAN) 150 MG  tablet Take 1 tablet (150 mg total) by mouth every 3 (three) days as needed for up to 2 doses. 06/27/22  Yes Hillard Goodwine K, PA-C  ferrous sulfate 325 (65 FE) MG tablet Take 1 tablet (325 mg total) by mouth daily. 08/25/21 08/25/22  Dorcas Carrow, MD  omeprazole (PRILOSEC OTC) 20 MG tablet Take 1 tablet (20 mg total) by mouth 2 (two) times daily. 08/25/21 11/23/21  Dorcas Carrow, MD    Family History Family History  Problem Relation Age of Onset   Asthma Sister    Hearing loss Neg Hx    Colon cancer Neg Hx    Esophageal cancer Neg Hx    Stomach cancer Neg Hx     Social History Social  History   Tobacco Use   Smoking status: Some Days    Types: Cigarettes, Cigars   Smokeless tobacco: Never   Tobacco comments:    Black and off, on and off 2 years  Vaping Use   Vaping Use: Never used  Substance Use Topics   Alcohol use: No   Drug use: No     Allergies   Patient has no known allergies.   Review of Systems Review of Systems  Constitutional:  Positive for activity change. Negative for appetite change, fatigue and fever.  Gastrointestinal:  Negative for abdominal pain, diarrhea, nausea and vomiting.  Genitourinary:  Positive for vaginal discharge and vaginal pain (irritation). Negative for dysuria, frequency, pelvic pain, urgency and vaginal bleeding.     Physical Exam Triage Vital Signs ED Triage Vitals [06/27/22 1445]  Enc Vitals Group     BP 104/74     Pulse Rate 96     Resp 16     Temp 97.8 F (36.6 C)     Temp Source Oral     SpO2 98 %     Weight      Height      Head Circumference      Peak Flow      Pain Score      Pain Loc      Pain Edu?      Excl. in [place]?    No data found.  Updated Vital Signs BP 104/74 (BP Location: Left Arm)   Pulse 96   Temp 97.8 F (36.6 C) (Oral)   Resp 16   LMP 05/29/2022 (Approximate)   SpO2 98%   Visual Acuity Right Eye Distance:   Left Eye Distance:   Bilateral Distance:    Right Eye Near:   Left Eye Near:    Bilateral Near:     Physical Exam Vitals reviewed.  Constitutional:      General: She is awake. She is not in acute distress.    Appearance: Normal appearance. She is well-developed. She is not ill-appearing.     Comments: Very pleasant female appears stated age in no acute distress sitting comfortably in exam room  HENT:     Head: Normocephalic and atraumatic.  Cardiovascular:     Rate and Rhythm: Normal rate and regular rhythm.     Heart sounds: Normal heart sounds, S1 normal and S2 normal. No murmur heard. Pulmonary:     Effort: Pulmonary effort is normal.     Breath sounds: Normal  breath sounds. No wheezing, rhonchi or rales.     Comments: Clear to auscultation bilaterally Abdominal:     General: Bowel sounds are normal.     Palpations: Abdomen is soft.     Tenderness: There is no  abdominal tenderness. There is no right CVA tenderness, left CVA tenderness, guarding or rebound.     Comments: Benign abdominal exam  Genitourinary:    Comments: Exam deferred Psychiatric:        Behavior: Behavior is cooperative.      UC Treatments / Results  Labs (all labs ordered are listed, but only abnormal results are displayed) Labs Reviewed  HIV ANTIBODY (ROUTINE TESTING W REFLEX)  RPR  HEPATITIS C ANTIBODY  CERVICOVAGINAL ANCILLARY ONLY    EKG   Radiology No results found.  Procedures Procedures (including critical care time)  Medications Ordered in UC Medications - No data to display  Initial Impression / Assessment and Plan / UC Course  I have reviewed the triage vital signs and the nursing notes.  Pertinent labs & imaging results that were available during my care of the patient were reviewed by me and considered in my medical decision making (see chart for details).     Patient is well-appearing, afebrile, nontoxic, nontachycardic.  Vital signs and physical exam are reassuring with no indication for emergent evaluation or imaging.  Will empirically treat for yeast vaginitis given clinical presentation.  2 doses of Diflucan were sent to be spaced 72 hours apart.  Recommended that she use hypoallergenic soaps and detergents and wear loosefitting cotton underwear.  STI swab was collected and we will contact her if we need to arrange additional treatment based on swab results.  Testing for HIV/hepatitis/syphilis was also obtained and we will contact her if we need to arrange any treatment based on these results.  We did discuss potential utility of using boric acid to manage chronic BV and encouraged her to research this and discuss this further with her OB/GYN  at her next routine appointment.  Discussed that if she has any worsening or changing symptoms including abdominal pain, pelvic pain, fever, nausea, vomiting she needs to be seen immediately.  Strict return precautions given.  Final Clinical Impressions(s) / UC Diagnoses   Final diagnoses:  Acute vaginitis  Vaginal discharge  Screening examination for STI     Discharge Instructions      We are treating you for yeast.  Take fluconazole today and a second dose in 3 days if your symptoms or not resolved.  We will contact you if your swab or blood work is abnormal we will need to arrange additional treatment.  Abstain from sex until you receive your results.  Wear loosefitting cotton underwear and use hypoallergenic soaps and detergents.  If you have any worsening or changing symptoms including abdominal pain, pelvic pain, fever, nausea, vomiting you should be seen immediately.     ED Prescriptions     Medication Sig Dispense Auth. Provider   fluconazole (DIFLUCAN) 150 MG tablet Take 1 tablet (150 mg total) by mouth every 3 (three) days as needed for up to 2 doses. 2 tablet Nolan Lasser, Noberto Retort, PA-C      PDMP not reviewed this encounter.   Jeani Hawking, PA-C 06/27/22 1500

## 2022-06-28 ENCOUNTER — Telehealth (HOSPITAL_COMMUNITY): Payer: Self-pay | Admitting: Emergency Medicine

## 2022-06-28 LAB — CERVICOVAGINAL ANCILLARY ONLY
Bacterial Vaginitis (gardnerella): POSITIVE — AB
Candida Glabrata: NEGATIVE
Candida Vaginitis: POSITIVE — AB
Chlamydia: NEGATIVE
Comment: NEGATIVE
Comment: NEGATIVE
Comment: NEGATIVE
Comment: NEGATIVE
Comment: NEGATIVE
Comment: NORMAL
Neisseria Gonorrhea: NEGATIVE
Trichomonas: NEGATIVE

## 2022-06-28 LAB — RPR: RPR Ser Ql: NONREACTIVE

## 2022-06-28 MED ORDER — METRONIDAZOLE 500 MG PO TABS: 500.0000 mg | ORAL_TABLET | Freq: Two times a day (BID) | ORAL | 0 refills | Status: DC

## 2023-03-15 ENCOUNTER — Ambulatory Visit (HOSPITAL_COMMUNITY)
Admission: RE | Admit: 2023-03-15 | Discharge: 2023-03-15 | Disposition: A | Discharge status: Home / Self Care | Payer: Medicaid Other | Source: Ambulatory Visit

## 2023-03-15 ENCOUNTER — Encounter (HOSPITAL_COMMUNITY): Payer: Self-pay

## 2023-03-15 VITALS — BP 112/71 | HR 86 | Temp 98.1°F | Resp 16 | Ht 65.0 in | Wt 117.0 lb

## 2023-03-15 DIAGNOSIS — R6889 Other general symptoms and signs: Secondary | ICD-10-CM

## 2023-03-15 DIAGNOSIS — Z20828 Contact with and (suspected) exposure to other viral communicable diseases: Secondary | ICD-10-CM

## 2023-03-15 MED ORDER — OSELTAMIVIR PHOSPHATE 75 MG PO CAPS: 75.0000 mg | ORAL_CAPSULE | Freq: Two times a day (BID) | ORAL | 0 refills | Status: DC

## 2023-03-15 NOTE — ED Provider Notes (Signed)
[facility]    CSN: [id number] Arrival date & time: 03/15/23  1042      History   Chief Complaint Chief Complaint  Patient presents with   Fever    I've had a up and down fever ,chills headache and sore throat - Entered by patient    HPI Kimberly Gibson is a 28 y.o. female who presents with flu like symptoms and fever up to 101 in the pst 2 days. Her daughter had flu A last week. Denies GI symptoms. Has been using OTC cold meds and is helping with  rhinitis and cough    Past Medical History:  Diagnosis Date   BV (bacterial vaginosis)    Enlarged pituitary gland (HCC)    per pt report   GERD (gastroesophageal reflux disease)    Microcytic anemia    Normal pregnancy in multigravida, antepartum 12/11/2017   SVD (spontaneous vaginal delivery) 12/12/2017    Patient Active Problem List   Diagnosis Date Noted   Esophageal dysphagia    Menorrhagia 08/25/2021   Symptomatic anemia 08/25/2021   Anemia 08/24/2021   Normal pregnancy in multigravida in third trimester 12/12/2017   SVD (spontaneous vaginal delivery) 12/12/2017   Normal pregnancy in multigravida, antepartum 12/11/2017   No leakage of amniotic fluid into vagina 11/27/2017   Adjustment disorder with mixed anxiety and depressed mood 08/28/2015   GERD (gastroesophageal reflux disease) 08/27/2015   Overdose of iron or iron compound 08/27/2015   Microcytic anemia 08/27/2015   Normal pregnancy, first 09/18/2013    Past Surgical History:  Procedure Laterality Date   BIOPSY  08/25/2021   Procedure: BIOPSY;  Surgeon: Napoleon Form, MD;  Location: [facility];  Service: Gastroenterology;;   CLOSED REDUCTION METACARPAL WITH PERCUTANEOUS PINNING Right 08/08/2019   Procedure: CLOSED REDUCTION WITH PERCUTANEOUS PINNING RIGHT SMALL METACARPAL BASE FRACTURE;  Surgeon: Betha Loa, MD;  [facility];  Service: Orthopedics;  Laterality: Right;   COLONOSCOPY WITH PROPOFOL N/A 08/25/2021   Procedure: COLONOSCOPY  WITH PROPOFOL;  Surgeon: Napoleon Form, MD;  Location: [facility];  Service: Gastroenterology;  Laterality: N/A;   ESOPHAGOGASTRODUODENOSCOPY (EGD) WITH PROPOFOL N/A 08/25/2021   Procedure: ESOPHAGOGASTRODUODENOSCOPY (EGD) WITH PROPOFOL;  Surgeon: Napoleon Form, MD;  Location: [facility];  Service: Gastroenterology;  Laterality: N/A;   WISDOM TOOTH EXTRACTION      OB History     Gravida  4   Para  2   Term  2   Preterm  0   AB  1   Living  2      SAB  1   IAB  0   Ectopic  0   Multiple  0   Live Births  2            Home Medications    Prior to Admission medications   Medication Sig Start Date End Date Taking? Authorizing Provider  escitalopram (LEXAPRO) 10 MG tablet Take 1 tablet by mouth daily.    [provider]    Family History Family History  Problem Relation Age of Onset   Asthma Sister    Hearing loss Neg Hx    Colon cancer Neg Hx    Esophageal cancer Neg Hx    Stomach cancer Neg Hx     Social History Social History   Tobacco Use   Smoking status: Some Days    Types: Cigarettes, Cigars   Smokeless tobacco: Never   Tobacco comments:    Black and off, on  and off 2 years  Vaping Use   Vaping status: Never Used  Substance Use Topics   Alcohol use: No   Drug use: No     Allergies   Patient has no known allergies.   Review of Systems Review of Systems  As noted in HPI Physical Exam Triage Vital Signs ED Triage Vitals [03/15/23 1151]  Encounter Vitals Group     BP 112/71     Systolic BP Percentile      Diastolic BP Percentile      Pulse Rate 86     Resp 16     Temp 98.1 F (36.7 C)     Temp Source Oral     SpO2 100 %     Weight 117 lb (53.1 kg)     Height 5\' 5"  (1.651 m)     Head Circumference      Peak Flow      Pain Score 4     Pain Loc      Pain Education      Exclude from Growth Chart    No data found.  Updated Vital Signs BP 112/71 (BP Location: Right Arm)   Pulse 86   Temp 98.1 F  (36.7 C) (Oral)   Resp 16   Ht 5\' 5"  (1.651 m)   Wt 117 lb (53.1 kg)   LMP 03/08/2023 (Exact Date)   SpO2 100%   BMI 19.47 kg/m   Visual Acuity Right Eye Distance:   Left Eye Distance:   Bilateral Distance:    Right Eye Near:   Left Eye Near:    Bilateral Near:     Physical Exam   UC Treatments / Results  Labs (all labs ordered are listed, but only abnormal results are displayed) Labs Reviewed - No data to display  EKG   Radiology No results found.  Procedures Procedures (including critical care time)  Medications Ordered in UC Medications - No data to display  Initial Impression / Assessment and Plan / UC Course  I have reviewed the triage vital signs and the nursing notes.  Flu like symptoms with exposure to Flu A  I placed her on Tamiflu as noted.     Final Clinical Impressions(s) / UC Diagnoses   Final diagnoses:  None   Discharge Instructions   None    ED Prescriptions   None    PDMP not reviewed this encounter.   Garey Ham, Cordelia Poche 03/15/23 1218

## 2023-03-15 NOTE — ED Triage Notes (Signed)
Patient here today with c/o fever, chills, body aches, nausea, headache, and ST X 2 days. She has taken Tylenol with some relief. Her daughter was sick with the flu last week.

## 2023-04-26 ENCOUNTER — Telehealth: Payer: Medicaid Other | Admitting: Family Medicine

## 2023-04-26 DIAGNOSIS — B3731 Acute candidiasis of vulva and vagina: Secondary | ICD-10-CM

## 2023-04-26 MED ORDER — FLUCONAZOLE 150 MG PO TABS
150.0000 mg | ORAL_TABLET | Freq: Every day | ORAL | 0 refills | Status: DC
Start: 2023-04-26 — End: 2023-07-24

## 2023-04-26 NOTE — Progress Notes (Signed)

## 2023-05-16 ENCOUNTER — Other Ambulatory Visit: Payer: Self-pay

## 2023-05-16 ENCOUNTER — Emergency Department (HOSPITAL_COMMUNITY)
Admission: EM | Admit: 2023-05-16 | Discharge: 2023-05-16 | Discharge status: Against Medical Advice | Attending: Emergency Medicine | Admitting: Emergency Medicine

## 2023-05-16 ENCOUNTER — Encounter (HOSPITAL_COMMUNITY): Payer: Self-pay | Admitting: Emergency Medicine

## 2023-05-16 DIAGNOSIS — M545 Low back pain, unspecified: Secondary | ICD-10-CM | POA: Insufficient documentation

## 2023-05-16 DIAGNOSIS — M546 Pain in thoracic spine: Secondary | ICD-10-CM | POA: Diagnosis not present

## 2023-05-16 DIAGNOSIS — Y92410 Unspecified street and highway as the place of occurrence of the external cause: Secondary | ICD-10-CM | POA: Insufficient documentation
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
## 2023-05-16 NOTE — ED Notes (Signed)
 Called  multiple times earlier for urine sample no answer. Called again at this time for vitals no answer. OTF

## 2023-05-18 ENCOUNTER — Telehealth: Admitting: Physician Assistant

## 2023-05-18 DIAGNOSIS — M545 Low back pain, unspecified: Secondary | ICD-10-CM
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
## 2023-07-24 ENCOUNTER — Telehealth: Admitting: Physician Assistant

## 2023-07-24 DIAGNOSIS — B3731 Acute candidiasis of vulva and vagina: Secondary | ICD-10-CM

## 2023-07-24 MED ORDER — FLUCONAZOLE 150 MG PO TABS
ORAL_TABLET | ORAL | 0 refills | Status: DC
Start: 2023-07-24 — End: 2024-01-12

## 2023-07-24 NOTE — Progress Notes (Signed)
 I have spent 5 minutes in review of e-visit questionnaire, review and updating patient chart, medical decision making and response to patient.   Piedad Climes, PA-C

## 2023-07-24 NOTE — Progress Notes (Signed)

## 2024-01-12 ENCOUNTER — Ambulatory Visit (HOSPITAL_COMMUNITY)
Admission: EM | Admit: 2024-01-12 | Discharge: 2024-01-12 | Disposition: A | Discharge status: Home / Self Care | Payer: Self-pay

## 2024-01-12 ENCOUNTER — Encounter (HOSPITAL_COMMUNITY): Payer: Self-pay

## 2024-01-12 ENCOUNTER — Ambulatory Visit (HOSPITAL_COMMUNITY)
Admission: RE | Admit: 2024-01-12 | Discharge: 2024-01-12 | Disposition: A | Discharge status: Home / Self Care | Payer: Self-pay | Source: Ambulatory Visit | Attending: Family Medicine | Admitting: Family Medicine

## 2024-01-12 VITALS — BP 109/72 | HR 92 | Temp 98.2°F | Resp 18

## 2024-01-12 DIAGNOSIS — Z3202 Encounter for pregnancy test, result negative: Secondary | ICD-10-CM

## 2024-01-12 DIAGNOSIS — N939 Abnormal uterine and vaginal bleeding, unspecified: Secondary | ICD-10-CM | POA: Insufficient documentation

## 2024-01-12 LAB — POCT URINE PREGNANCY: Preg Test, Ur: NEGATIVE

## 2024-01-12 MED ORDER — MEDROXYPROGESTERONE ACETATE 10 MG PO TABS: 10.0000 mg | ORAL_TABLET | Freq: Every day | ORAL | 0 refills | Status: AC

## 2024-01-12 NOTE — Discharge Instructions (Signed)
 Pregnancy test was negative  Take medroxyprogesterone 10 mg--1 tablet daily for 7 days.  Hopefully this medication will help you stop bleeding; then you should expect a withdrawal bleed 3 to 4 days after you finish this medication.  Please follow-up with GYN  You can use the QR code/website at the back of the summary paperwork to schedule yourself a new patient appointment with primary care

## 2024-01-12 NOTE — ED Provider Notes (Addendum)
 [facility]    CSN: [id number] Arrival date & time: 01/12/24  0940      History   Chief Complaint Chief Complaint  Patient presents with   Vaginal Bleeding    My period started oct25-29 I have regular menstrual cycles. I've been spotting/light bleeding for the last 7 days with light cramps. I've never had this happen with any of my cycles and it's concerning . - Entered by patient    HPI Kimberly Gibson is a 28 y.o. female.    Vaginal Bleeding  Here for spotting has been going on since November 8.  She had a normal period October 25 through 29, and then stopped bleeding until November 8.  No pelvic pain or cramping when I ask about the symptoms, but I see that she put that she had had some light cramps when she made her appointment.  No dysuria.  No fever  NKDA  She is not on any birth control    Past Medical History:  Diagnosis Date   BV (bacterial vaginosis)    Enlarged pituitary gland    per pt report   GERD (gastroesophageal reflux disease)    Microcytic anemia    Normal pregnancy in multigravida, antepartum 12/11/2017   SVD (spontaneous vaginal delivery) 12/12/2017    Patient Active Problem List   Diagnosis Date Noted   Esophageal dysphagia    Menorrhagia 08/25/2021   Symptomatic anemia 08/25/2021   Anemia 08/24/2021   Normal pregnancy in multigravida in third trimester 12/12/2017   SVD (spontaneous vaginal delivery) 12/12/2017   Normal pregnancy in multigravida, antepartum 12/11/2017   No leakage of amniotic fluid into vagina 11/27/2017   Adjustment disorder with mixed anxiety and depressed mood 08/28/2015   GERD (gastroesophageal reflux disease) 08/27/2015   Overdose of iron  or iron  compound 08/27/2015   Microcytic anemia 08/27/2015   Normal pregnancy, first 09/18/2013    Past Surgical History:  Procedure Laterality Date   BIOPSY  08/25/2021   Procedure: BIOPSY;  Surgeon: Shila Gustav GAILS, MD;  Location: [facility];  Service:  Gastroenterology;;   CLOSED REDUCTION METACARPAL WITH PERCUTANEOUS PINNING Right 08/08/2019   Procedure: CLOSED REDUCTION WITH PERCUTANEOUS PINNING RIGHT SMALL METACARPAL BASE FRACTURE;  Surgeon: Murrell Drivers, MD;  Location: [facility];  Service: Orthopedics;  Laterality: Right;   COLONOSCOPY WITH PROPOFOL  N/A 08/25/2021   Procedure: COLONOSCOPY WITH PROPOFOL ;  Surgeon: Shila Gustav GAILS, MD;  Location: [facility];  Service: Gastroenterology;  Laterality: N/A;   ESOPHAGOGASTRODUODENOSCOPY (EGD) WITH PROPOFOL  N/A 08/25/2021   Procedure: ESOPHAGOGASTRODUODENOSCOPY (EGD) WITH PROPOFOL ;  Surgeon: Shila Gustav GAILS, MD;  Location: [facility];  Service: Gastroenterology;  Laterality: N/A;   WISDOM TOOTH EXTRACTION      OB History     Gravida  4   Para  2   Term  2   Preterm  0   AB  1   Living  2      SAB  1   IAB  0   Ectopic  0   Multiple  0   Live Births  2            Home Medications    Prior to Admission medications   Medication Sig Start Date End Date Taking? Authorizing Provider  escitalopram (LEXAPRO) 10 MG tablet Take 1 tablet by mouth daily.   Yes [provider]  medroxyPROGESTERone (PROVERA) 10 MG tablet Take 1 tablet (10 mg total) by mouth daily for 10 days. 01/12/24 01/22/24 Yes Vonna Males  K, MD    Family History Family History  Problem Relation Age of Onset   Asthma Sister    Hearing loss Neg Hx    Colon cancer Neg Hx    Esophageal cancer Neg Hx    Stomach cancer Neg Hx     Social History Social History   Tobacco Use   Smoking status: Some Days    Types: Cigarettes, Cigars   Smokeless tobacco: Never   Tobacco comments:    Black and off, on and off 2 years  Vaping Use   Vaping status: Never Used  Substance Use Topics   Alcohol use: No   Drug use: No     Allergies   Patient has no known allergies.   Review of Systems Review of Systems  Genitourinary:  Positive for vaginal bleeding.     Physical Exam Triage  Vital Signs ED Triage Vitals  Encounter Vitals Group     BP 01/12/24 1019 109/72     Girls Systolic BP Percentile --      Girls Diastolic BP Percentile --      Boys Systolic BP Percentile --      Boys Diastolic BP Percentile --      Pulse Rate 01/12/24 1019 92     Resp 01/12/24 1019 18     Temp 01/12/24 1019 98.2 F (36.8 C)     Temp Source 01/12/24 1019 Oral     SpO2 01/12/24 1019 97 %     Weight --      Height --      Head Circumference --      Peak Flow --      Pain Score 01/12/24 1020 0     Pain Loc --      Pain Education --      Exclude from Growth Chart --    No data found.  Updated Vital Signs BP 109/72 (BP Location: Left Arm)   Pulse 92   Temp 98.2 F (36.8 C) (Oral)   Resp 18   LMP 12/22/2023 (Approximate)   SpO2 97%   Visual Acuity Right Eye Distance:   Left Eye Distance:   Bilateral Distance:    Right Eye Near:   Left Eye Near:    Bilateral Near:     Physical Exam Vitals reviewed.  Constitutional:      General: She is not in acute distress.    Appearance: She is not toxic-appearing.  HENT:     Mouth/Throat:     Mouth: Mucous membranes are moist.  Cardiovascular:     Rate and Rhythm: Normal rate and regular rhythm.     Heart sounds: No murmur heard. Pulmonary:     Effort: Pulmonary effort is normal.     Breath sounds: Normal breath sounds.  Abdominal:     Palpations: Abdomen is soft.     Tenderness: There is no abdominal tenderness.  Skin:    Coloration: Skin is not pale.  Neurological:     Mental Status: She is alert and oriented to person, place, and time.  Psychiatric:        Behavior: Behavior normal.      UC Treatments / Results  Labs (all labs ordered are listed, but only abnormal results are displayed) Labs Reviewed  POCT URINE PREGNANCY  CERVICOVAGINAL ANCILLARY ONLY    EKG   Radiology No results found.  Procedures Procedures (including critical care time)  Medications Ordered in UC Medications - No data to  display  Initial  Impression / Assessment and Plan / UC Course  I have reviewed the triage vital signs and the nursing notes.  Pertinent labs & imaging results that were available during my care of the patient were reviewed by me and considered in my medical decision making (see chart for details).     UPT is negative.  Progesterone is sent in.  She is going to follow-up with OB/GYN.  She is given information how to set up an appointment with primary care also. Final Clinical Impressions(s) / UC Diagnoses   Final diagnoses:  Abnormal uterine bleeding     Discharge Instructions      Pregnancy test was negative  Take medroxyprogesterone 10 mg--1 tablet daily for 7 days.  Hopefully this medication will help you stop bleeding; then you should expect a withdrawal bleed 3 to 4 days after you finish this medication.  Please follow-up with GYN  You can use the QR code/website at the back of the summary paperwork to schedule yourself a new patient appointment with primary care      ED Prescriptions     Medication Sig Dispense Auth. Provider   medroxyPROGESTERone (PROVERA) 10 MG tablet Take 1 tablet (10 mg total) by mouth daily for 10 days. 10 tablet Vonna Rishawn Walck K, MD      PDMP not reviewed this encounter.   Vonna Sharlet POUR, MD 01/12/24 1116    Vonna Sharlet POUR, MD 01/12/24 [phone]

## 2024-01-12 NOTE — ED Triage Notes (Addendum)
 C/o vaginal spotting/ light bleeding since her last period on 12/22/2023. Denies taking any new birth control or Plan B pills.

## 2024-01-14 LAB — CERVICOVAGINAL ANCILLARY ONLY
Chlamydia: NEGATIVE
Comment: NEGATIVE
Comment: NEGATIVE
Comment: NORMAL
Neisseria Gonorrhea: NEGATIVE
Trichomonas: NEGATIVE
